# Patient Record
Sex: Male | Born: 1955 | Race: White | Hispanic: No | Marital: Single | Smoking: Current every day smoker
Health system: Southern US, Community
[De-identification: ages and names within clinical notes are randomized; demographics above are authoritative.]

## PROBLEM LIST (undated history)

## (undated) DIAGNOSIS — C801 Malignant (primary) neoplasm, unspecified: Secondary | ICD-10-CM

## (undated) HISTORY — PX: NO PAST SURGERIES: SHX2092

---

## 2018-11-29 ENCOUNTER — Other Ambulatory Visit: Payer: Self-pay

## 2018-11-29 ENCOUNTER — Emergency Department (HOSPITAL_COMMUNITY): Payer: Self-pay

## 2018-11-29 ENCOUNTER — Encounter (HOSPITAL_COMMUNITY): Payer: Self-pay

## 2018-11-29 ENCOUNTER — Inpatient Hospital Stay (HOSPITAL_COMMUNITY)
Admission: EM | Admit: 2018-11-29 | Discharge: 2018-12-02 | DRG: 436 | Disposition: A | Discharge status: Home / Self Care | Payer: Self-pay | Attending: Internal Medicine | Admitting: Internal Medicine

## 2018-11-29 DIAGNOSIS — N179 Acute kidney failure, unspecified: Secondary | ICD-10-CM | POA: Diagnosis present

## 2018-11-29 DIAGNOSIS — R918 Other nonspecific abnormal finding of lung field: Secondary | ICD-10-CM | POA: Diagnosis present

## 2018-11-29 DIAGNOSIS — C220 Liver cell carcinoma: Secondary | ICD-10-CM | POA: Diagnosis present

## 2018-11-29 DIAGNOSIS — K7030 Alcoholic cirrhosis of liver without ascites: Secondary | ICD-10-CM | POA: Diagnosis present

## 2018-11-29 DIAGNOSIS — C7951 Secondary malignant neoplasm of bone: Secondary | ICD-10-CM

## 2018-11-29 DIAGNOSIS — C782 Secondary malignant neoplasm of pleura: Secondary | ICD-10-CM | POA: Diagnosis present

## 2018-11-29 DIAGNOSIS — F1010 Alcohol abuse, uncomplicated: Secondary | ICD-10-CM | POA: Diagnosis present

## 2018-11-29 DIAGNOSIS — C799 Secondary malignant neoplasm of unspecified site: Secondary | ICD-10-CM

## 2018-11-29 DIAGNOSIS — R1013 Epigastric pain: Secondary | ICD-10-CM

## 2018-11-29 DIAGNOSIS — N183 Chronic kidney disease, stage 3 (moderate): Secondary | ICD-10-CM | POA: Diagnosis present

## 2018-11-29 DIAGNOSIS — F1011 Alcohol abuse, in remission: Secondary | ICD-10-CM | POA: Diagnosis present

## 2018-11-29 DIAGNOSIS — K769 Liver disease, unspecified: Secondary | ICD-10-CM

## 2018-11-29 DIAGNOSIS — C771 Secondary and unspecified malignant neoplasm of intrathoracic lymph nodes: Secondary | ICD-10-CM | POA: Diagnosis present

## 2018-11-29 DIAGNOSIS — C229 Malignant neoplasm of liver, not specified as primary or secondary: Principal | ICD-10-CM | POA: Diagnosis present

## 2018-11-29 DIAGNOSIS — C787 Secondary malignant neoplasm of liver and intrahepatic bile duct: Secondary | ICD-10-CM

## 2018-11-29 DIAGNOSIS — F17210 Nicotine dependence, cigarettes, uncomplicated: Secondary | ICD-10-CM | POA: Diagnosis present

## 2018-11-29 DIAGNOSIS — K5900 Constipation, unspecified: Secondary | ICD-10-CM | POA: Diagnosis present

## 2018-11-29 DIAGNOSIS — E876 Hypokalemia: Secondary | ICD-10-CM | POA: Diagnosis present

## 2018-11-29 HISTORY — DX: Malignant (primary) neoplasm, unspecified: C80.1

## 2018-11-29 LAB — CBC
HCT: 44.1 % (ref 39.0–52.0)
Hemoglobin: 14.6 g/dL (ref 13.0–17.0)
MCH: 31.3 pg (ref 26.0–34.0)
MCHC: 33.1 g/dL (ref 30.0–36.0)
MCV: 94.4 fL (ref 80.0–100.0)
Platelets: 191 10*3/uL (ref 150–400)
RBC: 4.67 MIL/uL (ref 4.22–5.81)
RDW: 12 % (ref 11.5–15.5)
WBC: 6.3 10*3/uL (ref 4.0–10.5)
nRBC: 0 % (ref 0.0–0.2)

## 2018-11-29 LAB — COMPREHENSIVE METABOLIC PANEL
ALT: 101 U/L — ABNORMAL HIGH (ref 0–44)
ANION GAP: 11 (ref 5–15)
AST: 94 U/L — ABNORMAL HIGH (ref 15–41)
Albumin: 3.7 g/dL (ref 3.5–5.0)
Alkaline Phosphatase: 286 U/L — ABNORMAL HIGH (ref 38–126)
BUN: 24 mg/dL — ABNORMAL HIGH (ref 8–23)
CO2: 23 mmol/L (ref 22–32)
Calcium: 9.4 mg/dL (ref 8.9–10.3)
Chloride: 101 mmol/L (ref 98–111)
Creatinine, Ser: 1.32 mg/dL — ABNORMAL HIGH (ref 0.61–1.24)
GFR calc Af Amer: >60 mL/min (ref >60)
GFR calc non Af Amer: 57 mL/min — ABNORMAL LOW (ref >60)
Glucose, Bld: 106 mg/dL — ABNORMAL HIGH (ref 70–99)
Potassium: 3.4 mmol/L — ABNORMAL LOW (ref 3.5–5.1)
Sodium: 135 mmol/L (ref 135–145)
Total Bilirubin: 1.3 mg/dL — ABNORMAL HIGH (ref 0.3–1.2)
Total Protein: 7.1 g/dL (ref 6.5–8.1)

## 2018-11-29 LAB — URINALYSIS, ROUTINE W REFLEX MICROSCOPIC
Bacteria, UA: NONE SEEN
Bilirubin Urine: NEGATIVE
Glucose, UA: 50 mg/dL — AB
HGB URINE DIPSTICK: NEGATIVE
Ketones, ur: NEGATIVE mg/dL
LEUKOCYTE UA: NEGATIVE
Nitrite: NEGATIVE
Protein, ur: 100 mg/dL — AB
Specific Gravity, Urine: 1.015 (ref 1.005–1.030)
pH: 5 (ref 5.0–8.0)

## 2018-11-29 LAB — CBC WITH DIFFERENTIAL/PLATELET
Abs Immature Granulocytes: 0.01 10*3/uL (ref 0.00–0.07)
Basophils Absolute: 0 10*3/uL (ref 0.0–0.1)
Basophils Relative: 0 %
EOS ABS: 0 10*3/uL (ref 0.0–0.5)
EOS PCT: 1 %
HEMATOCRIT: 45.1 % (ref 39.0–52.0)
Hemoglobin: 15 g/dL (ref 13.0–17.0)
Immature Granulocytes: 0 %
Lymphocytes Relative: 17 %
Lymphs Abs: 1.1 10*3/uL (ref 0.7–4.0)
MCH: 31.3 pg (ref 26.0–34.0)
MCHC: 33.3 g/dL (ref 30.0–36.0)
MCV: 94.2 fL (ref 80.0–100.0)
Monocytes Absolute: 0.7 10*3/uL (ref 0.1–1.0)
Monocytes Relative: 11 %
Neutro Abs: 4.6 10*3/uL (ref 1.7–7.7)
Neutrophils Relative %: 71 %
Platelets: 187 10*3/uL (ref 150–400)
RBC: 4.79 MIL/uL (ref 4.22–5.81)
RDW: 11.9 % (ref 11.5–15.5)
WBC: 6.5 10*3/uL (ref 4.0–10.5)
nRBC: 0 % (ref 0.0–0.2)

## 2018-11-29 LAB — CREATININE, SERUM
Creatinine, Ser: 1.25 mg/dL — ABNORMAL HIGH (ref 0.61–1.24)
GFR calc Af Amer: >60 mL/min (ref >60)
GFR calc non Af Amer: >60 mL/min (ref >60)

## 2018-11-29 LAB — LIPASE, BLOOD: LIPASE: 146 U/L — AB (ref 11–51)

## 2018-11-29 MED ORDER — OXYCODONE HCL 5 MG PO TABS
5.0000 mg | ORAL_TABLET | ORAL | Status: DC | PRN
  Administered 2018-11-30 (×2): 5 mg via ORAL
  Filled 2018-11-29 (×3): qty 1

## 2018-11-29 MED ORDER — POLYETHYLENE GLYCOL 3350 17 G PO PACK: 17.0000 g | PACK | Freq: Every day | ORAL | Status: DC | PRN

## 2018-11-29 MED ORDER — TRAZODONE HCL 50 MG PO TABS: 50.0000 mg | ORAL_TABLET | Freq: Every evening | ORAL | Status: DC | PRN

## 2018-11-29 MED ORDER — POTASSIUM CHLORIDE CRYS ER 20 MEQ PO TBCR
20.0000 meq | EXTENDED_RELEASE_TABLET | Freq: Once | ORAL | Status: AC
  Administered 2018-11-29: 20 meq via ORAL
  Filled 2018-11-29: qty 1

## 2018-11-29 MED ORDER — IOHEXOL 300 MG/ML  SOLN
100.0000 mL | Freq: Once | INTRAMUSCULAR | Status: AC | PRN
  Administered 2018-11-29: 100 mL via INTRAVENOUS

## 2018-11-29 MED ORDER — LACTATED RINGERS IV SOLN
INTRAVENOUS | Status: DC
  Administered 2018-11-29 – 2018-12-02 (×5): via INTRAVENOUS

## 2018-11-29 MED ORDER — SODIUM CHLORIDE 0.9 % IV BOLUS
1000.0000 mL | Freq: Once | INTRAVENOUS | Status: AC
  Administered 2018-11-29: 1000 mL via INTRAVENOUS

## 2018-11-29 MED ORDER — MORPHINE SULFATE (PF) 4 MG/ML IV SOLN
4.0000 mg | Freq: Once | INTRAVENOUS | Status: AC
  Administered 2018-11-29: 4 mg via INTRAVENOUS
  Filled 2018-11-29: qty 1

## 2018-11-29 MED ORDER — SODIUM CHLORIDE 0.9 % IV SOLN
INTRAVENOUS | Status: DC
  Administered 2018-11-29: 12:00:00 via INTRAVENOUS

## 2018-11-29 MED ORDER — MORPHINE SULFATE (PF) 2 MG/ML IV SOLN
1.0000 mg | INTRAVENOUS | Status: DC | PRN
  Administered 2018-11-29: 3 mg via INTRAVENOUS
  Administered 2018-11-30 – 2018-12-01 (×4): 2 mg via INTRAVENOUS
  Administered 2018-12-01 (×3): 3 mg via INTRAVENOUS
  Administered 2018-12-01 (×2): 2 mg via INTRAVENOUS
  Administered 2018-12-01 – 2018-12-02 (×5): 3 mg via INTRAVENOUS
  Filled 2018-11-29 (×2): qty 2
  Filled 2018-11-29: qty 1
  Filled 2018-11-29 (×2): qty 2
  Filled 2018-11-29: qty 1
  Filled 2018-11-29 (×2): qty 2
  Filled 2018-11-29: qty 1
  Filled 2018-11-29: qty 2
  Filled 2018-11-29: qty 1
  Filled 2018-11-29 (×2): qty 2
  Filled 2018-11-29 (×2): qty 1

## 2018-11-29 MED ORDER — ONDANSETRON HCL 4 MG/2ML IJ SOLN
4.0000 mg | Freq: Four times a day (QID) | INTRAMUSCULAR | Status: DC | PRN
  Administered 2018-11-30 – 2018-12-01 (×3): 4 mg via INTRAVENOUS
  Filled 2018-11-29 (×3): qty 2

## 2018-11-29 MED ORDER — ENOXAPARIN SODIUM 40 MG/0.4ML ~~LOC~~ SOLN
40.0000 mg | SUBCUTANEOUS | Status: DC
  Administered 2018-11-29 – 2018-12-01 (×3): 40 mg via SUBCUTANEOUS
  Filled 2018-11-29 (×3): qty 0.4

## 2018-11-29 MED ORDER — ONDANSETRON HCL 4 MG PO TABS: 4.0000 mg | ORAL_TABLET | Freq: Four times a day (QID) | ORAL | Status: DC | PRN

## 2018-11-29 MED ORDER — ONDANSETRON HCL 4 MG/2ML IJ SOLN
4.0000 mg | Freq: Once | INTRAMUSCULAR | Status: AC
  Administered 2018-11-29: 4 mg via INTRAVENOUS
  Filled 2018-11-29: qty 2

## 2018-11-29 NOTE — ED Notes (Signed)
Patient is resting comfortably. 

## 2018-11-29 NOTE — ED Notes (Signed)
Pt walking around unit,

## 2018-11-29 NOTE — H&P (Signed)
History and Physical    Levie Wages Schorsch NFA:[id number] DOB: 1956-03-20 DOA: 11/29/2018  PCP: Patient, No Pcp Per Patient coming from: Home  Chief Complaint: Abd pain  HPI: Cody Montoya is a 63 y.o. male with medical history significant of alcohol abuse.  He does not have a primary care provider and does not seek routine medical care.  Around 3 weeks ago, the patient started having epigastric abdominal pain.  It would wax and wane in severity but progressively worsened since onset.  He has had associated nausea and vomiting.  His appetite has decreased and he is lost around 10 pounds.  He stopped drinking when this happened.  He would drink around 7-8 shots of vodka daily in addition to the 2-3 beers.  He has done this for the past 4-5 years.  He does not have any known history of hepatitis B or C and denies taking any medication/supplements routinely.  He denies any prominent vessels, yellowing of the skin/eyes, fevers, shortness of breath, or bowel changes.  The patient has been using ibuprofen for pain with little relief.  ED Course: CT scan shows multifocal hepatocellular carcinoma that is not appear to be metastatic to the liver.  There is also thoracic adenopathy, omental/peritoneal involvement and left pleural effusion suggestive of metastatic disease.  The oncology team was called and recommended admission for work-up.  No mild hypokalemia, elevated liver enzymes and alk phos, and GFR in the stage III range.  Review of Systems: As per HPI otherwise 10 point review of systems negative.   History reviewed. No pertinent past medical history.  History reviewed. No pertinent surgical history.   reports that he has been smoking cigarettes. He has a 45.00 pack-year smoking history. He has never used smokeless tobacco. He reports previous alcohol use. He reports previous drug use.  No Known Allergies  History reviewed. No pertinent family history.   Prior to Admission  medications   Medication Sig Start Date End Date Taking? Authorizing Provider  ibuprofen (ADVIL,MOTRIN) 200 MG tablet Take 800 mg by mouth every 6 (six) hours as needed for fever or moderate pain.   Yes [provider]    Physical Exam: Vitals:   11/29/18 1245 11/29/18 1300 11/29/18 1315 11/29/18 1530  BP: 136/69 135/66 138/70 117/90  Pulse: 66 68 62 67  Resp: (!) 22 19 (!) 24 12  Temp:      TempSrc:      SpO2: 96% 97% 97% 98%  Weight:      Height:       Constitutional: NAD, calm, comfortable Eyes: PERRL, lids and conjunctivae normal ENMT: Mucous membranes are moist. Posterior pharynx clear of any exudate or lesions.Normal dentition.  Neck: normal, supple, no masses, no thyromegaly Respiratory: clear to auscultation bilaterally, no wheezing, no crackles. Normal respiratory effort. No accessory muscle use.  Cardiovascular: RRR. No extremity edema. 2+ pedal pulses. No carotid bruits.  Abdomen: + Epigastric tenderness, mild distention, no masses palpated. No hepatosplenomegaly, though he had difficulty tolerating deeper palpation.  Bowel sounds positive.  Musculoskeletal: no clubbing / cyanosis. No joint deformity upper and lower extremities. Good ROM, no contractures. Normal muscle tone.  Skin: no rashes, lesions, ulcers, icterus.  Neurologic: CN 2-12 grossly intact. Sensation intact, DTR normal. Strength 5/5 in all 4.  Psychiatric: Normal judgment and insight. Alert and oriented x 3. Normal mood.   Labs on Admission: I have personally reviewed following labs and imaging studies  CBC: Recent Labs  Lab 11/29/18 0955  WBC  6.5  NEUTROABS 4.6  HGB 15.0  HCT 45.1  MCV 94.2  PLT 784   Basic Metabolic Panel: Recent Labs  Lab 11/29/18 0955  NA 135  K 3.4*  CL 101  CO2 23  GLUCOSE 106*  BUN 24*  CREATININE 1.32*  CALCIUM 9.4   GFR: Estimated Creatinine Clearance: 67.2 mL/min (A) (by C-G formula based on SCr of 1.32 mg/dL (H)). Liver Function Tests: Recent Labs    Lab 11/29/18 0955  AST 94*  ALT 101*  ALKPHOS 286*  BILITOT 1.3*  PROT 7.1  ALBUMIN 3.7   Recent Labs  Lab 11/29/18 0955  LIPASE 146*   Urine analysis:    Component Value Date/Time   COLORURINE YELLOW 11/29/2018 0957   APPEARANCEUR CLEAR 11/29/2018 0957   LABSPEC 1.015 11/29/2018 0957   PHURINE 5.0 11/29/2018 0957   GLUCOSEU 50 (A) 11/29/2018 0957   HGBUR NEGATIVE 11/29/2018 0957   BILIRUBINUR NEGATIVE 11/29/2018 0957   KETONESUR NEGATIVE 11/29/2018 0957   PROTEINUR 100 (A) 11/29/2018 0957   NITRITE NEGATIVE 11/29/2018 0957   LEUKOCYTESUR NEGATIVE 11/29/2018 0957   Radiological Exams on Admission: Ct Abdomen Pelvis W Contrast  Result Date: 11/29/2018 CLINICAL DATA:  Upper abdominal pain for several weeks. Smoker. No history of primary malignancy per the medical record. EXAM: CT ABDOMEN AND PELVIS WITH CONTRAST TECHNIQUE: Multidetector CT imaging of the abdomen and pelvis was performed using the standard protocol following bolus administration of intravenous contrast. CONTRAST:  112m OMNIPAQUE IOHEXOL 300 MG/ML  SOLN COMPARISON:  None. FINDINGS: Lower chest: Clear lung bases. Tiny left pleural effusion with pleural irregularity, most apparent on image 3/4. Normal heart size with right coronary artery atherosclerosis. Hepatobiliary: Hepatomegaly at 21.6 cm craniocaudal. Irregular hepatic capsule with caudate and lateral segment left liver lobe enlargement. Heterogeneous density throughout the liver, with more well-defined hypoattenuating lesions including in the high left hepatic lobe at 2.1 cm on image 15/3 and in the central right hepatic lobe at 1.8 cm on the same image. The gallbladder is contracted and appears thick walled. No pericholecystic edema. No biliary duct dilatation. Pancreas: Normal, without mass or ductal dilatation. Spleen: Normal in size, without focal abnormality. Adrenals/Urinary Tract: Left greater than right adrenal thickening. Too small to characterize lesions  in both kidneys. No hydronephrosis. Normal urinary bladder. Stomach/Bowel: Normal stomach, without wall thickening. Scattered colonic diverticula. Normal terminal ileum and appendix. Normal small bowel. Vascular/Lymphatic: Aortic and branch vessel atherosclerosis. Patent portal and splenic veins. No specific evidence of portal venous hypertension. Extensive abdominal adenopathy. Index aortocaval node of 2.2 cm on image 36/3. Porta hepatis adenopathy at 2.4 cm on image 28/3. Retrocrural adenopathy at 1.1 cm on image 20/3. No pelvic sidewall adenopathy. Reproductive: Normal prostate. Other: Small volume abdominopelvic ascites. Perihepatic nodularity may represent small lymph nodes or omental metastasis including at 9 mm on image 43/3. Right inguinal hernia contains fat. Musculoskeletal: Anterior left seventh rib nonacute fracture. Lower thoracic spondylosis. IMPRESSION: 1. 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 Widespread metastatic disease from an otherwise unknown primary is possible but felt less likely. 2. Upper abdominal and lower thoracic adenopathy, most consistent with metastatic disease. 3. Small left pleural effusion with findings suspicious for pleural metastasis. 4. Probable perihepatic omental/peritoneal metastasis. 5. Coronary artery atherosclerosis. Aortic Atherosclerosis (ICD10-I70.0). 6. Small volume abdominopelvic ascites. Electronically Signed   By: KAbigail MiyamotoM.D.   On: 11/29/2018 14:34    EKG: Independently reviewed.  Assessment/Plan Principal Problem:   Hepatocellular carcinoma ([place]  Oncology appreciated  Zofran for nausea  Oxycodone and morphine for moderate and severe pain  Gentle hydration due to poor oral intake and possible increase in creatinine (no baseline)  Ck HIV, Hep C, Hep B today  Active Problems:   History of alcohol abuse  Encourage continued cessation   Hypokalemia  Replace, check BMP in the  morning   DVT prophylaxis: Lovenox Code Status: Full Family Communication: Self Disposition Plan: Home Consults called: Oncology, Dr. Irene Limbo contacted by emergency department team Admission status: Inpatient   Severity of Illness: The appropriate patient status for this patient is INPATIENT. Inpatient status is judged to be reasonable and necessary in order to provide the required intensity of service to ensure the patient's safety. The patient's presenting symptoms, physical exam findings, and initial radiographic and laboratory data in the context of their chronic comorbidities is felt to place them at high risk for further clinical deterioration. Furthermore, it is not anticipated that the patient will be medically stable for discharge from the hospital within 2 midnights of admission. The following factors support the patient status of inpatient.   " The patient's presenting symptoms include abdominal pain. " The worrisome physical exam findings include epigastric abd pain. " The initial radiographic and laboratory data are worrisome because of CT findings suggestive of [place]. " The chronic co-morbidities include history of alcohol abuse.   * I certify that at the point of admission it is my clinical judgment that the patient will require inpatient hospital care spanning beyond 2 midnights from the point of admission due to high intensity of service, high risk for further deterioration and high frequency of surveillance required.*   Shelda Pal, DO [facility] [email or web address] 11/29/2018, 5:09 PM

## 2018-11-29 NOTE — ED Notes (Signed)
Returned from CT.

## 2018-11-29 NOTE — ED Notes (Signed)
Pt back from CT

## 2018-11-29 NOTE — ED Provider Notes (Addendum)
[facility]GENCY DEPARTMENT Provider Note   CSN: [id number] Arrival date & time: 11/29/18  8250    History   Chief Complaint Chief Complaint  Patient presents with  . Abdominal Pain  . Nausea    follows pain    HPI Cody Montoya is a 63 y.o. male.  HPI Patient presents to the emergency room for evaluation of abdominal pain.  Patient states for the last 3 weeks he has had pain in his upper abdomen.  It is in the epigastric region and occasionally moves up towards his chest.  It does seem to get worse sometimes when he tries to eat.  His appetite has not been as good.  Patient's had some nausea and vomiting with it.  He has had dry heaves at least a couple of times per day.  He denies any diarrhea but has felt constipated.  Stop drinking alcohol when this first started but that did not seem to help.  He has not noticed any blood in his stool or dark black stools.  He denies any urinary symptoms.  No fevers.  Because of the persistent symptoms the patient decided to come to the ER today to be evaluated. History reviewed. No pertinent past medical history.  There are no active problems to display for this patient.   History reviewed. No pertinent surgical history.      Home Medications    Prior to Admission medications   Not on File    Family History History reviewed. No pertinent family history.  Social History Social History   Tobacco Use  . Smoking status: Current Every Day Smoker    Packs/day: 1.00    Years: 45.00    Pack years: 45.00    Types: Cigarettes  . Smokeless tobacco: Never Used  Substance Use Topics  . Alcohol use: Not Currently  . Drug use: Not Currently     Allergies   Patient has no allergy information on record.   Review of Systems Review of Systems  All other systems reviewed and are negative.    Physical Exam Updated Vital Signs BP 117/90   Pulse 67   Temp 97.8 F (36.6 C) (Oral)   Resp 12   Ht  1.778 m (5\' 10" )   Wt 95.3 kg   SpO2 98%   BMI 30.13 kg/m   Physical Exam Vitals signs and nursing note reviewed.  Constitutional:      General: He is not in acute distress.    Appearance: He is well-developed.  HENT:     Head: Normocephalic and atraumatic.     Right Ear: External ear normal.     Left Ear: External ear normal.  Eyes:     General: No scleral icterus.       Right eye: No discharge.        Left eye: No discharge.     Conjunctiva/sclera: Conjunctivae normal.  Neck:     Musculoskeletal: Neck supple.     Trachea: No tracheal deviation.  Cardiovascular:     Rate and Rhythm: Normal rate and regular rhythm.  Pulmonary:     Effort: Pulmonary effort is normal. No respiratory distress.     Breath sounds: Normal breath sounds. No stridor. No wheezing or rales.  Abdominal:     General: Bowel sounds are normal. There is no distension.     Palpations: Abdomen is soft. There is mass.     Tenderness: There is abdominal tenderness in the epigastric area. There  is guarding. There is no rebound.  Musculoskeletal:        General: No tenderness.  Skin:    General: Skin is warm and dry.     Findings: No rash.  Neurological:     Mental Status: He is alert.     Cranial Nerves: No cranial nerve deficit (no facial droop, extraocular movements intact, no slurred speech).     Sensory: No sensory deficit.     Motor: No abnormal muscle tone or seizure activity.     Coordination: Coordination normal.      ED Treatments / Results  Labs (all labs ordered are listed, but only abnormal results are displayed) Labs Reviewed  COMPREHENSIVE METABOLIC PANEL - Abnormal; Notable for the following components:      Result Value   Potassium 3.4 (*)    Glucose, Bld 106 (*)    BUN 24 (*)    Creatinine, Ser 1.32 (*)    AST 94 (*)    ALT 101 (*)    Alkaline Phosphatase 286 (*)    Total Bilirubin 1.3 (*)    GFR calc non Af Amer 57 (*)    All other components within normal limits  LIPASE,  BLOOD - Abnormal; Notable for the following components:   Lipase 146 (*)    All other components within normal limits  URINALYSIS, ROUTINE W REFLEX MICROSCOPIC - Abnormal; Notable for the following components:   Glucose, UA 50 (*)    Protein, ur 100 (*)    All other components within normal limits  CBC WITH DIFFERENTIAL/PLATELET    EKG EKG Interpretation  Date/Time:  Saturday November 29 2018 09:52:35 EST Ventricular Rate:  98 PR Interval:    QRS Duration: 92 QT Interval:  357 QTC Calculation: 456 R Axis:   87 Text Interpretation:  Sinus rhythm Borderline right axis deviation No old tracing to compare Confirmed by Dorie Rank [phone]) on 11/29/2018 9:57:01 AM   Radiology Ct Abdomen Pelvis W Contrast  Result Date: 11/29/2018 CLINICAL DATA:  Upper abdominal pain for several weeks. Smoker. No history of primary malignancy per the medical record. EXAM: CT ABDOMEN AND PELVIS WITH CONTRAST TECHNIQUE: Multidetector CT imaging of the abdomen and pelvis was performed using the standard protocol following bolus administration of intravenous contrast. CONTRAST:  131mL OMNIPAQUE IOHEXOL 300 MG/ML  SOLN COMPARISON:  None. FINDINGS: Lower chest: Clear lung bases. Tiny left pleural effusion with pleural irregularity, most apparent on image 3/4. Normal heart size with right coronary artery atherosclerosis. Hepatobiliary: Hepatomegaly at 21.6 cm craniocaudal. Irregular hepatic capsule with caudate and lateral segment left liver lobe enlargement. Heterogeneous density throughout the liver, with more well-defined hypoattenuating lesions including in the high left hepatic lobe at 2.1 cm on image 15/3 and in the central right hepatic lobe at 1.8 cm on the same image. The gallbladder is contracted and appears thick walled. No pericholecystic edema. No biliary duct dilatation. Pancreas: Normal, without mass or ductal dilatation. Spleen: Normal in size, without focal abnormality. Adrenals/Urinary Tract: Left greater than  right adrenal thickening. Too small to characterize lesions in both kidneys. No hydronephrosis. Normal urinary bladder. Stomach/Bowel: Normal stomach, without wall thickening. Scattered colonic diverticula. Normal terminal ileum and appendix. Normal small bowel. Vascular/Lymphatic: Aortic and branch vessel atherosclerosis. Patent portal and splenic veins. No specific evidence of portal venous hypertension. Extensive abdominal adenopathy. Index aortocaval node of 2.2 cm on image 36/3. Porta hepatis adenopathy at 2.4 cm on image 28/3. Retrocrural adenopathy at 1.1 cm on image 20/3. No pelvic  sidewall adenopathy. Reproductive: Normal prostate. Other: Small volume abdominopelvic ascites. Perihepatic nodularity may represent small lymph nodes or omental metastasis including at 9 mm on image 43/3. Right inguinal hernia contains fat. Musculoskeletal: Anterior left seventh rib nonacute fracture. Lower thoracic spondylosis. IMPRESSION: 1. 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 Widespread metastatic disease from an otherwise unknown primary is possible but felt less likely. 2. Upper abdominal and lower thoracic adenopathy, most consistent with metastatic disease. 3. Small left pleural effusion with findings suspicious for pleural metastasis. 4. Probable perihepatic omental/peritoneal metastasis. 5. Coronary artery atherosclerosis. Aortic Atherosclerosis (ICD10-I70.0). 6. Small volume abdominopelvic ascites. Electronically Signed   By: Abigail Miyamoto M.D.   On: 11/29/2018 14:34    Procedures Procedures (including critical care time)  Medications Ordered in ED Medications  sodium chloride 0.9 % bolus 1,000 mL (0 mLs Intravenous Stopped 11/29/18 1157)    And  0.9 %  sodium chloride infusion ( Intravenous New Bag/Given 11/29/18 1203)  ondansetron (ZOFRAN) injection 4 mg (4 mg Intravenous Given 11/29/18 1022)  morphine 4 MG/ML injection 4 mg (4 mg Intravenous Given  11/29/18 1022)  iohexol (OMNIPAQUE) 300 MG/ML solution 100 mL (100 mLs Intravenous Contrast Given 11/29/18 1340)     Initial Impression / Assessment and Plan / ED Course  I have reviewed the triage vital signs and the nursing notes.  Pertinent labs & imaging results that were available during my care of the patient were reviewed by me and considered in my medical decision making (see chart for details).  Clinical Course as of Nov 29 1543  Sat Nov 29, 2018  1213 LFTs and lipase elevated.  Will ct to evaluate further   [JK]  1456 Discussed findings with patient.  Will call heme onc   [JK]  1519 D/w Dr. Irene Limbo.  Would recommend admission to expedite workup.  Will see pt in am   [JK]    Clinical Course User Index [JK] Dorie Rank, MD     Patient presented to the emergency room for evaluation of upper abdominal pain.  Patient's laboratory tests were notable for elevated LFTs.  CT scan unfortunately demonstrates findings most consistent with hepatocellular carcinoma.  Patient does not have a primary care doctor.  Discussed case with Dr. Irene Limbo who recommends admission for further work-up.  I will consult with the medical service  Final Clinical Impressions(s) / ED Diagnoses   Final diagnoses:  Metastatic cancer ([place])  Epigastric pain      Dorie Rank, MD 11/29/18 1545

## 2018-11-29 NOTE — ED Notes (Signed)
Patient transported to CT 

## 2018-11-29 NOTE — ED Triage Notes (Signed)
Pt experiencing abdominal pain for about 2-3 weeks. Nausea and vomiting following pain. Pain in the middle and radiates bilateral.

## 2018-11-30 DIAGNOSIS — C220 Liver cell carcinoma: Secondary | ICD-10-CM

## 2018-11-30 DIAGNOSIS — N183 Chronic kidney disease, stage 3 (moderate): Secondary | ICD-10-CM

## 2018-11-30 DIAGNOSIS — E876 Hypokalemia: Secondary | ICD-10-CM

## 2018-11-30 DIAGNOSIS — F1011 Alcohol abuse, in remission: Secondary | ICD-10-CM

## 2018-11-30 LAB — HEPATITIS B SURFACE ANTIBODY, QUANTITATIVE: Hep B S AB Quant (Post): <3.1 m[IU]/mL — ABNORMAL LOW (ref >9.9)

## 2018-11-30 LAB — HEPATITIS C ANTIBODY: HCV Ab: <0.1 s/co ratio (ref 0.0–0.9)

## 2018-11-30 LAB — HIV ANTIBODY (ROUTINE TESTING W REFLEX): HIV Screen 4th Generation wRfx: NONREACTIVE

## 2018-11-30 LAB — HEPATITIS B SURFACE ANTIGEN: Hepatitis B Surface Ag: NEGATIVE

## 2018-11-30 MED ORDER — NICOTINE 21 MG/24HR TD PT24
21.0000 mg | MEDICATED_PATCH | Freq: Every day | TRANSDERMAL | Status: DC
  Administered 2018-11-30: 21 mg via TRANSDERMAL
  Filled 2018-11-30 (×3): qty 1

## 2018-11-30 NOTE — Progress Notes (Signed)
PROGRESS NOTE    Cody Montoya [facility]  IDP:[id number] DOB: 27-Sep-1955 DOA: 11/29/2018 PCP: Patient, No Pcp Per   Brief Narrative:  Is a pleasant 63 year old white male with a past no history notable for alcohol abuse.  Patient does not follow with primary care provider and does not seek routine medical care.  He did report daily alcohol abuse with cessation approximately 1 month ago secondary to profound and persistent epigastric abdominal pain.  Because of this persistent pain he presented to the ER for further eval and treatment.  In the ER: CT scan was concerning for multifocal hepatocellular carcinoma, thoracic adenopathy, omental and peritoneal involvement, left pleural effusion suggestive of metastatic disease.  Oncology team was called and consulted with recommendation for admission for further work-up.   Assessment & Plan:   Principal Problem:   Hepatocellular carcinoma ([place]) Active Problems:   History of alcohol abuse   Hypokalemia   Possible hepatocellular carcinoma. Patient will be seen by oncology in consultation, will continue with symptomatic treatment for nausea, pain management oxycodone and warfarin.  Continue gentle hydration.  Follow-up on HIV hep C and hep B.  I will add AFP.  Alcohol abuse.  Patient reports last drink 1 month ago.  No evidence of withdrawals.  We will continue to monitor, symptomatic treatment, encourage continued cessation  Hypokalemia.  Repleted, monitor daily.  CKD stage III.  Monitor BMP daily.  Unknown if this is chronic or acute as patient does not follow with primary care physicians.  Patient will remain inpatient for further evaluation, diagnosis and treatment of probable liver cancer requiring expert consultation.  DVT prophylaxis: Lovenox SQ  Code Status: Full code    Code Status Orders  (From admission, onward)         Start     Ordered   11/29/18 1701  Full code  Continuous     11/29/18 1702        Code Status History      This patient has a current code status but no historical code status.     Family Communication: None present Disposition Plan:   Patient remained inpatient for further evaluation and expert consultation for diagnosis and treatment of liver cancer as well as pain management requiring IV analgesics Consults called: None today Admission status: Inpatient   Consultants:   Oncology by admitting team  Procedures:  Ct Abdomen Pelvis W Contrast  Result Date: 11/29/2018 CLINICAL DATA:  Upper abdominal pain for several weeks. Smoker. No history of primary malignancy per the medical record. EXAM: CT ABDOMEN AND PELVIS WITH CONTRAST TECHNIQUE: Multidetector CT imaging of the abdomen and pelvis was performed using the standard protocol following bolus administration of intravenous contrast. CONTRAST:  158mL OMNIPAQUE IOHEXOL 300 MG/ML  SOLN COMPARISON:  None. FINDINGS: Lower chest: Clear lung bases. Tiny left pleural effusion with pleural irregularity, most apparent on image 3/4. Normal heart size with right coronary artery atherosclerosis. Hepatobiliary: Hepatomegaly at 21.6 cm craniocaudal. Irregular hepatic capsule with caudate and lateral segment left liver lobe enlargement. Heterogeneous density throughout the liver, with more well-defined hypoattenuating lesions including in the high left hepatic lobe at 2.1 cm on image 15/3 and in the central right hepatic lobe at 1.8 cm on the same image. The gallbladder is contracted and appears thick walled. No pericholecystic edema. No biliary duct dilatation. Pancreas: Normal, without mass or ductal dilatation. Spleen: Normal in size, without focal abnormality. Adrenals/Urinary Tract: Left greater than right adrenal thickening. Too small to characterize lesions in both kidneys.  No hydronephrosis. Normal urinary bladder. Stomach/Bowel: Normal stomach, without wall thickening. Scattered colonic diverticula. Normal terminal ileum and appendix. Normal small bowel.  Vascular/Lymphatic: Aortic and branch vessel atherosclerosis. Patent portal and splenic veins. No specific evidence of portal venous hypertension. Extensive abdominal adenopathy. Index aortocaval node of 2.2 cm on image 36/3. Porta hepatis adenopathy at 2.4 cm on image 28/3. Retrocrural adenopathy at 1.1 cm on image 20/3. No pelvic sidewall adenopathy. Reproductive: Normal prostate. Other: Small volume abdominopelvic ascites. Perihepatic nodularity may represent small lymph nodes or omental metastasis including at 9 mm on image 43/3. Right inguinal hernia contains fat. Musculoskeletal: Anterior left seventh rib nonacute fracture. Lower thoracic spondylosis. IMPRESSION: 1. 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 Widespread metastatic disease from an otherwise unknown primary is possible but felt less likely. 2. Upper abdominal and lower thoracic adenopathy, most consistent with metastatic disease. 3. Small left pleural effusion with findings suspicious for pleural metastasis. 4. Probable perihepatic omental/peritoneal metastasis. 5. Coronary artery atherosclerosis. Aortic Atherosclerosis (ICD10-I70.0). 6. Small volume abdominopelvic ascites. Electronically Signed   By: Abigail Miyamoto M.D.   On: 11/29/2018 14:34     Antimicrobials:   None   Subjective: Patient resting comfortably in bed, no evidence of withdrawals, reports persistent abdominal pain although well controlled with as needed treatment.  Objective: Vitals:   11/29/18 1530 11/29/18 1806 11/29/18 2142 11/30/18 0629  BP: 117/90 (!) 153/96 (!) 157/86 (!) 142/61  Pulse: 67 66 77 67  Resp: 12 17 18 19   Temp:  98.2 F (36.8 C) 98.4 F (36.9 C) 98 F (36.7 C)  TempSrc:  Oral Oral Oral  SpO2: 98% 98% 95% 96%  Weight:      Height:        Intake/Output Summary (Last 24 hours) at 11/30/2018 1210 Last data filed at 11/30/2018 0831 Gross per 24 hour  Intake 1545.37 ml  Output 525 ml    Net 1020.37 ml   Filed Weights   11/29/18 1003  Weight: 95.3 kg    Examination:  General exam: Appears calm and comfortable  Respiratory system: Clear to auscultation. Respiratory effort normal. Cardiovascular system: S1 & S2 heard, RRR. No JVD, murmurs, rubs, gallops or clicks. No pedal edema. Gastrointestinal system: Quiet bowel sounds, tender to palpation right upper quadrant, unable to palpate thoroughly secondary to pain. Central nervous system: Alert and oriented. No focal neurological deficits. Extremities: Symmetric 5 x 5 power.  No edema Skin: No rashes, lesions or ulcers Psychiatry: Judgement and insight appear normal. Mood & affect appropriate.     Data Reviewed: I have personally reviewed following labs and imaging studies  CBC: Recent Labs  Lab 11/29/18 0955 11/29/18 1833  WBC 6.5 6.3  NEUTROABS 4.6  --   HGB 15.0 14.6  HCT 45.1 44.1  MCV 94.2 94.4  PLT 187 545   Basic Metabolic Panel: Recent Labs  Lab 11/29/18 0955 11/29/18 1833  NA 135  --   K 3.4*  --   CL 101  --   CO2 23  --   GLUCOSE 106*  --   BUN 24*  --   CREATININE 1.32* 1.25*  CALCIUM 9.4  --    GFR: Estimated Creatinine Clearance: 71 mL/min (A) (by C-G formula based on SCr of 1.25 mg/dL (H)). Liver Function Tests: Recent Labs  Lab 11/29/18 0955  AST 94*  ALT 101*  ALKPHOS 286*  BILITOT 1.3*  PROT 7.1  ALBUMIN 3.7   Recent Labs  Lab 11/29/18  8003  LIPASE 146*   No results for input(s): AMMONIA in the last 168 hours. Coagulation Profile: No results for input(s): INR, PROTIME in the last 168 hours. Cardiac Enzymes: No results for input(s): CKTOTAL, CKMB, CKMBINDEX, TROPONINI in the last 168 hours. BNP (last 3 results) No results for input(s): PROBNP in the last 8760 hours. HbA1C: No results for input(s): HGBA1C in the last 72 hours. CBG: No results for input(s): GLUCAP in the last 168 hours. Lipid Profile: No results for input(s): CHOL, HDL, LDLCALC, TRIG, CHOLHDL,  LDLDIRECT in the last 72 hours. Thyroid Function Tests: No results for input(s): TSH, T4TOTAL, FREET4, T3FREE, THYROIDAB in the last 72 hours. Anemia Panel: No results for input(s): VITAMINB12, FOLATE, FERRITIN, TIBC, IRON, RETICCTPCT in the last 72 hours. Sepsis Labs: No results for input(s): PROCALCITON, LATICACIDVEN in the last 168 hours.  No results found for this or any previous visit (from the past 240 hour(s)).       Radiology Studies: Ct Abdomen Pelvis W Contrast  Result Date: 11/29/2018 CLINICAL DATA:  Upper abdominal pain for several weeks. Smoker. No history of primary malignancy per the medical record. EXAM: CT ABDOMEN AND PELVIS WITH CONTRAST TECHNIQUE: Multidetector CT imaging of the abdomen and pelvis was performed using the standard protocol following bolus administration of intravenous contrast. CONTRAST:  123mL OMNIPAQUE IOHEXOL 300 MG/ML  SOLN COMPARISON:  None. FINDINGS: Lower chest: Clear lung bases. Tiny left pleural effusion with pleural irregularity, most apparent on image 3/4. Normal heart size with right coronary artery atherosclerosis. Hepatobiliary: Hepatomegaly at 21.6 cm craniocaudal. Irregular hepatic capsule with caudate and lateral segment left liver lobe enlargement. Heterogeneous density throughout the liver, with more well-defined hypoattenuating lesions including in the high left hepatic lobe at 2.1 cm on image 15/3 and in the central right hepatic lobe at 1.8 cm on the same image. The gallbladder is contracted and appears thick walled. No pericholecystic edema. No biliary duct dilatation. Pancreas: Normal, without mass or ductal dilatation. Spleen: Normal in size, without focal abnormality. Adrenals/Urinary Tract: Left greater than right adrenal thickening. Too small to characterize lesions in both kidneys. No hydronephrosis. Normal urinary bladder. Stomach/Bowel: Normal stomach, without wall thickening. Scattered colonic diverticula. Normal terminal ileum and  appendix. Normal small bowel. Vascular/Lymphatic: Aortic and branch vessel atherosclerosis. Patent portal and splenic veins. No specific evidence of portal venous hypertension. Extensive abdominal adenopathy. Index aortocaval node of 2.2 cm on image 36/3. Porta hepatis adenopathy at 2.4 cm on image 28/3. Retrocrural adenopathy at 1.1 cm on image 20/3. No pelvic sidewall adenopathy. Reproductive: Normal prostate. Other: Small volume abdominopelvic ascites. Perihepatic nodularity may represent small lymph nodes or omental metastasis including at 9 mm on image 43/3. Right inguinal hernia contains fat. Musculoskeletal: Anterior left seventh rib nonacute fracture. Lower thoracic spondylosis. IMPRESSION: 1. 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 Widespread metastatic disease from an otherwise unknown primary is possible but felt less likely. 2. Upper abdominal and lower thoracic adenopathy, most consistent with metastatic disease. 3. Small left pleural effusion with findings suspicious for pleural metastasis. 4. Probable perihepatic omental/peritoneal metastasis. 5. Coronary artery atherosclerosis. Aortic Atherosclerosis (ICD10-I70.0). 6. Small volume abdominopelvic ascites. Electronically Signed   By: Abigail Miyamoto M.D.   On: 11/29/2018 14:34        Scheduled Meds: . enoxaparin (LOVENOX) injection  40 mg Subcutaneous Q24H   Continuous Infusions: . sodium chloride 125 mL/hr at 11/29/18 1203  . lactated ringers 75 mL/hr at 11/30/18 0431  LOS: 1 day    Time spent: [place], MD [facility]  If 7PM-7AM, please contact night-coverage  11/30/2018, 12:10 PM

## 2018-11-30 NOTE — Progress Notes (Signed)
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 Recs- -CT chest to complete staging workup -AFP tumor marker -IR consultation for biopsy for tissue diagnosis - ? CT or US guided biopsy of liver lesion. -SW consultation for insurance issues, address barriers to care and ability to setup PCP and outpatient medical oncology f/u. -palliative care consultation to define goals of care. -medical oncology will f/u tomorrow.  -plz call if any other urgent questions.  Sullivan Lone MD MS

## 2018-12-01 ENCOUNTER — Inpatient Hospital Stay (HOSPITAL_COMMUNITY): Payer: Self-pay

## 2018-12-01 ENCOUNTER — Other Ambulatory Visit: Payer: Self-pay

## 2018-12-01 ENCOUNTER — Encounter (HOSPITAL_COMMUNITY): Payer: Self-pay | Admitting: Radiology

## 2018-12-01 LAB — COMPREHENSIVE METABOLIC PANEL
ALT: 96 U/L — ABNORMAL HIGH (ref 0–44)
AST: 91 U/L — ABNORMAL HIGH (ref 15–41)
Albumin: 3.1 g/dL — ABNORMAL LOW (ref 3.5–5.0)
Alkaline Phosphatase: 260 U/L — ABNORMAL HIGH (ref 38–126)
Anion gap: 10 (ref 5–15)
BILIRUBIN TOTAL: 1.5 mg/dL — AB (ref 0.3–1.2)
BUN: 13 mg/dL (ref 8–23)
CO2: 26 mmol/L (ref 22–32)
Calcium: 8.5 mg/dL — ABNORMAL LOW (ref 8.9–10.3)
Chloride: 101 mmol/L (ref 98–111)
Creatinine, Ser: 0.81 mg/dL (ref 0.61–1.24)
GFR calc Af Amer: >60 mL/min (ref >60)
GFR calc non Af Amer: >60 mL/min (ref >60)
Glucose, Bld: 79 mg/dL (ref 70–99)
POTASSIUM: 4 mmol/L (ref 3.5–5.1)
Sodium: 137 mmol/L (ref 135–145)
Total Protein: 6.4 g/dL — ABNORMAL LOW (ref 6.5–8.1)

## 2018-12-01 LAB — PROTIME-INR
INR: 1 (ref 0.8–1.2)
Prothrombin Time: 13.1 seconds (ref 11.4–15.2)

## 2018-12-01 LAB — AFP TUMOR MARKER: AFP, Serum, Tumor Marker: 3.3 ng/mL (ref 0.0–8.3)

## 2018-12-01 MED ORDER — LIDOCAINE HCL (PF) 1 % IJ SOLN
INTRAMUSCULAR | Status: AC
  Filled 2018-12-01: qty 30

## 2018-12-01 MED ORDER — ENSURE ENLIVE PO LIQD: 237.0000 mL | Freq: Two times a day (BID) | ORAL | Status: DC

## 2018-12-01 MED ORDER — GELATIN ABSORBABLE 12-7 MM EX MISC
CUTANEOUS | Status: AC
  Filled 2018-12-01: qty 1

## 2018-12-01 MED ORDER — FENTANYL CITRATE (PF) 100 MCG/2ML IJ SOLN
INTRAMUSCULAR | Status: AC | PRN
  Administered 2018-12-01: 25 ug via INTRAVENOUS
  Administered 2018-12-01: 50 ug via INTRAVENOUS

## 2018-12-01 MED ORDER — MIDAZOLAM HCL 2 MG/2ML IJ SOLN
INTRAMUSCULAR | Status: AC
  Filled 2018-12-01: qty 2

## 2018-12-01 MED ORDER — MIDAZOLAM HCL 2 MG/2ML IJ SOLN
INTRAMUSCULAR | Status: AC | PRN
  Administered 2018-12-01: 0.5 mg via INTRAVENOUS
  Administered 2018-12-01: 1 mg via INTRAVENOUS

## 2018-12-01 MED ORDER — FENTANYL CITRATE (PF) 100 MCG/2ML IJ SOLN
INTRAMUSCULAR | Status: AC
  Filled 2018-12-01: qty 2

## 2018-12-01 MED ORDER — IOHEXOL 300 MG/ML  SOLN
75.0000 mL | Freq: Once | INTRAMUSCULAR | Status: AC | PRN
  Administered 2018-12-01: 75 mL via INTRAVENOUS

## 2018-12-01 MED ORDER — ENOXAPARIN SODIUM 40 MG/0.4ML ~~LOC~~ SOLN: 40.0000 mg | SUBCUTANEOUS | Status: DC

## 2018-12-01 NOTE — Progress Notes (Signed)
Initial Nutrition Assessment  DOCUMENTATION CODES:   Obesity unspecified  INTERVENTION:   -RD will follow for diet advancement and supplement as appropriate  NUTRITION DIAGNOSIS:   Increased nutrient needs related to cancer and cancer related treatments as evidenced by estimated needs.  GOAL:   Patient will meet greater than or equal to 90% of their needs  MONITOR:   PO intake, Supplement acceptance, Diet advancement, Labs, Weight trends, Skin, I & O's  REASON FOR ASSESSMENT:   Malnutrition Screening Tool    ASSESSMENT:   Cody Montoya is a 63 y.o. male with medical history significant of alcohol abuse.  He does not have a primary care provider and does not seek routine medical care. Around 3 weeks ago, the patient started having epigastric abdominal pain.  It would wax and wane in severity but progressively worsened since onset.  He has had associated nausea and vomiting.  His appetite has decreased and he is lost around 10 pounds.  He stopped drinking when this happened.  He would drink around 7-8 shots of vodka daily in addition to the 2-3 beers.  He has done this for the past 4-5 years.  He does not have any known history of hepatitis B or C and denies taking any medication/supplements routinely.  He denies any prominent vessels, yellowing of the skin/eyes, fevers, shortness of breath, or bowel changes.  The patient has been using ibuprofen for pain with little relief.  Pt admitted with hepatocellular carcinoma.   Reviewed I/O's: +1.5 L x 24 hours and +3.3 since admission  Spoke with pt at bedside, who was in good spirits today. Pt endorses a general decline in health over the past 2-3 weeks secondary to progressive inability to keep down foods and liquids. Pt reports that he was only able to consume cereal, juice, soda, and water over this time period, but only small amounts secondary to abdominal pain when eating or "it would come right back up". Prior to these  symptoms, pt has a great appetite, consuming 3 meals per day (Breakfast: egg biscuit; Lunch: double cheeseburger or chicken nuggets; Dinner: meat, starch, and vegetable). Pt also reported ceasing alcohol cessation arouse 2-3 weeks ago in attempt to alleviate symptoms.   Pt reports increased weakness and fatigue. 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 Completing a full work day would often "burn me out" the the point that he would leave early of miss over over the past 3 days.   Pt reports UBW is around 220-230#, which he has maintained for most of his adult. He endorses wt loss secondary to decreased intake, however, unsure how much he may have lost. He has noticed "my stomach getting bigger and my arms getting smaller". Pt has lost about 5% of his UBW.   Discussed with pt rationale for NPO; pt shares he currently does not have much desire to eat. Discussed importance of good meal and supplement intake to promote healing once diet advanced. He is amenable to supplements once diet is advanced.  Labs reviewed: K: 3.4,  NUTRITION - FOCUSED PHYSICAL EXAM:    Most Recent Value  Orbital Region  No depletion  Upper Arm Region  No depletion  Thoracic and Lumbar Region  No depletion  Buccal Region  No depletion  Temple Region  No depletion  Clavicle Bone Region  No depletion  Clavicle and Acromion Bone Region  No depletion  Scapular Bone Region  No depletion  Dorsal Hand  No depletion  Patellar Region  No depletion  Anterior Thigh Region  No depletion  Posterior Calf Region  No depletion  Edema (RD Assessment)  None  Hair  Reviewed  Eyes  Reviewed  Mouth  Reviewed  Skin  Reviewed  Nails  Reviewed       Diet Order:   Diet Order            Diet NPO time specified  Diet effective now              EDUCATION NEEDS:   Education needs have been addressed  Skin:  Skin Assessment: Reviewed RN Assessment  Last BM:  11/30/18  Height:   Ht Readings from Last 1  Encounters:  11/29/18 5\' 10"  (1.778 m)    Weight:   Wt Readings from Last 1 Encounters:  11/29/18 95.3 kg    Ideal Body Weight:  75.5 kg  BMI:  Body mass index is 30.13 kg/m.  Estimated Nutritional Needs:   Kcal:  2050-2250  Protein:  105-120 grams  Fluid:  2.0-2.2 L    Cody Montoya A. Jimmye Norman, RD, LDN, [place] Registered Dietitian II Certified Diabetes Care and Education Specialist Pager: [phone] After hours Pager: [phone]

## 2018-12-01 NOTE — Progress Notes (Addendum)
Pt back from MRI, per MRI staff only took a couple of pictures was not able to finish it d/t pt in pain.

## 2018-12-01 NOTE — Progress Notes (Signed)
PROGRESS NOTE    Cody Montoya [facility]  PYK:[id number] DOB: 07-Feb-1956 DOA: 11/29/2018 PCP: Patient, No Pcp Per   Brief Narrative:  Is a pleasant 63 year old white male with a past no history notable for alcohol abuse.  Patient does not follow with primary care provider and does not seek routine medical care.  He did report daily alcohol abuse with cessation approximately 1 month ago secondary to profound and persistent epigastric abdominal pain.  Because of this persistent pain he presented to the ER for further eval and treatment.  In the ER: CT scan was concerning for multifocal hepatocellular carcinoma, thoracic adenopathy, omental and peritoneal involvement, left pleural effusion suggestive of metastatic disease.  Oncology team was called and consulted with recommendation for admission for further work-up.    Assessment & Plan:   Principal Problem:   Hepatocellular carcinoma ([place]) Active Problems:   History of alcohol abuse   Hypokalemia   Possible hepatocellular carcinoma. CT of chest pending for further staging, AFP pending,  negative HIV hep C and hep B.    Oncology following, IR consulted for tissue biopsy  Alcohol abuse.  Patient reported last drink 1 month ago.    Still with no evidence of withdrawals.  We will continue to monitor, symptomatic treatment, encourage continued cessation  Hypokalemia.  Repleted, monitor daily.  Acute kidney injury.    Improved with hydration, BUN of 13 and 0.8.  This was consistent with a prerenal azotemia.  Patient will remain inpatient for further evaluation, diagnosis and treatment of probable liver cancer requiring expert consultation with anticipated IR biopsy.  DVT prophylaxis: Lovenox SQ  Code Status: Full    Code Status Orders  (From admission, onward)         Start     Ordered   11/29/18 1701  Full code  Continuous     11/29/18 1702        Code Status History    This patient has a current code status but no  historical code status.     Family Communication: None present, expecting wife later this afternoon Disposition Plan:   Patient remained inpatient for further subspecialty expert care and evaluation to include interventional radiology for probable biopsy of liver lesion concerning for hepatocellular carcinoma.  IV analgesics and IV antiemetics for symptom control Consults called: IR Admission status: Inpatient   Consultants:   Oncology, interventional radiology.  Procedures:  Ct Abdomen Pelvis W Contrast  Result Date: 11/29/2018 CLINICAL DATA:  Upper abdominal pain for several weeks. Smoker. No history of primary malignancy per the medical record. EXAM: CT ABDOMEN AND PELVIS WITH CONTRAST TECHNIQUE: Multidetector CT imaging of the abdomen and pelvis was performed using the standard protocol following bolus administration of intravenous contrast. CONTRAST:  198mL OMNIPAQUE IOHEXOL 300 MG/ML  SOLN COMPARISON:  None. FINDINGS: Lower chest: Clear lung bases. Tiny left pleural effusion with pleural irregularity, most apparent on image 3/4. Normal heart size with right coronary artery atherosclerosis. Hepatobiliary: Hepatomegaly at 21.6 cm craniocaudal. Irregular hepatic capsule with caudate and lateral segment left liver lobe enlargement. Heterogeneous density throughout the liver, with more well-defined hypoattenuating lesions including in the high left hepatic lobe at 2.1 cm on image 15/3 and in the central right hepatic lobe at 1.8 cm on the same image. The gallbladder is contracted and appears thick walled. No pericholecystic edema. No biliary duct dilatation. Pancreas: Normal, without mass or ductal dilatation. Spleen: Normal in size, without focal abnormality. Adrenals/Urinary Tract: Left greater than right adrenal thickening. Too small  to characterize lesions in both kidneys. No hydronephrosis. Normal urinary bladder. Stomach/Bowel: Normal stomach, without wall thickening. Scattered colonic  diverticula. Normal terminal ileum and appendix. Normal small bowel. Vascular/Lymphatic: Aortic and branch vessel atherosclerosis. Patent portal and splenic veins. No specific evidence of portal venous hypertension. Extensive abdominal adenopathy. Index aortocaval node of 2.2 cm on image 36/3. Porta hepatis adenopathy at 2.4 cm on image 28/3. Retrocrural adenopathy at 1.1 cm on image 20/3. No pelvic sidewall adenopathy. Reproductive: Normal prostate. Other: Small volume abdominopelvic ascites. Perihepatic nodularity may represent small lymph nodes or omental metastasis including at 9 mm on image 43/3. Right inguinal hernia contains fat. Musculoskeletal: Anterior left seventh rib nonacute fracture. Lower thoracic spondylosis. IMPRESSION: 1. 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 Widespread metastatic disease from an otherwise unknown primary is possible but felt less likely. 2. Upper abdominal and lower thoracic adenopathy, most consistent with metastatic disease. 3. Small left pleural effusion with findings suspicious for pleural metastasis. 4. Probable perihepatic omental/peritoneal metastasis. 5. Coronary artery atherosclerosis. Aortic Atherosclerosis (ICD10-I70.0). 6. Small volume abdominopelvic ascites. Electronically Signed   By: Abigail Miyamoto M.D.   On: 11/29/2018 14:34     Antimicrobials:   None   Subjective: Patient reports still with some moderate abdominal pain responsive to IV opioids.  Reported some nausea with no vomiting  Objective: Vitals:   11/30/18 0629 11/30/18 1255 11/30/18 2212 12/01/18 0436  BP: (!) 142/61 (!) 160/90 (!) 156/88 (!) 158/87  Pulse: 67 65 63 64  Resp: 19 17 20 19   Temp: 98 F (36.7 C) 97.6 F (36.4 C) 98.2 F (36.8 C) 98.2 F (36.8 C)  TempSrc: Oral Oral Oral Oral  SpO2: 96% 98% 96% 98%  Weight:      Height:        Intake/Output Summary (Last 24 hours) at 12/01/2018 1205 Last data filed at  12/01/2018 0858 Gross per 24 hour  Intake 2273.05 ml  Output 725 ml  Net 1548.05 ml   Filed Weights   11/29/18 1003  Weight: 95.3 kg    Examination:  General exam: Appears calm and comfortable  Respiratory system: Clear to auscultation. Respiratory effort normal. Cardiovascular system: S1 & S2 heard, RRR. No JVD, murmurs, rubs, gallops or clicks. No pedal edema. Gastrointestinal system: Quiet bowel sounds, tender palpation right upper quadrant, not rigid no guarding. Central nervous system: Alert and oriented. No focal neurological deficits. Extremities: Symmetric 5 x 5 power. Skin: No rashes, lesions or ulcers Psychiatry: Judgement and insight appear normal. Mood & affect appropriate.     Data Reviewed: I have personally reviewed following labs and imaging studies  CBC: Recent Labs  Lab 11/29/18 0955 11/29/18 1833  WBC 6.5 6.3  NEUTROABS 4.6  --   HGB 15.0 14.6  HCT 45.1 44.1  MCV 94.2 94.4  PLT 187 756   Basic Metabolic Panel: Recent Labs  Lab 11/29/18 0955 11/29/18 1833 12/01/18 0118  NA 135  --  137  K 3.4*  --  4.0  CL 101  --  101  CO2 23  --  26  GLUCOSE 106*  --  79  BUN 24*  --  13  CREATININE 1.32* 1.25* 0.81  CALCIUM 9.4  --  8.5*   GFR: Estimated Creatinine Clearance: 109.5 mL/min (by C-G formula based on SCr of 0.81 mg/dL). Liver Function Tests: Recent Labs  Lab 11/29/18 0955 12/01/18 0118  AST 94* 91*  ALT 101* 96*  ALKPHOS 286* 260*  BILITOT  1.3* 1.5*  PROT 7.1 6.4*  ALBUMIN 3.7 3.1*   Recent Labs  Lab 11/29/18 0955  LIPASE 146*   No results for input(s): AMMONIA in the last 168 hours. Coagulation Profile: No results for input(s): INR, PROTIME in the last 168 hours. Cardiac Enzymes: No results for input(s): CKTOTAL, CKMB, CKMBINDEX, TROPONINI in the last 168 hours. BNP (last 3 results) No results for input(s): PROBNP in the last 8760 hours. HbA1C: No results for input(s): HGBA1C in the last 72 hours. CBG: No results for  input(s): GLUCAP in the last 168 hours. Lipid Profile: No results for input(s): CHOL, HDL, LDLCALC, TRIG, CHOLHDL, LDLDIRECT in the last 72 hours. Thyroid Function Tests: No results for input(s): TSH, T4TOTAL, FREET4, T3FREE, THYROIDAB in the last 72 hours. Anemia Panel: No results for input(s): VITAMINB12, FOLATE, FERRITIN, TIBC, IRON, RETICCTPCT in the last 72 hours. Sepsis Labs: No results for input(s): PROCALCITON, LATICACIDVEN in the last 168 hours.  No results found for this or any previous visit (from the past 240 hour(s)).       Radiology Studies: Ct Abdomen Pelvis W Contrast  Result Date: 11/29/2018 CLINICAL DATA:  Upper abdominal pain for several weeks. Smoker. No history of primary malignancy per the medical record. EXAM: CT ABDOMEN AND PELVIS WITH CONTRAST TECHNIQUE: Multidetector CT imaging of the abdomen and pelvis was performed using the standard protocol following bolus administration of intravenous contrast. CONTRAST:  168mL OMNIPAQUE IOHEXOL 300 MG/ML  SOLN COMPARISON:  None. FINDINGS: Lower chest: Clear lung bases. Tiny left pleural effusion with pleural irregularity, most apparent on image 3/4. Normal heart size with right coronary artery atherosclerosis. Hepatobiliary: Hepatomegaly at 21.6 cm craniocaudal. Irregular hepatic capsule with caudate and lateral segment left liver lobe enlargement. Heterogeneous density throughout the liver, with more well-defined hypoattenuating lesions including in the high left hepatic lobe at 2.1 cm on image 15/3 and in the central right hepatic lobe at 1.8 cm on the same image. The gallbladder is contracted and appears thick walled. No pericholecystic edema. No biliary duct dilatation. Pancreas: Normal, without mass or ductal dilatation. Spleen: Normal in size, without focal abnormality. Adrenals/Urinary Tract: Left greater than right adrenal thickening. Too small to characterize lesions in both kidneys. No hydronephrosis. Normal urinary  bladder. Stomach/Bowel: Normal stomach, without wall thickening. Scattered colonic diverticula. Normal terminal ileum and appendix. Normal small bowel. Vascular/Lymphatic: Aortic and branch vessel atherosclerosis. Patent portal and splenic veins. No specific evidence of portal venous hypertension. Extensive abdominal adenopathy. Index aortocaval node of 2.2 cm on image 36/3. Porta hepatis adenopathy at 2.4 cm on image 28/3. Retrocrural adenopathy at 1.1 cm on image 20/3. No pelvic sidewall adenopathy. Reproductive: Normal prostate. Other: Small volume abdominopelvic ascites. Perihepatic nodularity may represent small lymph nodes or omental metastasis including at 9 mm on image 43/3. Right inguinal hernia contains fat. Musculoskeletal: Anterior left seventh rib nonacute fracture. Lower thoracic spondylosis. IMPRESSION: 1. 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 Widespread metastatic disease from an otherwise unknown primary is possible but felt less likely. 2. Upper abdominal and lower thoracic adenopathy, most consistent with metastatic disease. 3. Small left pleural effusion with findings suspicious for pleural metastasis. 4. Probable perihepatic omental/peritoneal metastasis. 5. Coronary artery atherosclerosis. Aortic Atherosclerosis (ICD10-I70.0). 6. Small volume abdominopelvic ascites. Electronically Signed   By: Abigail Miyamoto M.D.   On: 11/29/2018 14:34        Scheduled Meds: . enoxaparin (LOVENOX) injection  40 mg Subcutaneous Q24H  . feeding supplement (ENSURE ENLIVE)  237 mL Oral BID BM  . nicotine  21 mg Transdermal Daily   Continuous Infusions: . sodium chloride 125 mL/hr at 11/29/18 1203  . lactated ringers 75 mL/hr at 12/01/18 0441     LOS: 2 days    Time spent: 35 min    Nicolette Bang, MD [facility]  If 7PM-7AM, please contact night-coverage  12/01/2018, 12:05 PM

## 2018-12-01 NOTE — Plan of Care (Signed)
  Problem: Elimination: Goal: Will not experience complications related to bowel motility Outcome: Progressing Goal: Will not experience complications related to urinary retention Outcome: Progressing   Problem: Safety: Goal: Ability to remain free from injury will improve Outcome: Progressing   

## 2018-12-01 NOTE — Consult Note (Addendum)
[place]  [phone] [phone]   MEDICAL ONCOLOGY - INITIAL CONSULTATION  Referral MD: Dr. 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  HPI: Cody Montoya is a 63 y.o. male with medical history significant of alcohol abuse.  He does not have a primary care provider and does not seek routine medical care. Around 3 weeks ago, the patient started having epigastric abdominal pain.  It would wax and wane in severity but progressively worsened since onset.  He has had associated nausea and vomiting.  His appetite has decreased and he is lost around 10 pounds.  He stopped drinking when this happened.  He would drink around 7-8 shots of vodka daily in addition to the 2-3 beers.  He has done this for the past 4-5 years.  He does not have any known history of hepatitis B or C and denies taking any medication/supplements routinely.  He denies any prominent vessels, yellowing of the skin/eyes, fevers, shortness of breath, or bowel changes.  The patient has been using ibuprofen for pain with little relief.  In the emergency room, a CT scan shows multifocal hepatocellular carcinoma that is not appear to be metastatic to the liver.  There is also thoracic adenopathy, omental/peritoneal involvement and left pleural effusion suggestive of metastatic disease.  The oncology team was called and recommended admission for work-up.    When seen today, the patient reports that he continues to have abdominal discomfort in his upper abdomen.  Along with the discomfort in his abdomen, he also reports nausea and vomiting and increased constipation.  This is been present for about 2 to 3 weeks.  He states his appetite has been decreased and he has lost weight.  He estimates that he has lost  about 10 pounds over the past few months.  Oncology was asked to see the patient to make recommendations regarding his liver masses.    Past Medical History:  Diagnosis Date  . Cancer [facility])   :  Past Surgical History:  Procedure Laterality Date  . NO PAST SURGERIES    :  Current Facility-Administered Medications  Medication Dose Route Frequency Provider Last Rate Last Dose  . 0.9 %  sodium chloride infusion   Intravenous Continuous Dorie Rank, MD 125 mL/hr at 11/29/18 1203    . enoxaparin (LOVENOX) injection 40 mg  40 mg Subcutaneous Q24H Shelda Pal, DO   40 mg at 11/30/18 1659  . feeding supplement (ENSURE ENLIVE) (ENSURE ENLIVE) liquid 237 mL  237 mL Oral BID BM Spongberg, Audie Pinto, MD      . lactated ringers infusion   Intravenous Continuous Shelda Pal, DO 75 mL/hr at 12/01/18 0441    . morphine 2 MG/ML injection 1-3 mg  1-3 mg Intravenous Q2H PRN Shelda Pal, DO   3 mg at 12/01/18 1039  . nicotine (NICODERM CQ - dosed in mg/24 hours) patch 21 mg  21 mg Transdermal Daily Marcell Anger, MD   21 mg at 11/30/18 2129  . ondansetron (ZOFRAN) tablet 4 mg  4 mg Oral Q6H PRN Shelda Pal, DO       Or  . ondansetron [facility]) injection 4 mg  4 mg Intravenous Q6H PRN Shelda Pal, DO   4 mg at 12/01/18 1047  . oxyCODONE (Oxy IR/ROXICODONE) immediate release tablet  5 mg  5 mg Oral Q4H PRN Shelda Pal, DO   5 mg at 11/30/18 2128  . polyethylene glycol (MIRALAX / GLYCOLAX) packet 17 g  17 g Oral Daily PRN Shelda Pal, DO      . traZODone (DESYREL) tablet 50 mg  50 mg Oral QHS PRN Shelda Pal, DO         No Known Allergies:  History reviewed. No pertinent family history.:  Social History   Socioeconomic History  . Marital status: Divorced    Spouse name: Not on file  . Number of children: Not on file  . Years of education: Not on file  . Highest education level: Not on file   Occupational History  . Occupation: Surveyor, quantity  Social Needs  . Financial resource strain: Not very hard  . Food insecurity:    Worry: Never true    Inability: Never true  . Transportation needs:    Medical: No    Non-medical: No  Tobacco Use  . Smoking status: Current Every Day Smoker    Packs/day: 1.00    Years: 45.00    Pack years: 45.00    Types: Cigarettes  Substance and Sexual Activity  . Alcohol use: Yes    Alcohol/week: 8.0 standard drinks    Types: 3 Cans of beer, 5 Shots of liquor per week    Comment: no alcohol in past month due to symptoms  . Drug use: Not Currently    Frequency: 7.0 times per week    Types: Marijuana  . Sexual activity: Not Currently  Lifestyle  . Physical activity:    Days per week: 5 days    Minutes per session: 150+ min  . Stress: To some extent  Relationships  . Social connections:    Talks on phone: More than three times a week    Gets together: More than three times a week    Attends religious service: Never    Active member of club or organization: No    Attends meetings of clubs or organizations: Never    Relationship status: Living with partner  . Intimate partner violence:    Fear of current or ex partner: No    Emotionally abused: No    Physically abused: No    Forced sexual activity: No  Other Topics Concern  . Not on file  Social History Narrative  . Not on file   Review of Systems: Constitutional: Reports decreased appetite and weight loss of about 10 pounds over the past few months.  Denies fevers and chills. HENT:   Negative for mouth sores, nosebleeds, sore throat and trouble swallowing.   Eyes: Negative for eye problems and icterus.  Respiratory: Negative for cough, hemoptysis, shortness of breath and wheezing.   Cardiovascular: Negative for chest pain and leg swelling.  Gastrointestinal: Reports abdominal discomfort, nausea, vomiting, constipation.  Denies diarrhea. Genitourinary: Negative for bladder  incontinence, difficulty urinating, dysuria, frequency and hematuria.   Musculoskeletal: Negative for back pain, gait problem, neck pain and neck stiffness.  Skin: Negative for itching and rash.  Neurological: Negative for dizziness, extremity weakness, gait problem, headaches, light-headedness and seizures.  Hematological: Negative for adenopathy. Does not bruise/bleed easily.  Psychiatric/Behavioral: Negative for confusion, depression and sleep disturbance. The patient is not nervous/anxious.    Exam: Patient Vitals for the past 24 hrs:  BP Temp Temp src Pulse Resp SpO2  12/01/18 0436 (!) 158/87 98.2 F (36.8 C) Oral 64 19 98 %  11/30/18  2212 (!) 156/88 98.2 F (36.8 C) Oral 63 20 96 %  11/30/18 1255 (!) 160/90 97.6 F (36.4 C) Oral 65 17 98 %    General:  well-nourished in no acute distress.  Eyes:  no scleral icterus.  ENT:  There were no oropharyngeal lesions.  Neck was without thyromegaly.  Lymphatics:  Negative cervical, supraclavicular or axillary adenopathy.  Respiratory: lungs were clear bilaterally without wheezing or crackles.  Cardiovascular:  Regular rate and rhythm, S1/S2, without murmur, rub or gallop.  There was no pedal edema.  GI: Positive bowel sounds, mild distention.  Liver edge palpable approximately 2 cm below the right costal margin.  Muscoloskeletal:  no spinal tenderness of palpation of vertebral spine.  Skin exam was without echymosis, petichae.  Patient was alert and oriented.  Attention was good.   Language was appropriate.  Mood was normal without depression.  Speech was not pressured.  Thought content was not tangential.     Lab Results  Component Value Date   WBC 6.3 11/29/2018   HGB 14.6 11/29/2018   HCT 44.1 11/29/2018   PLT 191 11/29/2018   GLUCOSE 79 12/01/2018   ALT 96 (H) 12/01/2018   AST 91 (H) 12/01/2018   NA 137 12/01/2018   K 4.0 12/01/2018   CL 101 12/01/2018   CREATININE 0.81 12/01/2018   BUN 13 12/01/2018   CO2 26 12/01/2018    Ct  Abdomen Pelvis W Contrast  Result Date: 11/29/2018 CLINICAL DATA:  Upper abdominal pain for several weeks. Smoker. No history of primary malignancy per the medical record. EXAM: CT ABDOMEN AND PELVIS WITH CONTRAST TECHNIQUE: Multidetector CT imaging of the abdomen and pelvis was performed using the standard protocol following bolus administration of intravenous contrast. CONTRAST:  143mL OMNIPAQUE IOHEXOL 300 MG/ML  SOLN COMPARISON:  None. FINDINGS: Lower chest: Clear lung bases. Tiny left pleural effusion with pleural irregularity, most apparent on image 3/4. Normal heart size with right coronary artery atherosclerosis. Hepatobiliary: Hepatomegaly at 21.6 cm craniocaudal. Irregular hepatic capsule with caudate and lateral segment left liver lobe enlargement. Heterogeneous density throughout the liver, with more well-defined hypoattenuating lesions including in the high left hepatic lobe at 2.1 cm on image 15/3 and in the central right hepatic lobe at 1.8 cm on the same image. The gallbladder is contracted and appears thick walled. No pericholecystic edema. No biliary duct dilatation. Pancreas: Normal, without mass or ductal dilatation. Spleen: Normal in size, without focal abnormality. Adrenals/Urinary Tract: Left greater than right adrenal thickening. Too small to characterize lesions in both kidneys. No hydronephrosis. Normal urinary bladder. Stomach/Bowel: Normal stomach, without wall thickening. Scattered colonic diverticula. Normal terminal ileum and appendix. Normal small bowel. Vascular/Lymphatic: Aortic and branch vessel atherosclerosis. Patent portal and splenic veins. No specific evidence of portal venous hypertension. Extensive abdominal adenopathy. Index aortocaval node of 2.2 cm on image 36/3. Porta hepatis adenopathy at 2.4 cm on image 28/3. Retrocrural adenopathy at 1.1 cm on image 20/3. No pelvic sidewall adenopathy. Reproductive: Normal prostate. Other: Small volume abdominopelvic ascites.  Perihepatic nodularity may represent small lymph nodes or omental metastasis including at 9 mm on image 43/3. Right inguinal hernia contains fat. Musculoskeletal: Anterior left seventh rib nonacute fracture. Lower thoracic spondylosis. IMPRESSION: 1. 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 Widespread metastatic disease from an otherwise unknown primary is possible but felt less likely. 2. Upper abdominal and lower thoracic adenopathy, most consistent with metastatic disease. 3. Small left pleural effusion with findings suspicious for  pleural metastasis. 4. Probable perihepatic omental/peritoneal metastasis. 5. Coronary artery atherosclerosis. Aortic Atherosclerosis (ICD10-I70.0). 6. Small volume abdominopelvic ascites. Electronically Signed   By: Abigail Miyamoto M.D.   On: 11/29/2018 14:34    Ct Abdomen Pelvis W Contrast  Result Date: 11/29/2018 CLINICAL DATA:  Upper abdominal pain for several weeks. Smoker. No history of primary malignancy per the medical record. EXAM: CT ABDOMEN AND PELVIS WITH CONTRAST TECHNIQUE: Multidetector CT imaging of the abdomen and pelvis was performed using the standard protocol following bolus administration of intravenous contrast. CONTRAST:  145mL OMNIPAQUE IOHEXOL 300 MG/ML  SOLN COMPARISON:  None. FINDINGS: Lower chest: Clear lung bases. Tiny left pleural effusion with pleural irregularity, most apparent on image 3/4. Normal heart size with right coronary artery atherosclerosis. Hepatobiliary: Hepatomegaly at 21.6 cm craniocaudal. Irregular hepatic capsule with caudate and lateral segment left liver lobe enlargement. Heterogeneous density throughout the liver, with more well-defined hypoattenuating lesions including in the high left hepatic lobe at 2.1 cm on image 15/3 and in the central right hepatic lobe at 1.8 cm on the same image. The gallbladder is contracted and appears thick walled. No pericholecystic edema. No  biliary duct dilatation. Pancreas: Normal, without mass or ductal dilatation. Spleen: Normal in size, without focal abnormality. Adrenals/Urinary Tract: Left greater than right adrenal thickening. Too small to characterize lesions in both kidneys. No hydronephrosis. Normal urinary bladder. Stomach/Bowel: Normal stomach, without wall thickening. Scattered colonic diverticula. Normal terminal ileum and appendix. Normal small bowel. Vascular/Lymphatic: Aortic and branch vessel atherosclerosis. Patent portal and splenic veins. No specific evidence of portal venous hypertension. Extensive abdominal adenopathy. Index aortocaval node of 2.2 cm on image 36/3. Porta hepatis adenopathy at 2.4 cm on image 28/3. Retrocrural adenopathy at 1.1 cm on image 20/3. No pelvic sidewall adenopathy. Reproductive: Normal prostate. Other: Small volume abdominopelvic ascites. Perihepatic nodularity may represent small lymph nodes or omental metastasis including at 9 mm on image 43/3. Right inguinal hernia contains fat. Musculoskeletal: Anterior left seventh rib nonacute fracture. Lower thoracic spondylosis. IMPRESSION: 1. 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 Widespread metastatic disease from an otherwise unknown primary is possible but felt less likely. 2. Upper abdominal and lower thoracic adenopathy, most consistent with metastatic disease. 3. Small left pleural effusion with findings suspicious for pleural metastasis. 4. Probable perihepatic omental/peritoneal metastasis. 5. Coronary artery atherosclerosis. Aortic Atherosclerosis (ICD10-I70.0). 6. Small volume abdominopelvic ascites. Electronically Signed   By: Abigail Miyamoto M.D.   On: 11/29/2018 14:34    Assessment and Plan:   This is a 63 year old male with:  1) liver masses which could represent multifocal hepatocellular carcinoma versus widespread metastatic disease from another primary site.  AFP is normal.   Recommend CT of the chest to complete staging work-up.  This is being done today.  An ultrasound-guided liver biopsy will be performed by interventional radiology and may be done later today or sometime tomorrow.  Await pathology results before making further recommendations.  2) psychosocial issues: Consult social work to [employer] issues and barriers to care.  He may also benefit from palliative care consult to define goals of care.  ADDENDUM: CT of the chest results returned after note was dictated.  The patient was found to have a large left upper lobe lung mass with invasion of the mediastinum and left hilum.  There is also associated left-sided pleural metastasis with pleural nodularity and effusion noted.  Ipsilateral hilar and bilateral mediastinal nodal metastasis was noted.  There is also left supraclavicular  metastasis noted.  There is a lytic lesion involving the posterior aspect of the approximate T11 vertebra with suspected epidural tumor involvement noted.  An MRI of the thoracic spine was recommended.  This has been ordered by the hospitalist and is pending.  Pathology is still pending.  The patient may need a radiation oncology referral pending the results of the MRI.  Thank you for this referral.   Mikey Bussing, DNP, AGPCNP-BC, AOCNP   ADDENDUM   .Patient was Personally and independently interviewed, examined and relevant elements of the history of present illness were reviewed in details and an assessment and plan was created. All elements of the patient's history of present illness , assessment and plan were discussed in details with Mikey Bussing. The above documentation reflects our combined findings assessment and plan.   Imaging suggestive of metastatic lung cancer with bone metastases, liver mets and LN mets. Plan -biopsy of liver lesion done -- results pending -outpatient f/u with oncology clinic in 1 week -will need outpatient MRI Brain for completion of  staging workup. -further treatment recommendation based on pathology results.  Sullivan Lone MD MS

## 2018-12-01 NOTE — Procedures (Addendum)
Interventional Radiology Procedure Note  Procedure: US guided liver mass biopsy Complications: None Recommendations:  - routine wound care - Do not submerge for 7 days - advance diet per primary team   Signed,  Dulcy Fanny. Earleen Newport, DO

## 2018-12-01 NOTE — Progress Notes (Signed)
Chief Complaint: Patient was seen in consultation today for liver lesion biopsy at the request of Dr. Wyonia Hough  Referring Physician(s): Dr. Wyonia Hough  Supervising Physician: Corrie Mckusick  Patient Status: [facility] - In-pt  History of Present Illness: Cody Montoya is a 63 y.o. male admitted with abdominal pain. His workup has unfortunately found liver lesions concerning for malignant process. IR is asked to perform biopsy. PMHx, meds, labs, imaging, allergies reviewed. Feels well, no recent fevers, chills, illness. Has been NPO today since about 0730.    Past Medical History:  Diagnosis Date  . Cancer [facility])     Past Surgical History:  Procedure Laterality Date  . NO PAST SURGERIES      Allergies: Patient has no known allergies.  Medications:  Current Facility-Administered Medications:  .  [COMPLETED] sodium chloride 0.9 % bolus 1,000 mL, 1,000 mL, Intravenous, Once, Stopped at 11/29/18 1157 **AND** 0.9 %  sodium chloride infusion, , Intravenous, Continuous, Dorie Rank, MD, Last Rate: 125 mL/hr at 11/29/18 1203 .  enoxaparin (LOVENOX) injection 40 mg, 40 mg, Subcutaneous, Q24H, Shelda Pal, DO, 40 mg at 11/30/18 1659 .  feeding supplement (ENSURE ENLIVE) (ENSURE ENLIVE) liquid 237 mL, 237 mL, Oral, BID BM, Spongberg, Audie Pinto, MD .  lactated ringers infusion, , Intravenous, Continuous, Shelda Pal, DO, Last Rate: 75 mL/hr at 12/01/18 0441 .  morphine 2 MG/ML injection 1-3 mg, 1-3 mg, Intravenous, Q2H PRN, Shelda Pal, DO, 3 mg at 12/01/18 1039 .  nicotine (NICODERM CQ - dosed in mg/24 hours) patch 21 mg, 21 mg, Transdermal, Daily, Spongberg, Audie Pinto, MD, 21 mg at 11/30/18 2129 .  ondansetron (ZOFRAN) tablet 4 mg, 4 mg, Oral, Q6H PRN **OR** ondansetron (ZOFRAN) injection 4 mg, 4 mg, Intravenous, Q6H PRN, Shelda Pal, DO, 4 mg at 12/01/18 1047 .  oxyCODONE (Oxy IR/ROXICODONE) immediate release tablet 5 mg, 5  mg, Oral, Q4H PRN, Shelda Pal, DO, 5 mg at 11/30/18 2128 .  polyethylene glycol (MIRALAX / GLYCOLAX) packet 17 g, 17 g, Oral, Daily PRN, Shelda Pal, DO .  traZODone (DESYREL) tablet 50 mg, 50 mg, Oral, QHS PRN, Shelda Pal, DO    History reviewed. No pertinent family history.  Social History   Socioeconomic History  . Marital status: Divorced    Spouse name: Not on file  . Number of children: Not on file  . Years of education: Not on file  . Highest education level: Not on file  Occupational History  . Occupation: Surveyor, quantity  Social Needs  . Financial resource strain: Not very hard  . Food insecurity:    Worry: Never true    Inability: Never true  . Transportation needs:    Medical: No    Non-medical: No  Tobacco Use  . Smoking status: Current Every Day Smoker    Packs/day: 1.00    Years: 45.00    Pack years: 45.00    Types: Cigarettes  Substance and Sexual Activity  . Alcohol use: Yes    Alcohol/week: 8.0 standard drinks    Types: 3 Cans of beer, 5 Shots of liquor per week    Comment: no alcohol in past month due to symptoms  . Drug use: Not Currently    Frequency: 7.0 times per week    Types: Marijuana  . Sexual activity: Not Currently  Lifestyle  . Physical activity:    Days per week: 5 days    Minutes per session: 150+ min  . Stress:  To some extent  Relationships  . Social connections:    Talks on phone: More than three times a week    Gets together: More than three times a week    Attends religious service: Never    Active member of club or organization: No    Attends meetings of clubs or organizations: Never    Relationship status: Living with partner  Other Topics Concern  . Not on file  Social History Narrative  . Not on file     Review of Systems: A 12 point ROS discussed and pertinent positives are indicated in the HPI above.  All other systems are negative.  Review of Systems  Vital Signs: BP  (!) 158/87 (BP Location: Left Arm)   Pulse 64   Temp 98.2 F (36.8 C) (Oral)   Resp 19   Ht 5\' 10"  (1.778 m)   Wt 95.3 kg   SpO2 98%   BMI 30.13 kg/m   Physical Exam Constitutional:      Appearance: He is well-developed.  HENT:     Head: Normocephalic and atraumatic.     Mouth/Throat:     Mouth: Mucous membranes are moist.     Pharynx: Oropharynx is clear.  Cardiovascular:     Rate and Rhythm: Normal rate and regular rhythm.     Heart sounds: Normal heart sounds.  Pulmonary:     Effort: Pulmonary effort is normal. No respiratory distress.     Breath sounds: Normal breath sounds.  Abdominal:     General: Abdomen is flat. There is no distension.     Palpations: Abdomen is soft.  Skin:    General: Skin is warm and dry.  Neurological:     General: No focal deficit present.     Mental Status: He is alert and oriented to person, place, and time.     Imaging: Ct Abdomen Pelvis W Contrast  Result Date: 11/29/2018 CLINICAL DATA:  Upper abdominal pain for several weeks. Smoker. No history of primary malignancy per the medical record. EXAM: CT ABDOMEN AND PELVIS WITH CONTRAST TECHNIQUE: Multidetector CT imaging of the abdomen and pelvis was performed using the standard protocol following bolus administration of intravenous contrast. CONTRAST:  168mL OMNIPAQUE IOHEXOL 300 MG/ML  SOLN COMPARISON:  None. FINDINGS: Lower chest: Clear lung bases. Tiny left pleural effusion with pleural irregularity, most apparent on image 3/4. Normal heart size with right coronary artery atherosclerosis. Hepatobiliary: Hepatomegaly at 21.6 cm craniocaudal. Irregular hepatic capsule with caudate and lateral segment left liver lobe enlargement. Heterogeneous density throughout the liver, with more well-defined hypoattenuating lesions including in the high left hepatic lobe at 2.1 cm on image 15/3 and in the central right hepatic lobe at 1.8 cm on the same image. The gallbladder is contracted and appears thick  walled. No pericholecystic edema. No biliary duct dilatation. Pancreas: Normal, without mass or ductal dilatation. Spleen: Normal in size, without focal abnormality. Adrenals/Urinary Tract: Left greater than right adrenal thickening. Too small to characterize lesions in both kidneys. No hydronephrosis. Normal urinary bladder. Stomach/Bowel: Normal stomach, without wall thickening. Scattered colonic diverticula. Normal terminal ileum and appendix. Normal small bowel. Vascular/Lymphatic: Aortic and branch vessel atherosclerosis. Patent portal and splenic veins. No specific evidence of portal venous hypertension. Extensive abdominal adenopathy. Index aortocaval node of 2.2 cm on image 36/3. Porta hepatis adenopathy at 2.4 cm on image 28/3. Retrocrural adenopathy at 1.1 cm on image 20/3. No pelvic sidewall adenopathy. Reproductive: Normal prostate. Other: Small volume abdominopelvic ascites. Perihepatic nodularity may  represent small lymph nodes or omental metastasis including at 9 mm on image 43/3. Right inguinal hernia contains fat. Musculoskeletal: Anterior left seventh rib nonacute fracture. Lower thoracic spondylosis. IMPRESSION: 1. 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 Widespread metastatic disease from an otherwise unknown primary is possible but felt less likely. 2. Upper abdominal and lower thoracic adenopathy, most consistent with metastatic disease. 3. Small left pleural effusion with findings suspicious for pleural metastasis. 4. Probable perihepatic omental/peritoneal metastasis. 5. Coronary artery atherosclerosis. Aortic Atherosclerosis (ICD10-I70.0). 6. Small volume abdominopelvic ascites. Electronically Signed   By: Abigail Miyamoto M.D.   On: 11/29/2018 14:34    Labs:  CBC: Recent Labs    11/29/18 0955 11/29/18 1833  WBC 6.5 6.3  HGB 15.0 14.6  HCT 45.1 44.1  PLT 187 191    COAGS: No results for input(s): INR, APTT in the last  8760 hours.  BMP: Recent Labs    11/29/18 0955 11/29/18 1833 12/01/18 0118  NA 135  --  137  K 3.4*  --  4.0  CL 101  --  101  CO2 23  --  26  GLUCOSE 106*  --  79  BUN 24*  --  13  CALCIUM 9.4  --  8.5*  CREATININE 1.32* 1.25* 0.81  GFRNONAA 57* >60 >60  GFRAA >60 >60 >60    LIVER FUNCTION TESTS: Recent Labs    11/29/18 0955 12/01/18 0118  BILITOT 1.3* 1.5*  AST 94* 91*  ALT 101* 96*  ALKPHOS 286* 260*  PROT 7.1 6.4*  ALBUMIN 3.7 3.1*    TUMOR MARKERS: No results for input(s): AFPTM, CEA, CA199, CHROMGRNA in the last 8760 hours.  Assessment and Plan: Liver lesions Imaging reviewed with Dr. Earleen Newport, amenable to US guided biopsy Labs reviewed. Risks and benefits of liver lesion biopsy was discussed with the patient and/or patient's family including, but not limited to bleeding, infection, damage to adjacent structures or low yield requiring additional tests.  All of the questions were answered and there is agreement to proceed.  Consent signed and in chart.  **Heavy case schedule today in IR, advised pt possibility that procedure may not be done today, may have to be delayed until tomorrow**    Thank you for this interesting consult.  I greatly enjoyed meeting Aly Hauser Holsworth and look forward to participating in their care.  A copy of this report was sent to the requesting provider on this date.  Electronically Signed: Ascencion Dike, PA-C 12/01/2018, 11:52 AM   I spent a total of 20 minutes in face to face in clinical consultation, greater than 50% of which was counseling/coordinating care for liver biopsy

## 2018-12-01 NOTE — Progress Notes (Signed)
Received call regarding chest CT results.cDr. Wyonia Hough notified results are completed.

## 2018-12-02 DIAGNOSIS — C3490 Malignant neoplasm of unspecified part of unspecified bronchus or lung: Secondary | ICD-10-CM

## 2018-12-02 DIAGNOSIS — C7951 Secondary malignant neoplasm of bone: Secondary | ICD-10-CM

## 2018-12-02 DIAGNOSIS — C799 Secondary malignant neoplasm of unspecified site: Secondary | ICD-10-CM

## 2018-12-02 DIAGNOSIS — C787 Secondary malignant neoplasm of liver and intrahepatic bile duct: Secondary | ICD-10-CM

## 2018-12-02 DIAGNOSIS — F17200 Nicotine dependence, unspecified, uncomplicated: Secondary | ICD-10-CM

## 2018-12-02 LAB — COMPREHENSIVE METABOLIC PANEL
ALT: 103 U/L — ABNORMAL HIGH (ref 0–44)
AST: 101 U/L — ABNORMAL HIGH (ref 15–41)
Albumin: 2.8 g/dL — ABNORMAL LOW (ref 3.5–5.0)
Alkaline Phosphatase: 272 U/L — ABNORMAL HIGH (ref 38–126)
Anion gap: 9 (ref 5–15)
BUN: 9 mg/dL (ref 8–23)
CO2: 25 mmol/L (ref 22–32)
Calcium: 8.6 mg/dL — ABNORMAL LOW (ref 8.9–10.3)
Chloride: 102 mmol/L (ref 98–111)
Creatinine, Ser: 0.89 mg/dL (ref 0.61–1.24)
GFR calc Af Amer: >60 mL/min (ref >60)
Glucose, Bld: 78 mg/dL (ref 70–99)
Potassium: 3.9 mmol/L (ref 3.5–5.1)
Sodium: 136 mmol/L (ref 135–145)
Total Bilirubin: 2.3 mg/dL — ABNORMAL HIGH (ref 0.3–1.2)
Total Protein: 6 g/dL — ABNORMAL LOW (ref 6.5–8.1)

## 2018-12-02 MED ORDER — DOCUSATE SODIUM 100 MG PO CAPS
100.0000 mg | ORAL_CAPSULE | Freq: Two times a day (BID) | ORAL | Status: DC
  Filled 2018-12-02: qty 1

## 2018-12-02 MED ORDER — OXYCODONE HCL 5 MG PO TABS: 5.0000 mg | ORAL_TABLET | ORAL | 0 refills | Status: DC | PRN

## 2018-12-02 MED ORDER — POLYETHYLENE GLYCOL 3350 17 G PO PACK: 17.0000 g | PACK | Freq: Every day | ORAL | 0 refills | Status: AC | PRN

## 2018-12-02 MED ORDER — ENSURE ENLIVE PO LIQD
237.0000 mL | Freq: Three times a day (TID) | ORAL | Status: DC
  Administered 2018-12-02: 237 mL via ORAL

## 2018-12-02 MED ORDER — ADULT MULTIVITAMIN W/MINERALS CH
1.0000 | ORAL_TABLET | Freq: Every day | ORAL | Status: DC
  Administered 2018-12-02: 1 via ORAL
  Filled 2018-12-02: qty 1

## 2018-12-02 MED ORDER — OXYCODONE HCL ER 10 MG PO T12A
10.0000 mg | EXTENDED_RELEASE_TABLET | Freq: Two times a day (BID) | ORAL | Status: DC
  Administered 2018-12-02: 10 mg via ORAL
  Filled 2018-12-02: qty 1

## 2018-12-02 MED ORDER — OXYCODONE HCL ER 10 MG PO T12A: 10.0000 mg | EXTENDED_RELEASE_TABLET | Freq: Two times a day (BID) | ORAL | 0 refills | Status: DC

## 2018-12-02 MED ORDER — DOCUSATE SODIUM 100 MG PO CAPS: 100.0000 mg | ORAL_CAPSULE | Freq: Two times a day (BID) | ORAL | 0 refills | Status: AC

## 2018-12-02 NOTE — Progress Notes (Signed)
Nutrition Follow-up  DOCUMENTATION CODES:   Obesity unspecified  INTERVENTION:   -Ensure Enlive po TID, each supplement provides 350 kcal and 20 grams of protein -MVI with minerals daily  NUTRITION DIAGNOSIS:   Increased nutrient needs related to cancer and cancer related treatments as evidenced by estimated needs.  Ongoing  GOAL:   Patient will meet greater than or equal to 90% of their needs  Progressing  MONITOR:   PO intake, Supplement acceptance, Diet advancement, Labs, Weight trends, Skin, I & O's  REASON FOR ASSESSMENT:   Malnutrition Screening Tool    ASSESSMENT:   Cody Montoya is a 63 y.o. male with medical history significant of alcohol abuse.  He does not have a primary care provider and does not seek routine medical care. Around 3 weeks ago, the patient started having epigastric abdominal pain.  It would wax and wane in severity but progressively worsened since onset.  He has had associated nausea and vomiting.  His appetite has decreased and he is lost around 10 pounds.  He stopped drinking when this happened.  He would drink around 7-8 shots of vodka daily in addition to the 2-3 beers.  He has done this for the past 4-5 years.  He does not have any known history of hepatitis B or C and denies taking any medication/supplements routinely.  He denies any prominent vessels, yellowing of the skin/eyes, fevers, shortness of breath, or bowel changes.  The patient has been using ibuprofen for pain with little relief.  3/9- s/p liver lesion biopsy  Reviewed I/O's: +2.1 L x 24 hours and +5.5 L since admission  Pt diet advanced to regular from yesterday. No documented meal intake to assess at this time. Pt amenable to supplements (Ensure Enlive) to optimize nutritional status.   Labs reviewed.   Diet Order:   Diet Order            Diet regular Room service appropriate? Yes; Fluid consistency: Thin  Diet effective now              EDUCATION NEEDS:    Education needs have been addressed  Skin:  Skin Assessment: Skin Integrity Issues: Skin Integrity Issues:: Incisions Incisions: closed abdomen incision  Last BM:  11/30/18  Height:   Ht Readings from Last 1 Encounters:  11/29/18 5\' 10"  (1.778 m)    Weight:   Wt Readings from Last 1 Encounters:  11/29/18 95.3 kg    Ideal Body Weight:  75.5 kg  BMI:  Body mass index is 30.13 kg/m.  Estimated Nutritional Needs:   Kcal:  2050-2250  Protein:  105-120 grams  Fluid:  2.0-2.2 L    Prudie Guthridge A. Jimmye Norman, RD, LDN, [place] Registered Dietitian II Certified Diabetes Care and Education Specialist Pager: [phone] After hours Pager: [phone]

## 2018-12-02 NOTE — Discharge Summary (Signed)
Physician Discharge Summary  Marcquis Ridlon Arseneault LPF:[id number] DOB: 1956-07-23 DOA: 11/29/2018  PCP: Patient, No Pcp Per  Admit date: 11/29/2018 Discharge date: 12/02/2018  Admitted From: Inpatient Disposition: home  Recommendations for Outpatient Follow-up:  1. Follow up with Dr Irene Limbo Oncology in one week 2. Please follow up on the following pending results:none  Home Health:No Equipment/Devices:none  Discharge Condition:Serious CODE STATUS:Full code Diet recommendation: Regular healthy diet  Brief/Interim Summary: This is an unfortunate 63 year old white male with no past medical history other than alcohol abuse and tobacco dependence who has not seen a primary care provider and has not followed up with routine medical care.  He presented to the ER with abdominal pain lasting approximately a month with a 10 pound weight loss.  He reported abdominal pain was profound and persistent the epigastric region.  He reported nausea, severe pain, decreased appetite, weight loss.  In the ER patient CT scan was concerning for multifocal hepatocellular carcinoma with thoracic adenopathy omental and peritoneal involvement as well as left pleural effusion suggestive of metastatic disease the case was consulted both the hospital service and the oncology team and patient was admitted for further eval and treatment.  In the hospital patient had additional CT of chest which was concerning for lung involvement as well as widespread lymphadenopathy.  An MRI of back which was limited secondary to patient's inability to finish the exam.  This was notable for C6-C7 disease as well as T2 and T5-T12 disease with epidural involvement.  IR was consulted for liver biopsy which was performed without complication.  Case was discussed with oncology who reviewed radiographic eval and met with the patient.  Further plans per outpatient hematology oncology consultation pending pathology results.  Patient was discharged with  pain medications as well as a bowel regimen.  Patient did not want to wait to have a brain MRI nor did he want to wait to speak to palliative care will follow-up with oncology as an outpatient.  Discharge Diagnoses:  Principal Problem:   Hepatocellular carcinoma ([place]) Active Problems:   History of alcohol abuse   Hypokalemia    Discharge Instructions  Discharge Instructions    Call MD for:   Complete by:  As directed    Any acute change in medical condition   Diet - low sodium heart healthy   Complete by:  As directed    Increase activity slowly   Complete by:  As directed      Allergies as of 12/02/2018   No Known Allergies     Medication List    TAKE these medications   docusate sodium 100 MG capsule Commonly known as:  COLACE Take 1 capsule (100 mg total) by mouth 2 (two) times daily.   ibuprofen 200 MG tablet Commonly known as:  ADVIL,MOTRIN Take 800 mg by mouth every 6 (six) hours as needed for fever or moderate pain.   oxyCODONE 5 MG immediate release tablet Commonly known as:  Oxy IR/ROXICODONE Take 1 tablet (5 mg total) by mouth every 4 (four) hours as needed for up to 7 days for moderate pain.   oxyCODONE 10 mg 12 hr tablet Commonly known as:  OXYCONTIN Take 1 tablet (10 mg total) by mouth every 12 (twelve) hours for 30 days. For cancer pain   polyethylene glycol packet Commonly known as:  MIRALAX / GLYCOLAX Take 17 g by mouth daily as needed for mild constipation.       No Known Allergies  Consultations:  Hematology oncology, interventional  radiology.   Procedures/Studies: Ct Chest W Contrast  Result Date: 12/01/2018 CLINICAL DATA:  Staging for new liver neoplasm found on CT EXAM: CT CHEST WITH CONTRAST TECHNIQUE: Multidetector CT imaging of the chest was performed during intravenous contrast administration. CONTRAST:  67m OMNIPAQUE IOHEXOL 300 MG/ML  SOLN COMPARISON:  None. FINDINGS: Cardiovascular: The heart size appears within normal limits.  No pericardial effusion identified. Mild aortic atherosclerosis. Three vessel coronary artery atherosclerotic calcifications identified. Mediastinum/Nodes: Left supraclavicular lymph node is prominent measuring 1 cm, image 5/3. Left sided pre-vascular lymph node within the superior mediastinum measures 1.6 cm, image 33/3. Right paratracheal lymph node measures 1.8 cm, image 57/3. No subcarinal or right hilar adenopathy. Low left paratracheal lymph node measures 2.2 cm, image 61/3. Lungs/Pleura: Small left pleural effusion. Within the medial left upper lobe there is a paramediastinal mass measuring 6.6 x 6.2 by 10.0 cm. Tumor invades the mediastinum and left hilum. Encasement and complete occlusion of the left upper lobe bronchus identified. There is also encasement of the left pulmonary artery. Complete occlusion of left upper pulmonary vein identified. Subpleural nodule within the lateral left upper lobe measures 5 mm, multiple small pleural nodules are identified overlying the left lung compatible with pleural metastasis, for example image 101/7. 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 See report from 11/29/2018 for details. Again seen is ascites, bilateral adrenal gland enlargement and abdominal adenopathy. Musculoskeletal: Chronic bilateral posttraumatic rib deformities are noted. There is a suspicious lytic lesion involving the approximate T11 vertebra with extension into the left posterior aspect of the canal, image 90/6. Here there is a epidural soft tissue component measuring 1.1 cm, image 90/6., image 118/3. IMPRESSION: 1. Large left upper lobe lung mass is identified with invasion of the mediastinum and left hilum. Associated left-sided pleural metastasis with pleural nodularity and effusion noted. Ipsilateral hilar and bilateral mediastinal nodal metastasis. Left supraclavicular  metastasis also noted. 2. Lytic lesion involving the posterior aspect of the approximate T11 vertebra with suspected epidural tumor involvement noted. Advise further evaluation with contrast enhanced MRI of the thoracic spine. 3. Upper abdominal metastasis as described on 11/29/2018. 4. These results will be called to the ordering clinician or representative by the Radiologist Assistant, and communication documented in the PACS or zVision Dashboard. Electronically Signed   By: TKerby MoorsM.D.   On: 12/01/2018 14:01   Mr Thoracic Spine Wo Contrast  Result Date: 12/01/2018 CLINICAL DATA:  Metastatic disease of thoracic spine. EXAM: MRI THORACIC SPINE WITHOUT CONTRAST TECHNIQUE: Multiplanar, multisequence MR imaging of the thoracic spine was performed. No intravenous contrast was administered. COMPARISON:  Chest CT 12/01/2018 FINDINGS: The examination had to be discontinued prior to completion due to patient pain following liver biopsy. Only sagittal sequences were obtained. Alignment:  Normal Vertebrae: There are numerous marrow lesions of the visualized cervical spine and thoracic spine. In the incompletely visualized cervical spine, there are lesions at C6 and C7. The C7 lesion extends into the left pedicle and posterior elements. In the thoracic spine there are lesions at T2 and at all levels from T5-T12. There is also a lesion at L1. Most of the lesions are confined to the vertebral body. The lesion at T10 extends into the left pedicle and lamina. Smaller lesions at T8 and T9 also extend into the left pedicles. There is a ventral epidural component of the T10 tumor. Cord:  Normal signal and caliber. Paraspinal and  other soft tissues: Thoracic soft tissues are better assessed on the concomitant chest CT. Disc levels: Limited assessment due to absence of axial imaging. No spinal canal stenosis. IMPRESSION: 1. 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 2. The  largest lesion is located at T10, where there is also a component of ventral epidural tumor. 3. No spinal canal stenosis. Electronically Signed   By: Ulyses Jarred M.D.   On: 12/01/2018 19:58   Ct Abdomen Pelvis W Contrast  Result Date: 11/29/2018 CLINICAL DATA:  Upper abdominal pain for several weeks. Smoker. No history of primary malignancy per the medical record. EXAM: CT ABDOMEN AND PELVIS WITH CONTRAST TECHNIQUE: Multidetector CT imaging of the abdomen and pelvis was performed using the standard protocol following bolus administration of intravenous contrast. CONTRAST:  186m OMNIPAQUE IOHEXOL 300 MG/ML  SOLN COMPARISON:  None. FINDINGS: Lower chest: Clear lung bases. Tiny left pleural effusion with pleural irregularity, most apparent on image 3/4. Normal heart size with right coronary artery atherosclerosis. Hepatobiliary: Hepatomegaly at 21.6 cm craniocaudal. Irregular hepatic capsule with caudate and lateral segment left liver lobe enlargement. Heterogeneous density throughout the liver, with more well-defined hypoattenuating lesions including in the high left hepatic lobe at 2.1 cm on image 15/3 and in the central right hepatic lobe at 1.8 cm on the same image. The gallbladder is contracted and appears thick walled. No pericholecystic edema. No biliary duct dilatation. Pancreas: Normal, without mass or ductal dilatation. Spleen: Normal in size, without focal abnormality. Adrenals/Urinary Tract: Left greater than right adrenal thickening. Too small to characterize lesions in both kidneys. No hydronephrosis. Normal urinary bladder. Stomach/Bowel: Normal stomach, without wall thickening. Scattered colonic diverticula. Normal terminal ileum and appendix. Normal small bowel. Vascular/Lymphatic: Aortic and branch vessel atherosclerosis. Patent portal and splenic veins. No specific evidence of portal venous hypertension. Extensive abdominal adenopathy. Index aortocaval node of 2.2 cm on image 36/3. Porta  hepatis adenopathy at 2.4 cm on image 28/3. Retrocrural adenopathy at 1.1 cm on image 20/3. No pelvic sidewall adenopathy. Reproductive: Normal prostate. Other: Small volume abdominopelvic ascites. Perihepatic nodularity may represent small lymph nodes or omental metastasis including at 9 mm on image 43/3. Right inguinal hernia contains fat. Musculoskeletal: Anterior left seventh rib nonacute fracture. Lower thoracic spondylosis. IMPRESSION: 1. 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 Widespread metastatic disease from an otherwise unknown primary is possible but felt less likely. 2. Upper abdominal and lower thoracic adenopathy, most consistent with metastatic disease. 3. Small left pleural effusion with findings suspicious for pleural metastasis. 4. Probable perihepatic omental/peritoneal metastasis. 5. Coronary artery atherosclerosis. Aortic Atherosclerosis (ICD10-I70.0). 6. Small volume abdominopelvic ascites. Electronically Signed   By: KAbigail MiyamotoM.D.   On: 11/29/2018 14:34   [place] (liver)  Result Date: 12/01/2018 INDICATION: [age]year old male with a history of multiple liver lesions unknown primary cancer EXAM: ULTRASOUND-GUIDED BIOPSY LIVER MASS MEDICATIONS: None. ANESTHESIA/SEDATION: Moderate (conscious) sedation was employed during this procedure. A total of Versed 1.5 mg and Fentanyl 75 mcg was administered intravenously. Moderate Sedation Time: 10 minutes. The patient's level of consciousness and vital signs were monitored continuously by radiology nursing throughout the procedure under my direct supervision. FLUOROSCOPY TIME:  None COMPLICATIONS: None PROCEDURE: Informed written consent was obtained from the patient after a thorough discussion of the procedural risks, benefits and alternatives. All questions were addressed. Maximal Sterile Barrier Technique was utilized including caps, mask, sterile gowns, sterile gloves, sterile drape,  hand hygiene and skin antiseptic. A  timeout was performed prior to the initiation of the procedure. Patient positioned supine position on the ultrasound table. Ultrasound survey of the liver performed with images stored sent to PACs. Patient is prepped and draped in the usual sterile fashion. 1% lidocaine was used for local anesthesia. Using ultrasound guidance, 17 gauge guide needle was advanced into heterogeneously hypoechoic mass of the right liver. Once we confirmed position of the needle tip, multiple 18 gauge core biopsy were achieved. Two Gel-Foam pledgets were infused with a small amount of saline. Needle was removed and a final image was stored. Patient tolerated the procedure well and remained hemodynamically stable throughout. No complications were encountered and no significant blood loss. IMPRESSION: Status post ultrasound-guided biopsy of right liver lesion. Tissue specimen sent to pathology for complete histopathologic analysis. Signed, Dulcy Fanny. Dellia Nims, RPVI [facility] Electronically Signed   By: Corrie Mckusick D.O.   On: 12/01/2018 17:24       Subjective: Patient reports pain is reasonably well controlled.  Patient will follow-up with oncology as an outpatient.  He reported he prefer to be discharged today.  Discharge Exam: Vitals:   12/01/18 2139 12/02/18 0507  BP: (!) 173/75 (!) 150/78  Pulse: 65 78  Resp: 19 18  Temp: 97.8 F (36.6 C) 97.8 F (36.6 C)  SpO2: 94% 93%   Vitals:   12/01/18 1640 12/01/18 1645 12/01/18 2139 12/02/18 0507  BP: (!) 160/81 (!) 152/78 (!) 173/75 (!) 150/78  Pulse: 67 68 65 78  Resp: 19 16 19 18   Temp:   97.8 F (36.6 C) 97.8 F (36.6 C)  TempSrc:   Oral Oral  SpO2: 97% 93% 94% 93%  Weight:      Height:        General: Pt is alert, awake, not in acute distress Cardiovascular: RRR, S1/S2 +, no rubs, no gallops Respiratory: CTA bilaterally, no wheezing, no rhonchi Abdominal:  Soft, mildly tender to palpation., ND, bowel sounds + Extremities: no edema, no cyanosis    The results of significant diagnostics from this hospitalization (including imaging, microbiology, ancillary and laboratory) are listed below for reference.     Microbiology: No results found for this or any previous visit (from the past 240 hour(s)).   Labs: BNP (last 3 results) No results for input(s): BNP in the last 8760 hours. Basic Metabolic Panel: Recent Labs  Lab 11/29/18 0955 11/29/18 1833 12/01/18 0118 12/02/18 0410  NA 135  --  137 136  K 3.4*  --  4.0 3.9  CL 101  --  101 102  CO2 23  --  26 25  GLUCOSE 106*  --  79 78  BUN 24*  --  13 9  CREATININE 1.32* 1.25* 0.81 0.89  CALCIUM 9.4  --  8.5* 8.6*   Liver Function Tests: Recent Labs  Lab 11/29/18 0955 12/01/18 0118 12/02/18 0410  AST 94* 91* 101*  ALT 101* 96* 103*  ALKPHOS 286* 260* 272*  BILITOT 1.3* 1.5* 2.3*  PROT 7.1 6.4* 6.0*  ALBUMIN 3.7 3.1* 2.8*   Recent Labs  Lab 11/29/18 0955  LIPASE 146*   No results for input(s): AMMONIA in the last 168 hours. CBC: Recent Labs  Lab 11/29/18 0955 11/29/18 1833  WBC 6.5 6.3  NEUTROABS 4.6  --   HGB 15.0 14.6  HCT 45.1 44.1  MCV 94.2 94.4  PLT 187 191   Cardiac Enzymes: No results for input(s): CKTOTAL, CKMB, CKMBINDEX, TROPONINI in the last  168 hours. BNP: Invalid input(s): POCBNP CBG: No results for input(s): GLUCAP in the last 168 hours. D-Dimer No results for input(s): DDIMER in the last 72 hours. Hgb A1c No results for input(s): HGBA1C in the last 72 hours. Lipid Profile No results for input(s): CHOL, HDL, LDLCALC, TRIG, CHOLHDL, LDLDIRECT in the last 72 hours. Thyroid function studies No results for input(s): TSH, T4TOTAL, T3FREE, THYROIDAB in the last 72 hours.  Invalid input(s): FREET3 Anemia work up No results for input(s): VITAMINB12, FOLATE, FERRITIN, TIBC, IRON, RETICCTPCT in the last 72 hours. Urinalysis    Component Value  Date/Time   COLORURINE YELLOW 11/29/2018 0957   APPEARANCEUR CLEAR 11/29/2018 0957   LABSPEC 1.015 11/29/2018 0957   PHURINE 5.0 11/29/2018 0957   GLUCOSEU 50 (A) 11/29/2018 0957   HGBUR NEGATIVE 11/29/2018 0957   BILIRUBINUR NEGATIVE 11/29/2018 0957   KETONESUR NEGATIVE 11/29/2018 0957   PROTEINUR 100 (A) 11/29/2018 0957   NITRITE NEGATIVE 11/29/2018 0957   LEUKOCYTESUR NEGATIVE 11/29/2018 0957   Sepsis Labs Invalid input(s): PROCALCITONIN,  WBC,  LACTICIDVEN Microbiology No results found for this or any previous visit (from the past 240 hour(s)).   Time coordinating discharge: Over 30 minutes  SIGNED:   Nicolette Bang, MD  [facility] 12/02/2018, 1:42 PM Pager   If 7PM-7AM, please contact night-coverage [email or web address] Password [email or web address]

## 2018-12-03 ENCOUNTER — Telehealth: Payer: Self-pay | Admitting: Hematology

## 2018-12-03 NOTE — Telephone Encounter (Signed)
A hospital follow up appt has been scheduled for the pt to see Dr. Irene Limbo on 12/19 at 12pm. Per msg from Dr. Irene Limbo pt needed to be scheduled in 5-7 days. Pt stated that his sister would be in town all next week and wanted to be at the appt with him. Mr. Bellanca has been made aware to arrive 15 minutes early to be checked in.

## 2018-12-08 ENCOUNTER — Other Ambulatory Visit: Payer: Self-pay | Admitting: Hematology

## 2018-12-08 MED ORDER — OXYCODONE HCL 5 MG PO TABS: 5.0000 mg | ORAL_TABLET | ORAL | 0 refills | Status: DC | PRN

## 2018-12-09 ENCOUNTER — Telehealth: Payer: Self-pay | Admitting: *Deleted

## 2018-12-09 NOTE — Progress Notes (Signed)
Wasted 1 mg morphine in stericycle bin.

## 2018-12-09 NOTE — Telephone Encounter (Signed)
Patient left voice mail: requested refill of IR oxycodone. Contacted patient. Patient ran out of med given by hospitalist. Information given to Dr. Irene Limbo. Refill sent to pharmacy. Patient notified, [facility] states [facility], [facility].

## 2018-12-10 NOTE — Progress Notes (Signed)
HEMATOLOGY/ONCOLOGY CONSULTATION NOTE  Date of Service: 12/11/2018  Patient Care Team: Patient, No Pcp Per as PCP - General (General Practice)  CHIEF COMPLAINTS/PURPOSE OF CONSULTATION:  Newly Diagnosed Small Cell Lung Cancer  HISTORY OF PRESENTING ILLNESS:  Cody Montoya a 63 y.o.malewith medical history significant ofalcohol abuse.He does not have a primary care provider and does not seek routine medical care. Around 3 weeks ago, the patient started having epigastric abdominal pain. It would wax and wane in severity but progressively worsenedsince onset. He has had associated nausea and vomiting. His appetite has decreased and he is lost around 10 pounds. He stopped drinking when this happened. He would drink around 7-8 shots of vodka daily in addition to the 2-3 beers. He has done this for the past 4-5 years. He does not have any known history of hepatitis B or C and denies taking any medication/supplements routinely. He denies any prominent vessels, yellowing of the skin/eyes, fevers, shortness of breath, or bowel changes. The patient has been using ibuprofen for pain with little relief.  In the emergency room, a CT scan showsmultifocal hepatocellular carcinoma that is not appear to be metastatic to the liver. There is also thoracic adenopathy,omental/peritoneal involvement and left pleural effusion suggestive of metastatic disease. The oncology team was called and recommended admission for work-up.   When seen today, the patient reports that he continues to have abdominal discomfort in his upper abdomen.  Along with the discomfort in his abdomen, he also reports nausea and vomiting and increased constipation.  This is been present for about 2 to 3 weeks.  He states his appetite has been decreased and he has lost weight.  He estimates that he has lost about 10 pounds over the past few months.  Oncology was asked to see the patient to make recommendations  regarding his liver masses.  Interval History:   Cody Montoya returns today for management and evaluation of his newly diagnosed Small Cell Lung Cancer. I last saw the pt on 12/02/18 as an inpatient. He is accompanied today by his sister, Vickie. The pt reports that he is doing well overall.   The pt reports that he has continued to have back pain, which presents more severely intermittently. The pt notes that his 12 hour pain medication wears off a couple hours early. He notes that he takes his short acting pain medication every 4-6 hours, and his pain mostly well controlled. He denies headaches, changes in vision or changes in speech.   The pt has continued with complete alcohol cessation, and is now at about one month of this.  He notes that he has had a lack of appetite and senses that his food "hangs," in his food pipe within his chest, but denies food getting stuck.  Of note prior to the patient's visit today, pt has had CT Chest completed on 12/01/18 with results revealing Large left upper lobe lung mass is identified with invasion of the mediastinum and left hilum. Associated left-sided pleural metastasis with pleural nodularity and effusion noted. Ipsilateral hilar and bilateral mediastinal nodal metastasis. Left supraclavicular metastasis also noted. 2. Lytic lesion involving the posterior aspect of the approximate T11 vertebra with suspected epidural tumor involvement noted. Advise further evaluation with contrast enhanced MRI of the thoracic spine. 3. Upper abdominal metastasis as described on 11/29/2018.  Most recent lab results (11/29/18) of CBC is as follows: all values are WNL. 12/02/18 CMP revealed all values WNL except for Calcium at 8.6, Total Protein  at 6.0, Albumin at 2.8, AST at 101, ALT at 103, Alk Phos at 272, Total Bilirubin at 2.3  On review of systems, pt reports back pain, weak appetite, stable energy levels, constipation, and denies headaches, changes in vision,  changes in speech, and any other symptoms.   MEDICAL HISTORY:  Past Medical History:  Diagnosis Date   Cancer [facility])     SURGICAL HISTORY: Past Surgical History:  Procedure Laterality Date   NO PAST SURGERIES      SOCIAL HISTORY: Social History   Socioeconomic History   Marital status: Single    Spouse name: Not on file   Number of children: Not on file   Years of education: Not on file   Highest education level: Not on file  Occupational History   Occupation: Surveyor, quantity  Social Needs   Financial resource strain: Not very hard   Food insecurity:    Worry: Never true    Inability: Never true   Transportation needs:    Medical: No    Non-medical: No  Tobacco Use   Smoking status: Current Every Day Smoker    Packs/day: 1.00    Years: 45.00    Pack years: 45.00    Types: Cigarettes  Substance and Sexual Activity   Alcohol use: Yes    Alcohol/week: 8.0 standard drinks    Types: 3 Cans of beer, 5 Shots of liquor per week    Comment: no alcohol in past month due to symptoms   Drug use: Not Currently    Frequency: 7.0 times per week    Types: Marijuana   Sexual activity: Not Currently  Lifestyle   Physical activity:    Days per week: 5 days    Minutes per session: 150+ min   Stress: To some extent  Relationships   Social connections:    Talks on phone: More than three times a week    Gets together: More than three times a week    Attends religious service: Never    Active member of club or organization: No    Attends meetings of clubs or organizations: Never    Relationship status: Living with partner   Intimate partner violence:    Fear of current or ex partner: No    Emotionally abused: No    Physically abused: No    Forced sexual activity: No  Other Topics Concern   Not on file  Social History Narrative   Not on file    FAMILY HISTORY: No family history on file.  ALLERGIES:  has No Known Allergies.  MEDICATIONS:    Current Outpatient Medications  Medication Sig Dispense Refill   docusate sodium (COLACE) 100 MG capsule Take 1 capsule (100 mg total) by mouth 2 (two) times daily. 10 capsule 0   ibuprofen (ADVIL,MOTRIN) 200 MG tablet Take 800 mg by mouth every 6 (six) hours as needed for fever or moderate pain.     oxyCODONE (OXY IR/ROXICODONE) 5 MG immediate release tablet Take 1 tablet (5 mg total) by mouth every 4 (four) hours as needed for up to 7 days for moderate pain. 30 tablet 0   oxyCODONE (OXYCONTIN) 10 mg 12 hr tablet Take 1 tablet (10 mg total) by mouth every 12 (twelve) hours for 30 days. For cancer pain 60 tablet 0   polyethylene glycol (MIRALAX / GLYCOLAX) packet Take 17 g by mouth daily as needed for mild constipation. 14 each 0   No current facility-administered medications for this visit.  REVIEW OF SYSTEMS:    10 Point review of Systems was done is negative except as noted above.  PHYSICAL EXAMINATION: ECOG PERFORMANCE STATUS: 1-2  . Vitals:   12/11/18 1150  BP: 139/78  Pulse: 96  Resp: 18  Temp: 97.8 F (36.6 C)  SpO2: 97%   Filed Weights   12/11/18 1150  Weight: 199 lb 9.6 oz (90.5 kg)   .Body mass index is 28.64 kg/m.  GENERAL:alert, in no acute distress and comfortable SKIN: no acute rashes, no significant lesions EYES: conjunctiva are pink and non-injected, sclera anicteric OROPHARYNX: MMM, no exudates, no oropharyngeal erythema or ulceration NECK: supple, no JVD LYMPH:  no palpable lymphadenopathy in the cervical, axillary or inguinal regions LUNGS: clear to auscultation b/l with normal respiratory effort HEART: regular rate & rhythm ABDOMEN:  normoactive bowel sounds , non tender, not distended. Extremity: no pedal edema PSYCH: alert & oriented x 3 with fluent speech NEURO: no focal motor/sensory deficits  LABORATORY DATA:  I have reviewed the data as listed  . CBC Latest Ref Rng & Units 11/29/2018 11/29/2018  WBC 4.0 - 10.5 K/uL 6.3 6.5   Hemoglobin 13.0 - 17.0 g/dL 14.6 15.0  Hematocrit 39.0 - 52.0 % 44.1 45.1  Platelets 150 - 400 K/uL 191 187    . CMP Latest Ref Rng & Units 12/02/2018 12/01/2018 11/29/2018  Glucose 70 - 99 mg/dL 78 79 -  BUN 8 - 23 mg/dL 9 13 -  Creatinine 0.61 - 1.24 mg/dL 0.89 0.81 1.25(H)  Sodium 135 - 145 mmol/L 136 137 -  Potassium 3.5 - 5.1 mmol/L 3.9 4.0 -  Chloride 98 - 111 mmol/L 102 101 -  CO2 22 - 32 mmol/L 25 26 -  Calcium 8.9 - 10.3 mg/dL 8.6(L) 8.5(L) -  Total Protein 6.5 - 8.1 g/dL 6.0(L) 6.4(L) -  Total Bilirubin 0.3 - 1.2 mg/dL 2.3(H) 1.5(H) -  Alkaline Phos 38 - 126 U/L 272(H) 260(H) -  AST 15 - 41 U/L 101(H) 91(H) -  ALT 0 - 44 U/L 103(H) 96(H) -       RADIOGRAPHIC STUDIES: I have personally reviewed the radiological images as listed and agreed with the findings in the report. Ct Chest W Contrast  Result Date: 12/01/2018 CLINICAL DATA:  Staging for new liver neoplasm found on CT EXAM: CT CHEST WITH CONTRAST TECHNIQUE: Multidetector CT imaging of the chest was performed during intravenous contrast administration. CONTRAST:  41m OMNIPAQUE IOHEXOL 300 MG/ML  SOLN COMPARISON:  None. FINDINGS: Cardiovascular: The heart size appears within normal limits. No pericardial effusion identified. Mild aortic atherosclerosis. Three vessel coronary artery atherosclerotic calcifications identified. Mediastinum/Nodes: Left supraclavicular lymph node is prominent measuring 1 cm, image 5/3. Left sided pre-vascular lymph node within the superior mediastinum measures 1.6 cm, image 33/3. Right paratracheal lymph node measures 1.8 cm, image 57/3. No subcarinal or right hilar adenopathy. Low left paratracheal lymph node measures 2.2 cm, image 61/3. Lungs/Pleura: Small left pleural effusion. Within the medial left upper lobe there is a paramediastinal mass measuring 6.6 x 6.2 by 10.0 cm. Tumor invades the mediastinum and left hilum. Encasement and complete occlusion of the left upper lobe bronchus identified.  There is also encasement of the left pulmonary artery. Complete occlusion of left upper pulmonary vein identified. Subpleural nodule within the lateral left upper lobe measures 5 mm, multiple small pleural nodules are identified overlying the left lung compatible with pleural metastasis, for example image 101/7. 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 See report from 11/29/2018 for details. Again seen is ascites, bilateral adrenal gland enlargement and abdominal adenopathy. Musculoskeletal: Chronic bilateral posttraumatic rib deformities are noted. There is a suspicious lytic lesion involving the approximate T11 vertebra with extension into the left posterior aspect of the canal, image 90/6. Here there is a epidural soft tissue component measuring 1.1 cm, image 90/6., image 118/3. IMPRESSION: 1. Large left upper lobe lung mass is identified with invasion of the mediastinum and left hilum. Associated left-sided pleural metastasis with pleural nodularity and effusion noted. Ipsilateral hilar and bilateral mediastinal nodal metastasis. Left supraclavicular metastasis also noted. 2. Lytic lesion involving the posterior aspect of the approximate T11 vertebra with suspected epidural tumor involvement noted. Advise further evaluation with contrast enhanced MRI of the thoracic spine. 3. Upper abdominal metastasis as described on 11/29/2018. 4. These results will be called to the ordering clinician or representative by the Radiologist Assistant, and communication documented in the PACS or zVision Dashboard. Electronically Signed   By: Kerby Moors M.D.   On: 12/01/2018 14:01   Mr Thoracic Spine Wo Contrast  Result Date: 12/01/2018 CLINICAL DATA:  Metastatic disease of thoracic spine. EXAM: MRI THORACIC SPINE WITHOUT CONTRAST TECHNIQUE: Multiplanar, multisequence MR imaging of the thoracic  spine was performed. No intravenous contrast was administered. COMPARISON:  Chest CT 12/01/2018 FINDINGS: The examination had to be discontinued prior to completion due to patient pain following liver biopsy. Only sagittal sequences were obtained. Alignment:  Normal Vertebrae: There are numerous marrow lesions of the visualized cervical spine and thoracic spine. In the incompletely visualized cervical spine, there are lesions at C6 and C7. The C7 lesion extends into the left pedicle and posterior elements. In the thoracic spine there are lesions at T2 and at all levels from T5-T12. There is also a lesion at L1. Most of the lesions are confined to the vertebral body. The lesion at T10 extends into the left pedicle and lamina. Smaller lesions at T8 and T9 also extend into the left pedicles. There is a ventral epidural component of the T10 tumor. Cord:  Normal signal and caliber. Paraspinal and other soft tissues: Thoracic soft tissues are better assessed on the concomitant chest CT. Disc levels: Limited assessment due to absence of axial imaging. No spinal canal stenosis. IMPRESSION: 1. 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 2. The largest lesion is located at T10, where there is also a component of ventral epidural tumor. 3. No spinal canal stenosis. Electronically Signed   By: Ulyses Jarred M.D.   On: 12/01/2018 19:58   Ct Abdomen Pelvis W Contrast  Result Date: 11/29/2018 CLINICAL DATA:  Upper abdominal pain for several weeks. Smoker. No history of primary malignancy per the medical record. EXAM: CT ABDOMEN AND PELVIS WITH CONTRAST TECHNIQUE: Multidetector CT imaging of the abdomen and pelvis was performed using the standard protocol following bolus administration of intravenous contrast. CONTRAST:  143m OMNIPAQUE IOHEXOL 300 MG/ML  SOLN COMPARISON:  None. FINDINGS: Lower chest: Clear lung bases. Tiny left pleural effusion with pleural irregularity, most apparent  on image 3/4. Normal heart size with right coronary artery atherosclerosis. Hepatobiliary: Hepatomegaly at 21.6 cm craniocaudal. Irregular hepatic capsule with caudate and lateral segment left liver lobe enlargement. Heterogeneous density throughout the liver, with more well-defined hypoattenuating lesions including in the high left hepatic lobe at 2.1 cm on image 15/3 and in the central right hepatic lobe at 1.8 cm on  the same image. The gallbladder is contracted and appears thick walled. No pericholecystic edema. No biliary duct dilatation. Pancreas: Normal, without mass or ductal dilatation. Spleen: Normal in size, without focal abnormality. Adrenals/Urinary Tract: Left greater than right adrenal thickening. Too small to characterize lesions in both kidneys. No hydronephrosis. Normal urinary bladder. Stomach/Bowel: Normal stomach, without wall thickening. Scattered colonic diverticula. Normal terminal ileum and appendix. Normal small bowel. Vascular/Lymphatic: Aortic and branch vessel atherosclerosis. Patent portal and splenic veins. No specific evidence of portal venous hypertension. Extensive abdominal adenopathy. Index aortocaval node of 2.2 cm on image 36/3. Porta hepatis adenopathy at 2.4 cm on image 28/3. Retrocrural adenopathy at 1.1 cm on image 20/3. No pelvic sidewall adenopathy. Reproductive: Normal prostate. Other: Small volume abdominopelvic ascites. Perihepatic nodularity may represent small lymph nodes or omental metastasis including at 9 mm on image 43/3. Right inguinal hernia contains fat. Musculoskeletal: Anterior left seventh rib nonacute fracture. Lower thoracic spondylosis. IMPRESSION: 1. 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 Widespread metastatic disease from an otherwise unknown primary is possible but felt less likely. 2. Upper abdominal and lower thoracic adenopathy, most consistent with metastatic disease. 3. Small left pleural  effusion with findings suspicious for pleural metastasis. 4. Probable perihepatic omental/peritoneal metastasis. 5. Coronary artery atherosclerosis. Aortic Atherosclerosis (ICD10-I70.0). 6. Small volume abdominopelvic ascites. Electronically Signed   By: Abigail Miyamoto M.D.   On: 11/29/2018 14:34   US Biopsy (liver)  Result Date: 12/01/2018 INDICATION: 63 year old male with a history of multiple liver lesions unknown primary cancer EXAM: ULTRASOUND-GUIDED BIOPSY LIVER MASS MEDICATIONS: None. ANESTHESIA/SEDATION: Moderate (conscious) sedation was employed during this procedure. A total of Versed 1.5 mg and Fentanyl 75 mcg was administered intravenously. Moderate Sedation Time: 10 minutes. The patient's level of consciousness and vital signs were monitored continuously by radiology nursing throughout the procedure under my direct supervision. FLUOROSCOPY TIME:  None COMPLICATIONS: None PROCEDURE: Informed written consent was obtained from the patient after a thorough discussion of the procedural risks, benefits and alternatives. All questions were addressed. Maximal Sterile Barrier Technique was utilized including caps, mask, sterile gowns, sterile gloves, sterile drape, hand hygiene and skin antiseptic. A timeout was performed prior to the initiation of the procedure. Patient positioned supine position on the ultrasound table. Ultrasound survey of the liver performed with images stored sent to PACs. Patient is prepped and draped in the usual sterile fashion. 1% lidocaine was used for local anesthesia. Using ultrasound guidance, 17 gauge guide needle was advanced into heterogeneously hypoechoic mass of the right liver. Once we confirmed position of the needle tip, multiple 18 gauge core biopsy were achieved. Two Gel-Foam pledgets were infused with a small amount of saline. Needle was removed and a final image was stored. Patient tolerated the procedure well and remained hemodynamically stable throughout. No  complications were encountered and no significant blood loss. IMPRESSION: Status post ultrasound-guided biopsy of right liver lesion. Tissue specimen sent to pathology for complete histopathologic analysis. Signed, Dulcy Fanny. Dellia Nims, RPVI [facility] Radiology Electronically Signed   By: Corrie Mckusick D.O.   On: 12/01/2018 17:24    ASSESSMENT & PLAN:  63 y.o. male with  1. Newly diagnosed Extensive Stage Small Cell Carcinoma   PLAN: -Discussed patient's most recent labs, 11/29/18 CBC revealed all values WNL. 12/02/18 CMP revealed AST at 101, ALT at 103, Alk Phos at 272. -Discussed the 12/01/18 Liver core biopsy revealed Small cell carcinoma with a Ki67 of 50% -Discussed the  12/01/18 CT Chest which revealed Large left upper lobe lung mass is identified with invasion of the mediastinum and left hilum. Associated left-sided pleural metastasis with pleural nodularity and effusion noted. Ipsilateral hilar and bilateral mediastinal nodal metastasis. Left supraclavicular metastasis also noted. 2. Lytic lesion involving the posterior aspect of the approximate T11 vertebra with suspected epidural tumor involvement noted. Advise further evaluation with contrast enhanced MRI of the thoracic spine. 3. Upper abdominal metastasis as described on 11/29/2018. -Discussed the 11/29/18 CT A/P which revealed Hepatic findings which are favored to represent cirrhosis and multifocal hepatocellular carcinoma. Widespread metastatic disease from an otherwise unknown primary is possible but felt less likely. 2. Upper abdominal and lower thoracic adenopathy, most consistent with metastatic disease. 3. Small left pleural effusion with findings suspicious for pleural metastasis. 4. Probable perihepatic omental/peritoneal metastasis. 5. Coronary artery atherosclerosis. Aortic Atherosclerosis. 6. Small volume abdominopelvic ascites. -Discussed that the patient has Extensive Stage small cell  lung cancer -Discussed the intent of treatment as palliative to control the symptoms and curb progression, and discussed that this condition is not curable. Also discussed that the pt has the option to focus purely on comfort measures through hospice, at any point. -Discussed that the patient may have a baseline alcohol related fatty liver, which could limit treatment dosing, but will be mindful of this with each cycle -Discussed treatment option of up to 6 cycles of Carboplatin, and Etoposide every 3 weeks. Would consider adding on Atezolizumab with C2 to ensure C1 tolerance. Will need to add ctx dosing per liver function. -Recommend eating soft foods -Recommend pt arrange his affairs, and arrange a living will and legal proxy to ensure his wishes are met -The pt would not want chest compressions nor to be placed on a breathing machine, if necessary. DNAR order placed per patients request, -Provided contact information to our social worker -Will order PET/CT and MRI Brain to complete staging and complete characterization -Will refer the pt to IR for port placement -Will refer the pt to chemotherapy counseling -Will tentatively start C1 Carboplatin and Etoposide within 3-7 days -Will see the pt back on C1D1   Social worker consultation - no insurance Chemo-counseling for carboplatin/Etoposide/Atezolizumab Needs to start Carboplatin/Etoposide/Atezolizumab chemotherapy ASAP in 3-5 days for extensive stage lung cancer IR for port a cath placement RTC wth dr Irene Limbo with labs on C1D1 of treatment MRI brain stat to evaluate for brain mets   All of the patients questions were answered with apparent satisfaction. The patient knows to call the clinic with any problems, questions or concerns.  The total time spent in the appt was 60 minutes and more than 50% was on counseling and direct patient cares.    Sullivan Lone MD MS AAHIVMS [facility] Hematology/Oncology Physician [facility]  (Office):       [phone] (Work cell):  [phone] (Fax):           [phone]  12/11/2018 1:05 PM  I, Baldwin [place], am acting as a scribe for Dr. Sullivan Lone.   .I have reviewed the above documentation for accuracy and completeness, and I agree with the above. Brunetta Genera MD

## 2018-12-11 ENCOUNTER — Other Ambulatory Visit: Payer: Self-pay

## 2018-12-11 ENCOUNTER — Telehealth: Payer: Self-pay | Admitting: Hematology

## 2018-12-11 ENCOUNTER — Inpatient Hospital Stay: Payer: Self-pay | Attending: Hematology | Admitting: Hematology

## 2018-12-11 VITALS — BP 139/78 | HR 96 | Temp 97.8°F | Resp 18 | Ht 70.0 in | Wt 199.6 lb

## 2018-12-11 DIAGNOSIS — R112 Nausea with vomiting, unspecified: Secondary | ICD-10-CM | POA: Insufficient documentation

## 2018-12-11 DIAGNOSIS — C787 Secondary malignant neoplasm of liver and intrahepatic bile duct: Secondary | ICD-10-CM | POA: Insufficient documentation

## 2018-12-11 DIAGNOSIS — K5900 Constipation, unspecified: Secondary | ICD-10-CM | POA: Insufficient documentation

## 2018-12-11 DIAGNOSIS — R748 Abnormal levels of other serum enzymes: Secondary | ICD-10-CM | POA: Insufficient documentation

## 2018-12-11 DIAGNOSIS — C782 Secondary malignant neoplasm of pleura: Secondary | ICD-10-CM

## 2018-12-11 DIAGNOSIS — Z5189 Encounter for other specified aftercare: Secondary | ICD-10-CM | POA: Insufficient documentation

## 2018-12-11 DIAGNOSIS — C3411 Malignant neoplasm of upper lobe, right bronchus or lung: Secondary | ICD-10-CM | POA: Insufficient documentation

## 2018-12-11 DIAGNOSIS — C220 Liver cell carcinoma: Secondary | ICD-10-CM

## 2018-12-11 DIAGNOSIS — C778 Secondary and unspecified malignant neoplasm of lymph nodes of multiple regions: Secondary | ICD-10-CM

## 2018-12-11 DIAGNOSIS — Z720 Tobacco use: Secondary | ICD-10-CM | POA: Insufficient documentation

## 2018-12-11 DIAGNOSIS — R634 Abnormal weight loss: Secondary | ICD-10-CM | POA: Insufficient documentation

## 2018-12-11 DIAGNOSIS — Z5111 Encounter for antineoplastic chemotherapy: Secondary | ICD-10-CM | POA: Insufficient documentation

## 2018-12-11 DIAGNOSIS — C3490 Malignant neoplasm of unspecified part of unspecified bronchus or lung: Secondary | ICD-10-CM

## 2018-12-11 DIAGNOSIS — M545 Low back pain: Secondary | ICD-10-CM | POA: Insufficient documentation

## 2018-12-11 NOTE — Telephone Encounter (Signed)
Gave patient avs report and appointment for CHED class tomorrow. Spoke with desk re time frame given for appointments requests per 3/19 los. Desk nurse will confer with provider re if scan/port needs to be completed prior to starting tx. If not treatment may be able to start next week, if so treatment may not be able to start until week of 3/3 due to this will be a 3 day treatment according to los - no care plan at this time.

## 2018-12-11 NOTE — Telephone Encounter (Signed)
Per desk nurse/GK patient needs to start treatment sooner rather than later and it is ok if mri/port placement comes after 1st round of treatment  Per infusion AD ok to start treatment Monday 3/23 if lab can be done tomorrow. Per [place] ok for lab to be done tomorrow and start treatment Monday followed by lab/fu one week after 1st treatment.   Added treatment accordingly per above and treatment listed in 3/19 los - no care plan at this time.  Patient will be contacted re mri/port once orders entered.   Left message for patient. Patient will get updated schedule tomorrow.

## 2018-12-12 ENCOUNTER — Inpatient Hospital Stay: Payer: Self-pay

## 2018-12-12 ENCOUNTER — Telehealth: Payer: Self-pay | Admitting: *Deleted

## 2018-12-12 ENCOUNTER — Other Ambulatory Visit: Payer: Self-pay

## 2018-12-12 DIAGNOSIS — C3490 Malignant neoplasm of unspecified part of unspecified bronchus or lung: Secondary | ICD-10-CM

## 2018-12-12 LAB — CBC WITH DIFFERENTIAL/PLATELET
Abs Immature Granulocytes: 0.03 10*3/uL (ref 0.00–0.07)
Basophils Absolute: 0 10*3/uL (ref 0.0–0.1)
Basophils Relative: 0 %
Eosinophils Absolute: 0 10*3/uL (ref 0.0–0.5)
Eosinophils Relative: 0 %
HCT: 41.6 % (ref 39.0–52.0)
Hemoglobin: 14.6 g/dL (ref 13.0–17.0)
Immature Granulocytes: 0 %
Lymphocytes Relative: 11 %
Lymphs Abs: 1 10*3/uL (ref 0.7–4.0)
MCH: 32.4 pg (ref 26.0–34.0)
MCHC: 35.1 g/dL (ref 30.0–36.0)
MCV: 92.4 fL (ref 80.0–100.0)
Monocytes Absolute: 0.9 10*3/uL (ref 0.1–1.0)
Monocytes Relative: 9 %
Neutro Abs: 7.6 10*3/uL (ref 1.7–7.7)
Neutrophils Relative %: 80 %
Platelets: 264 10*3/uL (ref 150–400)
RBC: 4.5 MIL/uL (ref 4.22–5.81)
RDW: 14.7 % (ref 11.5–15.5)
WBC: 9.6 10*3/uL (ref 4.0–10.5)
nRBC: 0 % (ref 0.0–0.2)

## 2018-12-12 LAB — CMP (CANCER CENTER ONLY)
ALT: 193 U/L — AB (ref 0–44)
AST: 216 U/L (ref 15–41)
Albumin: 2.9 g/dL — ABNORMAL LOW (ref 3.5–5.0)
Alkaline Phosphatase: 815 U/L — ABNORMAL HIGH (ref 38–126)
Anion gap: 14 (ref 5–15)
BUN: 30 mg/dL — ABNORMAL HIGH (ref 8–23)
CO2: 23 mmol/L (ref 22–32)
Calcium: 9.4 mg/dL (ref 8.9–10.3)
Chloride: 96 mmol/L — ABNORMAL LOW (ref 98–111)
Creatinine: 1.13 mg/dL (ref 0.61–1.24)
GFR, Estimated: >60 mL/min (ref >60)
Glucose, Bld: 87 mg/dL (ref 70–99)
Potassium: 5.6 mmol/L — ABNORMAL HIGH (ref 3.5–5.1)
Sodium: 133 mmol/L — ABNORMAL LOW (ref 135–145)
Total Bilirubin: 18.6 mg/dL (ref 0.3–1.2)
Total Protein: 7.3 g/dL (ref 6.5–8.1)

## 2018-12-12 LAB — LACTATE DEHYDROGENASE: LDH: 352 U/L — ABNORMAL HIGH (ref 98–192)

## 2018-12-12 NOTE — Telephone Encounter (Signed)
Panic lab results called into the infusion room. Dr. Irene Limbo notified on cell phone. No new orders given.

## 2018-12-15 ENCOUNTER — Other Ambulatory Visit: Payer: Self-pay | Admitting: Oncology

## 2018-12-15 ENCOUNTER — Telehealth: Payer: Self-pay | Admitting: Hematology

## 2018-12-15 ENCOUNTER — Other Ambulatory Visit: Payer: Self-pay | Admitting: Hematology

## 2018-12-15 ENCOUNTER — Other Ambulatory Visit: Payer: Self-pay

## 2018-12-15 ENCOUNTER — Inpatient Hospital Stay: Payer: Self-pay

## 2018-12-15 ENCOUNTER — Inpatient Hospital Stay (HOSPITAL_BASED_OUTPATIENT_CLINIC_OR_DEPARTMENT_OTHER): Payer: Self-pay | Admitting: Hematology

## 2018-12-15 VITALS — BP 139/80 | HR 87 | Temp 98.3°F | Resp 18

## 2018-12-15 DIAGNOSIS — C3490 Malignant neoplasm of unspecified part of unspecified bronchus or lung: Secondary | ICD-10-CM

## 2018-12-15 DIAGNOSIS — R748 Abnormal levels of other serum enzymes: Secondary | ICD-10-CM

## 2018-12-15 DIAGNOSIS — Z7189 Other specified counseling: Secondary | ICD-10-CM

## 2018-12-15 DIAGNOSIS — C787 Secondary malignant neoplasm of liver and intrahepatic bile duct: Secondary | ICD-10-CM

## 2018-12-15 DIAGNOSIS — Z720 Tobacco use: Secondary | ICD-10-CM

## 2018-12-15 DIAGNOSIS — C3411 Malignant neoplasm of upper lobe, right bronchus or lung: Secondary | ICD-10-CM

## 2018-12-15 LAB — CMP (CANCER CENTER ONLY)
ALK PHOS: 881 U/L — AB (ref 38–126)
ALT: 194 U/L — ABNORMAL HIGH (ref 0–44)
ANION GAP: 15 (ref 5–15)
AST: 206 U/L (ref 15–41)
Albumin: 2.7 g/dL — ABNORMAL LOW (ref 3.5–5.0)
BUN: 26 mg/dL — ABNORMAL HIGH (ref 8–23)
CALCIUM: 9.3 mg/dL (ref 8.9–10.3)
CO2: 22 mmol/L (ref 22–32)
Chloride: 95 mmol/L — ABNORMAL LOW (ref 98–111)
Creatinine: 0.98 mg/dL (ref 0.61–1.24)
GFR, Est AFR Am: >60 mL/min (ref >60)
GFR, Estimated: >60 mL/min (ref >60)
Glucose, Bld: 104 mg/dL — ABNORMAL HIGH (ref 70–99)
Potassium: 4.4 mmol/L (ref 3.5–5.1)
Sodium: 132 mmol/L — ABNORMAL LOW (ref 135–145)
Total Bilirubin: 23.8 mg/dL (ref 0.3–1.2)
Total Protein: 7 g/dL (ref 6.5–8.1)

## 2018-12-15 LAB — CBC WITH DIFFERENTIAL (CANCER CENTER ONLY)
Abs Immature Granulocytes: 0.04 10*3/uL (ref 0.00–0.07)
Basophils Absolute: 0 10*3/uL (ref 0.0–0.1)
Basophils Relative: 0 %
Eosinophils Absolute: 0 10*3/uL (ref 0.0–0.5)
Eosinophils Relative: 0 %
HEMATOCRIT: 41.3 % (ref 39.0–52.0)
HEMOGLOBIN: 14.4 g/dL (ref 13.0–17.0)
Immature Granulocytes: 0 %
LYMPHS PCT: 9 %
Lymphs Abs: 0.9 10*3/uL (ref 0.7–4.0)
MCH: 32.1 pg (ref 26.0–34.0)
MCHC: 34.9 g/dL (ref 30.0–36.0)
MCV: 92.2 fL (ref 80.0–100.0)
Monocytes Absolute: 0.7 10*3/uL (ref 0.1–1.0)
Monocytes Relative: 7 %
NEUTROS ABS: 8.2 10*3/uL — AB (ref 1.7–7.7)
Neutrophils Relative %: 84 %
Platelet Count: 245 10*3/uL (ref 150–400)
RBC: 4.48 MIL/uL (ref 4.22–5.81)
RDW: 15.4 % (ref 11.5–15.5)
WBC Count: 9.8 10*3/uL (ref 4.0–10.5)
nRBC: 0 % (ref 0.0–0.2)

## 2018-12-15 LAB — URIC ACID: Uric Acid, Serum: 9 mg/dL — ABNORMAL HIGH (ref 3.7–8.6)

## 2018-12-15 LAB — TSH: TSH: 2.995 u[IU]/mL (ref 0.320–4.118)

## 2018-12-15 MED ORDER — OXYCODONE HCL 5 MG PO TABS
5.0000 mg | ORAL_TABLET | Freq: Once | ORAL | Status: DC
  Filled 2018-12-15: qty 1

## 2018-12-15 MED ORDER — SODIUM CHLORIDE 0.9 % IV SOLN
335.4000 mg | Freq: Once | INTRAVENOUS | Status: AC
  Administered 2018-12-15: 340 mg via INTRAVENOUS
  Filled 2018-12-15: qty 34

## 2018-12-15 MED ORDER — SODIUM CHLORIDE 0.9 % IV SOLN
75.0000 mg/m2 | Freq: Once | INTRAVENOUS | Status: AC
  Administered 2018-12-15: 160 mg via INTRAVENOUS
  Filled 2018-12-15: qty 8

## 2018-12-15 MED ORDER — SODIUM CHLORIDE 0.9 % IV SOLN
Freq: Once | INTRAVENOUS | Status: AC
  Administered 2018-12-15: 11:00:00 via INTRAVENOUS
  Filled 2018-12-15: qty 250

## 2018-12-15 MED ORDER — SODIUM CHLORIDE 0.9 % IV SOLN
Freq: Once | INTRAVENOUS | Status: AC
  Administered 2018-12-15: 11:00:00 via INTRAVENOUS
  Filled 2018-12-15: qty 5

## 2018-12-15 MED ORDER — PALONOSETRON HCL INJECTION 0.25 MG/5ML
INTRAVENOUS | Status: AC
  Filled 2018-12-15: qty 5

## 2018-12-15 MED ORDER — OXYCODONE-ACETAMINOPHEN 5-325 MG PO TABS
1.0000 | ORAL_TABLET | Freq: Once | ORAL | Status: AC
  Administered 2018-12-15: 1 via ORAL

## 2018-12-15 MED ORDER — OXYCODONE HCL 5 MG PO TABS: 5.0000 mg | ORAL_TABLET | ORAL | 0 refills | Status: DC | PRN

## 2018-12-15 MED ORDER — OXYCODONE-ACETAMINOPHEN 5-325 MG PO TABS
ORAL_TABLET | ORAL | Status: AC
  Filled 2018-12-15: qty 1

## 2018-12-15 MED ORDER — ONDANSETRON HCL 8 MG PO TABS: 8.0000 mg | ORAL_TABLET | Freq: Two times a day (BID) | ORAL | 1 refills | Status: DC | PRN

## 2018-12-15 MED ORDER — PALONOSETRON HCL INJECTION 0.25 MG/5ML
0.2500 mg | Freq: Once | INTRAVENOUS | Status: AC
  Administered 2018-12-15: 0.25 mg via INTRAVENOUS

## 2018-12-15 NOTE — Progress Notes (Unsigned)
I was called by the answering service with the patient's wife stating the patient had run out of his breakthrough medication and was very short on the OxyContin.  I reviewed PMP aware and the patient did receive 30 tablets of oxycodone on 12/08/2018.  I went ahead and refilled this but explained that they need to know early in the day if they are going to run out of pain medicine and they need to check on Friday's before the workday and if they are going to run out of pain medicine in the weekend because our policy is not to refill pain medicines after hours or on weekends.

## 2018-12-15 NOTE — Progress Notes (Signed)
MD aware of elevated LFTs.  Ok to give 1 dose of percocet

## 2018-12-15 NOTE — Progress Notes (Signed)
OK to tx with elevated Bili and AST/ ALT per Dr. Irene Limbo

## 2018-12-15 NOTE — Patient Instructions (Signed)
[facility] Discharge Instructions for Patients Receiving Chemotherapy  Today you received the following chemotherapy agents Etoposide, Carboplatin   To help prevent nausea and vomiting after your treatment, we encourage you to take your nausea medication as prescribed   If you develop nausea and vomiting that is not controlled by your nausea medication, call the clinic.   BELOW ARE SYMPTOMS THAT SHOULD BE REPORTED IMMEDIATELY:  *FEVER GREATER THAN 100.5 F  *CHILLS WITH OR WITHOUT FEVER  NAUSEA AND VOMITING THAT IS NOT CONTROLLED WITH YOUR NAUSEA MEDICATION  *UNUSUAL SHORTNESS OF BREATH  *UNUSUAL BRUISING OR BLEEDING  TENDERNESS IN MOUTH AND THROAT WITH OR WITHOUT PRESENCE OF ULCERS  *URINARY PROBLEMS  *BOWEL PROBLEMS  UNUSUAL RASH Items with * indicate a potential emergency and should be followed up as soon as possible.  Feel free to call the clinic should you have any questions or concerns. The clinic phone number is [phone].  Please show the [facility] at check-in to the Emergency Department and triage nurse.  Etoposide, VP-16 injection What is this medicine? ETOPOSIDE, VP-16 (e toe POE side) is a chemotherapy drug. It is used to treat testicular cancer, lung cancer, and other cancers. This medicine may be used for other purposes; ask your health care provider or pharmacist if you have questions. COMMON BRAND NAME(S): Etopophos, Toposar, VePesid What should I tell my health care provider before I take this medicine? They need to know if you have any of these conditions: -infection -kidney disease -liver disease -low blood counts, like low white cell, platelet, or red cell counts -an unusual or allergic reaction to etoposide, other medicines, foods, dyes, or preservatives -pregnant or trying to get pregnant -breast-feeding How should I use this medicine? This medicine is for infusion into a vein. It is administered in a hospital or clinic  by a specially trained health care professional. Talk to your pediatrician regarding the use of this medicine in children. Special care may be needed. Overdosage: If you think you have taken too much of this medicine contact a poison control center or emergency room at once. NOTE: This medicine is only for you. Do not share this medicine with others. What if I miss a dose? It is important not to miss your dose. Call your doctor or health care professional if you are unable to keep an appointment. What may interact with this medicine? -aspirin -certain medications for seizures like carbamazepine, phenobarbital, phenytoin, valproic acid -cyclosporine -levamisole -warfarin This list may not describe all possible interactions. Give your health care provider a list of all the medicines, herbs, non-prescription drugs, or dietary supplements you use. Also tell them if you smoke, drink alcohol, or use illegal drugs. Some items may interact with your medicine. What should I watch for while using this medicine? Visit your doctor for checks on your progress. This drug may make you feel generally unwell. This is not uncommon, as chemotherapy can affect healthy cells as well as cancer cells. Report any side effects. Continue your course of treatment even though you feel ill unless your doctor tells you to stop. In some cases, you may be given additional medicines to help with side effects. Follow all directions for their use. Call your doctor or health care professional for advice if you get a fever, chills or sore throat, or other symptoms of a cold or flu. Do not treat yourself. This drug decreases your body's ability to fight infections. Try to avoid being around people who are  sick. This medicine may increase your risk to bruise or bleed. Call your doctor or health care professional if you notice any unusual bleeding. Talk to your doctor about your risk of cancer. You may be more at risk for certain types  of cancers if you take this medicine. Do not become pregnant while taking this medicine or for at least 6 months after stopping it. Women should inform their doctor if they wish to become pregnant or think they might be pregnant. Women of child-bearing potential will need to have a negative pregnancy test before starting this medicine. There is a potential for serious side effects to an unborn child. Talk to your health care professional or pharmacist for more information. Do not breast-feed an infant while taking this medicine. Men must use a latex condom during sexual contact with a woman while taking this medicine and for at least 4 months after stopping it. A latex condom is needed even if you have had a vasectomy. Contact your doctor right away if your partner becomes pregnant. Do not donate sperm while taking this medicine and for at least 4 months after you stop taking this medicine. Men should inform their doctors if they wish to father a child. This medicine may lower sperm counts. What side effects may I notice from receiving this medicine? Side effects that you should report to your doctor or health care professional as soon as possible: -allergic reactions like skin rash, itching or hives, swelling of the face, lips, or tongue -low blood counts - this medicine may decrease the number of white blood cells, red blood cells and platelets. You may be at increased risk for infections and bleeding. -signs of infection - fever or chills, cough, sore throat, pain or difficulty passing urine -signs of decreased platelets or bleeding - bruising, pinpoint red spots on the skin, black, tarry stools, blood in the urine -signs of decreased red blood cells - unusually weak or tired, fainting spells, lightheadedness -breathing problems -changes in vision -mouth or throat sores or ulcers -pain, redness, swelling or irritation at the injection site -pain, tingling, numbness in the hands or feet -redness,  blistering, peeling or loosening of the skin, including inside the mouth -seizures -vomiting Side effects that usually do not require medical attention (report to your doctor or health care professional if they continue or are bothersome): -diarrhea -hair loss -loss of appetite -nausea -stomach pain This list may not describe all possible side effects. Call your doctor for medical advice about side effects. You may report side effects to FDA at 1-800-FDA-1088. Where should I keep my medicine? This drug is given in a hospital or clinic and will not be stored at home. NOTE: This sheet is a summary. It may not cover all possible information. If you have questions about this medicine, talk to your doctor, pharmacist, or health care provider.  2019 Elsevier/Gold Standard (2015-09-02 11:53:23)  Carboplatin injection What is this medicine? CARBOPLATIN (KAR boe pla tin) is a chemotherapy drug. It targets fast dividing cells, like cancer cells, and causes these cells to die. This medicine is used to treat ovarian cancer and many other cancers. This medicine may be used for other purposes; ask your health care provider or pharmacist if you have questions. COMMON BRAND NAME(S): Paraplatin What should I tell my health care provider before I take this medicine? They need to know if you have any of these conditions: -blood disorders -hearing problems -kidney disease -recent or ongoing radiation therapy -an unusual or  allergic reaction to carboplatin, cisplatin, other chemotherapy, other medicines, foods, dyes, or preservatives -pregnant or trying to get pregnant -breast-feeding How should I use this medicine? This drug is usually given as an infusion into a vein. It is administered in a hospital or clinic by a specially trained health care professional. Talk to your pediatrician regarding the use of this medicine in children. Special care may be needed. Overdosage: If you think you have taken too  much of this medicine contact a poison control center or emergency room at once. NOTE: This medicine is only for you. Do not share this medicine with others. What if I miss a dose? It is important not to miss a dose. Call your doctor or health care professional if you are unable to keep an appointment. What may interact with this medicine? -medicines for seizures -medicines to increase blood counts like filgrastim, pegfilgrastim, sargramostim -some antibiotics like amikacin, gentamicin, neomycin, streptomycin, tobramycin -vaccines Talk to your doctor or health care professional before taking any of these medicines: -acetaminophen -aspirin -ibuprofen -ketoprofen -naproxen This list may not describe all possible interactions. Give your health care provider a list of all the medicines, herbs, non-prescription drugs, or dietary supplements you use. Also tell them if you smoke, drink alcohol, or use illegal drugs. Some items may interact with your medicine. What should I watch for while using this medicine? Your condition will be monitored carefully while you are receiving this medicine. You will need important blood work done while you are taking this medicine. This drug may make you feel generally unwell. This is not uncommon, as chemotherapy can affect healthy cells as well as cancer cells. Report any side effects. Continue your course of treatment even though you feel ill unless your doctor tells you to stop. In some cases, you may be given additional medicines to help with side effects. Follow all directions for their use. Call your doctor or health care professional for advice if you get a fever, chills or sore throat, or other symptoms of a cold or flu. Do not treat yourself. This drug decreases your body's ability to fight infections. Try to avoid being around people who are sick. This medicine may increase your risk to bruise or bleed. Call your doctor or health care professional if you  notice any unusual bleeding. Be careful brushing and flossing your teeth or using a toothpick because you may get an infection or bleed more easily. If you have any dental work done, tell your dentist you are receiving this medicine. Avoid taking products that contain aspirin, acetaminophen, ibuprofen, naproxen, or ketoprofen unless instructed by your doctor. These medicines may hide a fever. Do not become pregnant while taking this medicine. Women should inform their doctor if they wish to become pregnant or think they might be pregnant. There is a potential for serious side effects to an unborn child. Talk to your health care professional or pharmacist for more information. Do not breast-feed an infant while taking this medicine. What side effects may I notice from receiving this medicine? Side effects that you should report to your doctor or health care professional as soon as possible: -allergic reactions like skin rash, itching or hives, swelling of the face, lips, or tongue -signs of infection - fever or chills, cough, sore throat, pain or difficulty passing urine -signs of decreased platelets or bleeding - bruising, pinpoint red spots on the skin, black, tarry stools, nosebleeds -signs of decreased red blood cells - unusually weak or tired, fainting spells,  lightheadedness -breathing problems -changes in hearing -changes in vision -chest pain -high blood pressure -low blood counts - This drug may decrease the number of white blood cells, red blood cells and platelets. You may be at increased risk for infections and bleeding. -nausea and vomiting -pain, swelling, redness or irritation at the injection site -pain, tingling, numbness in the hands or feet -problems with balance, talking, walking -trouble passing urine or change in the amount of urine Side effects that usually do not require medical attention (report to your doctor or health care professional if they continue or are  bothersome): -hair loss -loss of appetite -metallic taste in the mouth or changes in taste This list may not describe all possible side effects. Call your doctor for medical advice about side effects. You may report side effects to FDA at 1-800-FDA-1088. Where should I keep my medicine? This drug is given in a hospital or clinic and will not be stored at home. NOTE: This sheet is a summary. It may not cover all possible information. If you have questions about this medicine, talk to your doctor, pharmacist, or health care provider.  2019 Elsevier/Gold Standard (2007-12-16 14:38:05)

## 2018-12-15 NOTE — Progress Notes (Signed)
START ON PATHWAY REGIMEN - Small Cell Lung     Cycles 1 through 4, every 21 days:     Atezolizumab      Carboplatin      Etoposide    Cycles 5 and beyond, every 21 days:     Atezolizumab   **Always confirm dose/schedule in your pharmacy ordering system**  Patient Characteristics: Newly Diagnosed, Preoperative or Nonsurgical Candidate (Clinical Staging), First Line, Extensive Stage Therapeutic Status: Newly Diagnosed, Preoperative or Nonsurgical Candidate (Clinical Staging) AJCC T Category: cTX AJCC N Category: cNX AJCC M Category: pM1c AJCC 8 Stage Grouping: IVB Stage Classification: Extensive Intent of Therapy: Non-Curative / Palliative Intent, Discussed with Patient

## 2018-12-15 NOTE — Telephone Encounter (Signed)
Per 3/23 schedule message add udenyca injection 3/24. Spoke with charge nurse and per charge nurse cancelled 3/24 and 3/25 infusion and added injection for 3/24. Patient informed in infusion.

## 2018-12-16 ENCOUNTER — Telehealth: Payer: Self-pay | Admitting: *Deleted

## 2018-12-16 ENCOUNTER — Telehealth: Payer: Self-pay | Admitting: Hematology

## 2018-12-16 ENCOUNTER — Inpatient Hospital Stay: Payer: Self-pay

## 2018-12-16 ENCOUNTER — Other Ambulatory Visit: Payer: Self-pay

## 2018-12-16 VITALS — BP 140/72

## 2018-12-16 DIAGNOSIS — C3490 Malignant neoplasm of unspecified part of unspecified bronchus or lung: Secondary | ICD-10-CM

## 2018-12-16 DIAGNOSIS — Z7189 Other specified counseling: Secondary | ICD-10-CM

## 2018-12-16 LAB — T4: T4, Total: 7.4 ug/dL (ref 4.5–12.0)

## 2018-12-16 MED ORDER — PEGFILGRASTIM-CBQV 6 MG/0.6ML ~~LOC~~ SOSY
6.0000 mg | PREFILLED_SYRINGE | Freq: Once | SUBCUTANEOUS | Status: AC
  Administered 2018-12-16: 6 mg via SUBCUTANEOUS

## 2018-12-16 MED ORDER — PEGFILGRASTIM-CBQV 6 MG/0.6ML ~~LOC~~ SOSY
PREFILLED_SYRINGE | SUBCUTANEOUS | Status: AC
  Filled 2018-12-16: qty 0.6

## 2018-12-16 NOTE — Telephone Encounter (Signed)
-----   Message from Ishmael Holter, RN sent at 12/15/2018  2:16 PM EDT ----- Regarding: Dr. Irene Limbo 1st tx f/u call 1st tx f/u call - Dr. Irene Limbo

## 2018-12-16 NOTE — Telephone Encounter (Signed)
Called [facility] for chemotherapy F/U.  Patient is doing well.  Denies n/v.  Denies any new side effects or symptoms.  Bowel and bladder functioning well.  "Still not eating much but drinking well.  Pain is why was not eating.  Pain is controlled now so we'll see about eating.  Instructed to drink 64 oz minimum daily or at least the day before, of and after treatment.   Asked to schedule MRI.  Provided [facility] number to call.  Denies further questions or needs at this time.  Encouraged to call [phone] Mon -Fri 8:00 am - 4:30 pm or anytime as needed for symptoms, changes or event outside office hours.

## 2018-12-16 NOTE — Patient Instructions (Signed)
Pegfilgrastim injection  What is this medicine?  PEGFILGRASTIM (PEG fil gra stim) is a long-acting granulocyte colony-stimulating factor that stimulates the growth of neutrophils, a type of white blood cell important in the body's fight against infection. It is used to reduce the incidence of fever and infection in patients with certain types of cancer who are receiving chemotherapy that affects the bone marrow, and to increase survival after being exposed to high doses of radiation.  This medicine may be used for other purposes; ask your health care provider or pharmacist if you have questions.  COMMON BRAND NAME(S): Fulphila, Neulasta, UDENYCA  What should I tell my health care provider before I take this medicine?  They need to know if you have any of these conditions:  -kidney disease  -latex allergy  -ongoing radiation therapy  -sickle cell disease  -skin reactions to acrylic adhesives (On-Body Injector only)  -an unusual or allergic reaction to pegfilgrastim, filgrastim, other medicines, foods, dyes, or preservatives  -pregnant or trying to get pregnant  -breast-feeding  How should I use this medicine?  This medicine is for injection under the skin. If you get this medicine at home, you will be taught how to prepare and give the pre-filled syringe or how to use the On-body Injector. Refer to the patient Instructions for Use for detailed instructions. Use exactly as directed. Tell your healthcare provider immediately if you suspect that the On-body Injector may not have performed as intended or if you suspect the use of the On-body Injector resulted in a missed or partial dose.  It is important that you put your used needles and syringes in a special sharps container. Do not put them in a trash can. If you do not have a sharps container, call your pharmacist or healthcare provider to get one.  Talk to your pediatrician regarding the use of this medicine in children. While this drug may be prescribed for  selected conditions, precautions do apply.  Overdosage: If you think you have taken too much of this medicine contact a poison control center or emergency room at once.  NOTE: This medicine is only for you. Do not share this medicine with others.  What if I miss a dose?  It is important not to miss your dose. Call your doctor or health care professional if you miss your dose. If you miss a dose due to an On-body Injector failure or leakage, a new dose should be administered as soon as possible using a single prefilled syringe for manual use.  What may interact with this medicine?  Interactions have not been studied.  Give your health care provider a list of all the medicines, herbs, non-prescription drugs, or dietary supplements you use. Also tell them if you smoke, drink alcohol, or use illegal drugs. Some items may interact with your medicine.  This list may not describe all possible interactions. Give your health care provider a list of all the medicines, herbs, non-prescription drugs, or dietary supplements you use. Also tell them if you smoke, drink alcohol, or use illegal drugs. Some items may interact with your medicine.  What should I watch for while using this medicine?  You may need blood work done while you are taking this medicine.  If you are going to need a MRI, CT scan, or other procedure, tell your doctor that you are using this medicine (On-Body Injector only).  What side effects may I notice from receiving this medicine?  Side effects that you should report to   your doctor or health care professional as soon as possible:  -allergic reactions like skin rash, itching or hives, swelling of the face, lips, or tongue  -back pain  -dizziness  -fever  -pain, redness, or irritation at site where injected  -pinpoint red spots on the skin  -red or dark-brown urine  -shortness of breath or breathing problems  -stomach or side pain, or pain at the shoulder  -swelling  -tiredness  -trouble passing urine or  change in the amount of urine  Side effects that usually do not require medical attention (report to your doctor or health care professional if they continue or are bothersome):  -bone pain  -muscle pain  This list may not describe all possible side effects. Call your doctor for medical advice about side effects. You may report side effects to FDA at 1-800-FDA-1088.  Where should I keep my medicine?  Keep out of the reach of children.  If you are using this medicine at home, you will be instructed on how to store it. Throw away any unused medicine after the expiration date on the label.  NOTE: This sheet is a summary. It may not cover all possible information. If you have questions about this medicine, talk to your doctor, pharmacist, or health care provider.   2019 Elsevier/Gold Standard (2017-12-16 16:57:08)

## 2018-12-16 NOTE — Telephone Encounter (Signed)
No los per 3/23.

## 2018-12-17 ENCOUNTER — Telehealth: Payer: Self-pay | Admitting: *Deleted

## 2018-12-17 ENCOUNTER — Ambulatory Visit: Payer: Self-pay

## 2018-12-17 NOTE — Telephone Encounter (Signed)
Spoke with patient following chemo at [facility] 3/24. Patient states pain better since on call MD changed pain med evening 12/15/2018. Informed patient that Dr. Irene Limbo was going to refer him to Palliative care as requested for symptom/pain management. Patient verbalized understanding. Informed patient that Dr. Irene Limbo was cancelling port placement scheduled for this Friday 3/27 - wants to discuss options with patient at appt on Monday 3/30. Patient verbalized understanding.

## 2018-12-17 NOTE — Progress Notes (Signed)
HEMATOLOGY/ONCOLOGY CLINIC NOTE  Date of Service: 12/17/2018  Patient Care Team: Patient, No Pcp Per as PCP - General (General Practice)  CHIEF COMPLAINTS/PURPOSE OF CONSULTATION:  Recently Diagnosed Small Cell Lung Cancer  HISTORY OF PRESENTING ILLNESS:  Cody Montoya a 63 y.o.malewith medical history significant ofalcohol abuse.He does not have a primary care provider and does not seek routine medical care. Around 3 weeks ago, the patient started having epigastric abdominal pain. It would wax and wane in severity but progressively worsenedsince onset. He has had associated nausea and vomiting. His appetite has decreased and he is lost around 10 pounds. He stopped drinking when this happened. He would drink around 7-8 shots of vodka daily in addition to the 2-3 beers. He has done this for the past 4-5 years. He does not have any known history of hepatitis B or C and denies taking any medication/supplements routinely. He denies any prominent vessels, yellowing of the skin/eyes, fevers, shortness of breath, or bowel changes. The patient has been using ibuprofen for pain with little relief.  In the emergency room, a CT scan showsmultifocal hepatocellular carcinoma that is not appear to be metastatic to the liver. There is also thoracic adenopathy,omental/peritoneal involvement and left pleural effusion suggestive of metastatic disease. The oncology team was called and recommended admission for work-up.   When seen today, the patient reports that he continues to have abdominal discomfort in his upper abdomen.  Along with the discomfort in his abdomen, he also reports nausea and vomiting and increased constipation.  This is been present for about 2 to 3 weeks.  He states his appetite has been decreased and he has lost weight.  He estimates that he has lost about 10 pounds over the past few months.  Oncology was asked to see the patient to make recommendations  regarding his liver masses.  Interval History:   Patient is here for follow-up of his extensive stage small cell lung cancer.  His repeat labs recently showed significant increase in his liver function tests especially his total bilirubin which is up to 23.8 and alkaline phosphatase which is elevated now to 881 concerning for some obstructive findings likely from his malignancy.  Patient denies alcohol use recently. In the setting of worsening liver function we discussed his goals of care and the increased risk of being able to tolerate chemotherapy and the need for significant dose reductions in his chemotherapy.  Patient after careful discussion would like to proceed with treatment as opposed to moving to hospice at this time.  He is agreeable to getting a repeat ultrasound of the liver to look for extrahepatic obstruction that could potentially be stentable. His potassium was 5.6 on Friday and has now resolved to 4.4. No overt confusion. Notes pain is reasonably controlled with his current pain medications.  On review of systems, pt reports back pain, weak appetite, stable energy levels, constipation, and denies headaches, changes in vision, changes in speech, and any other symptoms.   MEDICAL HISTORY:  Past Medical History:  Diagnosis Date   Cancer [facility])     SURGICAL HISTORY: Past Surgical History:  Procedure Laterality Date   NO PAST SURGERIES      SOCIAL HISTORY: Social History   Socioeconomic History   Marital status: Single    Spouse name: Not on file   Number of children: Not on file   Years of education: Not on file   Highest education level: Not on file  Occupational History   Occupation: Academic librarian  production  Social Designer, fashion/clothing strain: Not very hard   Food insecurity:    Worry: Never true    Inability: Never true   Transportation needs:    Medical: No    Non-medical: No  Tobacco Use   Smoking status: Current Every Day Smoker     Packs/day: 1.00    Years: 45.00    Pack years: 45.00    Types: Cigarettes  Substance and Sexual Activity   Alcohol use: Yes    Alcohol/week: 8.0 standard drinks    Types: 3 Cans of beer, 5 Shots of liquor per week    Comment: no alcohol in past month due to symptoms   Drug use: Not Currently    Frequency: 7.0 times per week    Types: Marijuana   Sexual activity: Not Currently  Lifestyle   Physical activity:    Days per week: 5 days    Minutes per session: 150+ min   Stress: To some extent  Relationships   Social connections:    Talks on phone: More than three times a week    Gets together: More than three times a week    Attends religious service: Never    Active member of club or organization: No    Attends meetings of clubs or organizations: Never    Relationship status: Living with partner   Intimate partner violence:    Fear of current or ex partner: No    Emotionally abused: No    Physically abused: No    Forced sexual activity: No  Other Topics Concern   Not on file  Social History Narrative   Not on file    FAMILY HISTORY: No family history on file.  ALLERGIES:  has No Known Allergies.  MEDICATIONS:  Current Outpatient Medications  Medication Sig Dispense Refill   docusate sodium (COLACE) 100 MG capsule Take 1 capsule (100 mg total) by mouth 2 (two) times daily. 10 capsule 0   ibuprofen (ADVIL,MOTRIN) 200 MG tablet Take 800 mg by mouth every 6 (six) hours as needed for fever or moderate pain.     ondansetron (ZOFRAN) 8 MG tablet Take 1 tablet (8 mg total) by mouth 2 (two) times daily as needed for refractory nausea / vomiting. Start on day 3 after carboplatin chemo. 30 tablet 1   oxyCODONE (OXY IR/ROXICODONE) 5 MG immediate release tablet Take 1 tablet (5 mg total) by mouth every 4 (four) hours as needed for up to 7 days for moderate pain. 30 tablet 0   oxyCODONE (OXYCONTIN) 10 mg 12 hr tablet Take 1 tablet (10 mg total) by mouth every 12 (twelve)  hours for 30 days. For cancer pain 60 tablet 0   polyethylene glycol (MIRALAX / GLYCOLAX) packet Take 17 g by mouth daily as needed for mild constipation. 14 each 0   No current facility-administered medications for this visit.    Facility-Administered Medications Ordered in Other Visits  Medication Dose Route Frequency Provider Last Rate Last Dose   oxyCODONE (Oxy IR/ROXICODONE) immediate release tablet 5 mg  5 mg Oral Once Brunetta Genera, MD        REVIEW OF SYSTEMS:    10 Point review of Systems was done is negative except as noted above.  PHYSICAL EXAMINATION: ECOG PERFORMANCE STATUS: 1-2 Vital signs reviewed in epic  GENERAL:alert, in no acute distress and comfortable SKIN: no acute rashes, jaundiced appearing skin. EYES: conjunctiva are pink and non-injected, sclera icteric++ OROPHARYNX: MMM, no exudates, no  oropharyngeal erythema or ulceration NECK: supple, no JVD LYMPH:  no palpable lymphadenopathy in the cervical, axillary or inguinal regions LUNGS: clear to auscultation b/l with normal respiratory effort HEART: regular rate & rhythm ABDOMEN:  normoactive bowel sounds , non tender, not distended. Extremity: Trace pedal edema PSYCH: alert & oriented x 3 with fluent speech NEURO: no focal motor/sensory deficits  LABORATORY DATA:  I have reviewed the data as listed  . CBC Latest Ref Rng & Units 12/15/2018 12/12/2018 11/29/2018  WBC 4.0 - 10.5 K/uL 9.8 9.6 6.3  Hemoglobin 13.0 - 17.0 g/dL 14.4 14.6 14.6  Hematocrit 39.0 - 52.0 % 41.3 41.6 44.1  Platelets 150 - 400 K/uL 245 264 191    . CMP Latest Ref Rng & Units 12/15/2018 12/12/2018 12/02/2018  Glucose 70 - 99 mg/dL 104(H) 87 78  BUN 8 - 23 mg/dL 26(H) 30(H) 9  Creatinine 0.61 - 1.24 mg/dL 0.98 1.13 0.89  Sodium 135 - 145 mmol/L 132(L) 133(L) 136  Potassium 3.5 - 5.1 mmol/L 4.4 5.6(H) 3.9  Chloride 98 - 111 mmol/L 95(L) 96(L) 102  CO2 22 - 32 mmol/L 22 23 25   Calcium 8.9 - 10.3 mg/dL 9.3 9.4 8.6(L)  Total  Protein 6.5 - 8.1 g/dL 7.0 7.3 6.0(L)  Total Bilirubin 0.3 - 1.2 mg/dL 23.8(HH) 18.6(HH) 2.3(H)  Alkaline Phos 38 - 126 U/L 881(H) 815(H) 272(H)  AST 15 - 41 U/L 206(HH) 216(HH) 101(H)  ALT 0 - 44 U/L 194(H) 193(H) 103(H)       RADIOGRAPHIC STUDIES: I have personally reviewed the radiological images as listed and agreed with the findings in the report. Ct Chest W Contrast  Result Date: 12/01/2018 CLINICAL DATA:  Staging for new liver neoplasm found on CT EXAM: CT CHEST WITH CONTRAST TECHNIQUE: Multidetector CT imaging of the chest was performed during intravenous contrast administration. CONTRAST:  57mL OMNIPAQUE IOHEXOL 300 MG/ML  SOLN COMPARISON:  None. FINDINGS: Cardiovascular: The heart size appears within normal limits. No pericardial effusion identified. Mild aortic atherosclerosis. Three vessel coronary artery atherosclerotic calcifications identified. Mediastinum/Nodes: Left supraclavicular lymph node is prominent measuring 1 cm, image 5/3. Left sided pre-vascular lymph node within the superior mediastinum measures 1.6 cm, image 33/3. Right paratracheal lymph node measures 1.8 cm, image 57/3. No subcarinal or right hilar adenopathy. Low left paratracheal lymph node measures 2.2 cm, image 61/3. Lungs/Pleura: Small left pleural effusion. Within the medial left upper lobe there is a paramediastinal mass measuring 6.6 x 6.2 by 10.0 cm. Tumor invades the mediastinum and left hilum. Encasement and complete occlusion of the left upper lobe bronchus identified. There is also encasement of the left pulmonary artery. Complete occlusion of left upper pulmonary vein identified. Subpleural nodule within the lateral left upper lobe measures 5 mm, multiple small pleural nodules are identified overlying the left lung compatible with pleural metastasis, for example image 101/7. Upper Abdomen: Multiple ill-defined low-density lesions involving the liver are identified within a background of cirrhosis or pseudo  cirrhosis due to liver metastasis. See report from 11/29/2018 for details. Again seen is ascites, bilateral adrenal gland enlargement and abdominal adenopathy. Musculoskeletal: Chronic bilateral posttraumatic rib deformities are noted. There is a suspicious lytic lesion involving the approximate T11 vertebra with extension into the left posterior aspect of the canal, image 90/6. Here there is a epidural soft tissue component measuring 1.1 cm, image 90/6., image 118/3. IMPRESSION: 1. Large left upper lobe lung mass is identified with invasion of the mediastinum and left hilum. Associated left-sided pleural metastasis with pleural  nodularity and effusion noted. Ipsilateral hilar and bilateral mediastinal nodal metastasis. Left supraclavicular metastasis also noted. 2. Lytic lesion involving the posterior aspect of the approximate T11 vertebra with suspected epidural tumor involvement noted. Advise further evaluation with contrast enhanced MRI of the thoracic spine. 3. Upper abdominal metastasis as described on 11/29/2018. 4. These results will be called to the ordering clinician or representative by the Radiologist Assistant, and communication documented in the PACS or zVision Dashboard. Electronically Signed   By: Kerby Moors M.D.   On: 12/01/2018 14:01   Mr Thoracic Spine Wo Contrast  Result Date: 12/01/2018 CLINICAL DATA:  Metastatic disease of thoracic spine. EXAM: MRI THORACIC SPINE WITHOUT CONTRAST TECHNIQUE: Multiplanar, multisequence MR imaging of the thoracic spine was performed. No intravenous contrast was administered. COMPARISON:  Chest CT 12/01/2018 FINDINGS: The examination had to be discontinued prior to completion due to patient pain following liver biopsy. Only sagittal sequences were obtained. Alignment:  Normal Vertebrae: There are numerous marrow lesions of the visualized cervical spine and thoracic spine. In the incompletely visualized cervical spine, there are lesions at C6 and C7. The C7  lesion extends into the left pedicle and posterior elements. In the thoracic spine there are lesions at T2 and at all levels from T5-T12. There is also a lesion at L1. Most of the lesions are confined to the vertebral body. The lesion at T10 extends into the left pedicle and lamina. Smaller lesions at T8 and T9 also extend into the left pedicles. There is a ventral epidural component of the T10 tumor. Cord:  Normal signal and caliber. Paraspinal and other soft tissues: Thoracic soft tissues are better assessed on the concomitant chest CT. Disc levels: Limited assessment due to absence of axial imaging. No spinal canal stenosis. IMPRESSION: 1. 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 2. The largest lesion is located at T10, where there is also a component of ventral epidural tumor. 3. No spinal canal stenosis. Electronically Signed   By: Ulyses Jarred M.D.   On: 12/01/2018 19:58   Ct Abdomen Pelvis W Contrast  Result Date: 11/29/2018 CLINICAL DATA:  Upper abdominal pain for several weeks. Smoker. No history of primary malignancy per the medical record. EXAM: CT ABDOMEN AND PELVIS WITH CONTRAST TECHNIQUE: Multidetector CT imaging of the abdomen and pelvis was performed using the standard protocol following bolus administration of intravenous contrast. CONTRAST:  144mL OMNIPAQUE IOHEXOL 300 MG/ML  SOLN COMPARISON:  None. FINDINGS: Lower chest: Clear lung bases. Tiny left pleural effusion with pleural irregularity, most apparent on image 3/4. Normal heart size with right coronary artery atherosclerosis. Hepatobiliary: Hepatomegaly at 21.6 cm craniocaudal. Irregular hepatic capsule with caudate and lateral segment left liver lobe enlargement. Heterogeneous density throughout the liver, with more well-defined hypoattenuating lesions including in the high left hepatic lobe at 2.1 cm on image 15/3 and in the central right hepatic lobe at 1.8 cm on the same image. The  gallbladder is contracted and appears thick walled. No pericholecystic edema. No biliary duct dilatation. Pancreas: Normal, without mass or ductal dilatation. Spleen: Normal in size, without focal abnormality. Adrenals/Urinary Tract: Left greater than right adrenal thickening. Too small to characterize lesions in both kidneys. No hydronephrosis. Normal urinary bladder. Stomach/Bowel: Normal stomach, without wall thickening. Scattered colonic diverticula. Normal terminal ileum and appendix. Normal small bowel. Vascular/Lymphatic: Aortic and branch vessel atherosclerosis. Patent portal and splenic veins. No specific evidence of portal venous hypertension. Extensive abdominal adenopathy. Index aortocaval node of 2.2 cm on  image 36/3. Porta hepatis adenopathy at 2.4 cm on image 28/3. Retrocrural adenopathy at 1.1 cm on image 20/3. No pelvic sidewall adenopathy. Reproductive: Normal prostate. Other: Small volume abdominopelvic ascites. Perihepatic nodularity may represent small lymph nodes or omental metastasis including at 9 mm on image 43/3. Right inguinal hernia contains fat. Musculoskeletal: Anterior left seventh rib nonacute fracture. Lower thoracic spondylosis. IMPRESSION: 1. 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 Widespread metastatic disease from an otherwise unknown primary is possible but felt less likely. 2. Upper abdominal and lower thoracic adenopathy, most consistent with metastatic disease. 3. Small left pleural effusion with findings suspicious for pleural metastasis. 4. Probable perihepatic omental/peritoneal metastasis. 5. Coronary artery atherosclerosis. Aortic Atherosclerosis (ICD10-I70.0). 6. Small volume abdominopelvic ascites. Electronically Signed   By: Abigail Miyamoto M.D.   On: 11/29/2018 14:34   US Biopsy (liver)  Result Date: 12/01/2018 INDICATION: 63 year old male with a history of multiple liver lesions unknown primary cancer EXAM:  ULTRASOUND-GUIDED BIOPSY LIVER MASS MEDICATIONS: None. ANESTHESIA/SEDATION: Moderate (conscious) sedation was employed during this procedure. A total of Versed 1.5 mg and Fentanyl 75 mcg was administered intravenously. Moderate Sedation Time: 10 minutes. The patient's level of consciousness and vital signs were monitored continuously by radiology nursing throughout the procedure under my direct supervision. FLUOROSCOPY TIME:  None COMPLICATIONS: None PROCEDURE: Informed written consent was obtained from the patient after a thorough discussion of the procedural risks, benefits and alternatives. All questions were addressed. Maximal Sterile Barrier Technique was utilized including caps, mask, sterile gowns, sterile gloves, sterile drape, hand hygiene and skin antiseptic. A timeout was performed prior to the initiation of the procedure. Patient positioned supine position on the ultrasound table. Ultrasound survey of the liver performed with images stored sent to PACs. Patient is prepped and draped in the usual sterile fashion. 1% lidocaine was used for local anesthesia. Using ultrasound guidance, 17 gauge guide needle was advanced into heterogeneously hypoechoic mass of the right liver. Once we confirmed position of the needle tip, multiple 18 gauge core biopsy were achieved. Two Gel-Foam pledgets were infused with a small amount of saline. Needle was removed and a final image was stored. Patient tolerated the procedure well and remained hemodynamically stable throughout. No complications were encountered and no significant blood loss. IMPRESSION: Status post ultrasound-guided biopsy of right liver lesion. Tissue specimen sent to pathology for complete histopathologic analysis. Signed, Dulcy Fanny. Dellia Nims, RPVI [facility] Radiology Electronically Signed   By: Corrie Mckusick D.O.   On: 12/01/2018 17:24    ASSESSMENT & PLAN:  63 y.o. male with  1. Newly diagnosed  Extensive Stage Small Cell Carcinoma  2.  Worsening liver function tests.  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  Bump in bilirubin could be from progression of his liver mets.  Cannot rule out an obstructive component that has arisen in the interim that is worsening his bilirubin and alkaline phosphatase levels.  PLAN: -Discussed patient's most recent labs from 3/20 and 3/23 and discussed significant worsening of liver functions. -Rediscussed his goals of care in the setting of worsening liver functions and to determine if he would like to proceed with high risk palliative chemotherapy with significant dose reductions versus transitioning to hospice cares. -Patient chooses to proceed with dose reduced carboplatin and etoposide chemotherapy.  To receive carboplatin AUC 3 and etoposide 80 mg/m only day 1 to determine tolerability of treatment initially. -We will hold off on  day 2 and day 3 of etoposide treatment this cycle. -We will hold off on atezolizumab with cycle 1. -After confirming with patient a palliative care referral has been requested given the patient might have worsening symptom burden if he does not tolerate chemotherapy. -Ultrasound of the abdomen to reevaluate his liver in the setting of worsening liver functions to determine if there is any evidence of extrahepatic obstruction that could be addressed by palliative stenting of his CBD. -Recommend eating soft foods -Awaiting MRI Brain to complete staging and complete characterization  [place] abd ASAP MRI brain that pending RTC with Dr Irene Limbo with labs in 7-10 days for toxicity check   All of the patients questions were answered with apparent satisfaction. The patient knows to call the clinic with any problems, questions or concerns.  The total time spent in the appt was 25 minutes and more than 50% was on counseling and direct patient cares.    Cody Lone MD [place] AAHIVMS [facility]  [facility] Hematology/Oncology Physician [facility]  (Office):       [phone] (Work cell):  [phone] (Fax):           [phone]

## 2018-12-18 ENCOUNTER — Ambulatory Visit
Admission: RE | Admit: 2018-12-18 | Discharge: 2018-12-18 | Disposition: A | Discharge status: Home / Self Care | Payer: Self-pay | Source: Ambulatory Visit | Attending: Hematology | Admitting: Hematology

## 2018-12-18 ENCOUNTER — Other Ambulatory Visit: Payer: Self-pay | Admitting: Hematology

## 2018-12-18 ENCOUNTER — Other Ambulatory Visit: Payer: Self-pay

## 2018-12-18 DIAGNOSIS — C3490 Malignant neoplasm of unspecified part of unspecified bronchus or lung: Secondary | ICD-10-CM

## 2018-12-18 DIAGNOSIS — C787 Secondary malignant neoplasm of liver and intrahepatic bile duct: Secondary | ICD-10-CM

## 2018-12-18 MED ORDER — GADOBENATE DIMEGLUMINE 529 MG/ML IV SOLN: 18.0000 mL | Freq: Once | INTRAVENOUS | Status: DC | PRN

## 2018-12-18 MED ORDER — OXYCODONE HCL ER 10 MG PO T12A: 10.0000 mg | EXTENDED_RELEASE_TABLET | Freq: Two times a day (BID) | ORAL | 0 refills | Status: AC

## 2018-12-18 MED ORDER — OXYCODONE HCL 5 MG PO TABS: 5.0000 mg | ORAL_TABLET | ORAL | 0 refills | Status: DC | PRN

## 2018-12-18 MED ORDER — OXYCODONE HCL ER 10 MG PO T12A: 10.0000 mg | EXTENDED_RELEASE_TABLET | Freq: Two times a day (BID) | ORAL | 0 refills | Status: DC

## 2018-12-19 ENCOUNTER — Ambulatory Visit (HOSPITAL_COMMUNITY): Payer: Self-pay

## 2018-12-19 ENCOUNTER — Telehealth: Payer: Self-pay | Admitting: *Deleted

## 2018-12-19 ENCOUNTER — Other Ambulatory Visit (HOSPITAL_COMMUNITY): Payer: Self-pay

## 2018-12-19 ENCOUNTER — Telehealth: Payer: Self-pay

## 2018-12-19 NOTE — Telephone Encounter (Signed)
Patient called, states he needs both pain medications filled as he is almost out. Pain meds refilled by Dr. Irene Limbo. Pharmacy informed patient not able to release early. Per Dr. Irene Limbo, ok for pharmacy to release both pain meds early. Contacted [facility] on [address] [phone]. Spoke with Bryson Ha and gave Dr. Grier Mitts directions. Per Bryson Ha, oxycodone 5 mg IR can be released early on MD directions. OxyContin cannot be released at this time, earliest release for it is 4/7.  Contacted patient - patient has been taking OxyContin 10 mg 2 tablets every 12 hours based on directions received from after hours on call MD on 3/23. Patient states he has about 20 of the OxyContin 10 mg pills left, he thinks. Advised patient that oxycodone IR 5 mg can be filled by pharmacy at this time. OxyContin 10 mg cannot be filled early. Informed patient he may want to resume taking only one 10 mg tablet every 12 hours and use IR 5mg  for breakthrough pain in between as directed. Patient states he thinks that will work ok for now and will take medicine this way and discuss pain management with Dr. Irene Limbo at appt on Monday 3/30.

## 2018-12-19 NOTE — Progress Notes (Signed)
HEMATOLOGY/ONCOLOGY CLINIC NOTE  Date of Service: 12/22/2018  Patient Care Team: Patient, No Pcp Per as PCP - General (General Practice)  CHIEF COMPLAINTS/PURPOSE OF CONSULTATION:  Recently Diagnosed Small Cell Lung Cancer  HISTORY OF PRESENTING ILLNESS:  Cody Montoya a 63 y.o.malewith medical history significant ofalcohol abuse.He does not have a primary care provider and does not seek routine medical care. Around 3 weeks ago, the patient started having epigastric abdominal pain. It would wax and wane in severity but progressively worsenedsince onset. He has had associated nausea and vomiting. His appetite has decreased and he is lost around 10 pounds. He stopped drinking when this happened. He would drink around 7-8 shots of vodka daily in addition to the 2-3 beers. He has done this for the past 4-5 years. He does not have any known history of hepatitis B or C and denies taking any medication/supplements routinely. He denies any prominent vessels, yellowing of the skin/eyes, fevers, shortness of breath, or bowel changes. The patient has been using ibuprofen for pain with little relief.  In the emergency room, a CT scan showsmultifocal hepatocellular carcinoma that is not appear to be metastatic to the liver. There is also thoracic adenopathy,omental/peritoneal involvement and left pleural effusion suggestive of metastatic disease. The oncology team was called and recommended admission for work-up.   When seen today, the patient reports that he continues to have abdominal discomfort in his upper abdomen.  Along with the discomfort in his abdomen, he also reports nausea and vomiting and increased constipation.  This is been present for about 2 to 3 weeks.  He states his appetite has been decreased and he has lost weight.  He estimates that he has lost about 10 pounds over the past few months.  Oncology was asked to see the patient to make recommendations  regarding his liver masses.  Interval History:   Cody Montoya is here for follow-up of his extensive stage small cell lung cancer.  The patient's last visit with [place] was on 12/15/18.. The pt reports that he is doing well overall.   The pt notes that he tolerated his first infusion well overall. The pt reports that his appetite has increased after his first treatment. The pt notes that he can tell that his abdominal involvement has decreased. The pt notes that he has been taking 31m Oxycodone every 4 hours. He notes that his abdominal pain has been much more controlled. The pt also notes that his back pain is better controlled, but he has some difficulty lying down on his back for long periods of time.   The pt notes that he "feels pretty good." He lost 15 pounds in the last 11 days, but again notes that he is now eating better. He denies any previous leg swelling. He is able to walk around and denies any overt leg weakness. The pt also notes that his urine color has become less brown and yellow, and is even clear now.   The pt notes that he was not able to tolerate the MRI Brain in the interim as lying on his back is painful, and notes he "had to move."   The pt notes that he has not been consuming alcohol, though he "tried a beer for the taste, and the taste was bad."  Of note since the patient's last visit, pt has had a [place] completed on 12/18/18 with results revealing Widespread metastatic disease throughout the liver. The liver has a somewhat nodular contour suggesting  underlying cirrhosis. 2. No gallstones seen. Gallbladder wall appears mildly thickened and edematous. Sludge is noted in the gallbladder. There is also prominence of the common bile duct without mass or calculus evident. These findings raise concern for potential degree of acalculus cholecystitis. In this regard, it may be prudent to consider nuclear medicine hepatobiliary imaging study to assess for cystic duct  patency.  Lab results today (12/22/18) of CBC w/diff and CMP is as follows: all values are WNL except for WBC at 17.1k, RBC at 3.87, HGB at 12.3, HCT at 36.9, RDW at 16.0, PLT at 145k, Glucose at 120, Albumin at 2.5, AST at 113, ALT at 121, Alk Phos at 961, Total Bilirubin at 9.4. 12/22/18 CMP is pending  On review of systems, pt reports clear urine, stable energy levels, controlled abdominal pain, back pain when lying down, improved appetite, weight loss, and denies acute abdominal pains, leg swelling, and any other symptoms.  MEDICAL HISTORY:  Past Medical History:  Diagnosis Date   Cancer [facility])     SURGICAL HISTORY: Past Surgical History:  Procedure Laterality Date   NO PAST SURGERIES      SOCIAL HISTORY: Social History   Socioeconomic History   Marital status: Single    Spouse name: Not on file   Number of children: Not on file   Years of education: Not on file   Highest education level: Not on file  Occupational History   Occupation: Surveyor, quantity  Social Needs   Financial resource strain: Not very hard   Food insecurity:    Worry: Never true    Inability: Never true   Transportation needs:    Medical: No    Non-medical: No  Tobacco Use   Smoking status: Current Every Day Smoker    Packs/day: 1.00    Years: 45.00    Pack years: 45.00    Types: Cigarettes  Substance and Sexual Activity   Alcohol use: Yes    Alcohol/week: 8.0 standard drinks    Types: 3 Cans of beer, 5 Shots of liquor per week    Comment: no alcohol in past month due to symptoms   Drug use: Not Currently    Frequency: 7.0 times per week    Types: Marijuana   Sexual activity: Not Currently  Lifestyle   Physical activity:    Days per week: 5 days    Minutes per session: 150+ min   Stress: To some extent  Relationships   Social connections:    Talks on phone: More than three times a week    Gets together: More than three times a week    Attends religious service:  Never    Active member of club or organization: No    Attends meetings of clubs or organizations: Never    Relationship status: Living with partner   Intimate partner violence:    Fear of current or ex partner: No    Emotionally abused: No    Physically abused: No    Forced sexual activity: No  Other Topics Concern   Not on file  Social History Narrative   Not on file    FAMILY HISTORY: No family history on file.  ALLERGIES:  has No Known Allergies.  MEDICATIONS:  Current Outpatient Medications  Medication Sig Dispense Refill   docusate sodium (COLACE) 100 MG capsule Take 1 capsule (100 mg total) by mouth 2 (two) times daily. 10 capsule 0   ibuprofen (ADVIL,MOTRIN) 200 MG tablet Take 800 mg by mouth every  6 (six) hours as needed for fever or moderate pain.     ondansetron (ZOFRAN) 8 MG tablet Take 1 tablet (8 mg total) by mouth 2 (two) times daily as needed for refractory nausea / vomiting. Start on day 3 after carboplatin chemo. 30 tablet 1   oxyCODONE (OXY IR/ROXICODONE) 5 MG immediate release tablet Take 1-2 tablets (5-10 mg total) by mouth every 4 (four) hours as needed for up to 7 days for moderate pain. 60 tablet 0   oxyCODONE (OXYCONTIN) 10 mg 12 hr tablet Take 1 tablet (10 mg total) by mouth every 12 (twelve) hours for 30 days. For cancer pain 60 tablet 0   polyethylene glycol (MIRALAX / GLYCOLAX) packet Take 17 g by mouth daily as needed for mild constipation. 14 each 0   No current facility-administered medications for this visit.    Facility-Administered Medications Ordered in Other Visits  Medication Dose Route Frequency Provider Last Rate Last Dose   oxyCODONE (Oxy IR/ROXICODONE) immediate release tablet 5 mg  5 mg Oral Once Brunetta Genera, MD        REVIEW OF SYSTEMS:    A 10+ POINT REVIEW OF SYSTEMS WAS OBTAINED including neurology, dermatology, psychiatry, cardiac, respiratory, lymph, extremities, GI, GU, Musculoskeletal, constitutional, breasts,  reproductive, HEENT.  All pertinent positives are noted in the HPI.  All others are negative.   PHYSICAL EXAMINATION: ECOG PERFORMANCE STATUS: 1-2 Vital signs reviewed in epic  GENERAL:alert, in no acute distress and comfortable SKIN: no acute rashes, jaundiced appearing skin EYES: conjunctiva are pink and non-injected, sclera icteric ++ OROPHARYNX: MMM, no exudates, no oropharyngeal erythema or ulceration NECK: supple, no JVD LYMPH:  no palpable lymphadenopathy in the cervical, axillary or inguinal regions LUNGS: clear to auscultation b/l with normal respiratory effort HEART: regular rate & rhythm ABDOMEN:  Palpable hepatomegaly about 4 fingerbreadths below costal line, no TTP, and negative Murph's sign .  Extremity: trace pedal edema PSYCH: alert & oriented x 3 with fluent speech NEURO: no focal motor/sensory deficits   LABORATORY DATA:  I have reviewed the data as listed  . CBC Latest Ref Rng & Units 12/22/2018 12/15/2018 12/12/2018  WBC 4.0 - 10.5 K/uL 17.1(H) 9.8 9.6  Hemoglobin 13.0 - 17.0 g/dL 12.3(L) 14.4 14.6  Hematocrit 39.0 - 52.0 % 36.9(L) 41.3 41.6  Platelets 150 - 400 K/uL 145(L) 245 264    . CMP Latest Ref Rng & Units 12/22/2018 12/15/2018 12/12/2018  Glucose 70 - 99 mg/dL 120(H) 104(H) 87  BUN 8 - 23 mg/dL 21 26(H) 30(H)  Creatinine 0.61 - 1.24 mg/dL 0.80 0.98 1.13  Sodium 135 - 145 mmol/L 135 132(L) 133(L)  Potassium 3.5 - 5.1 mmol/L 4.5 4.4 5.6(H)  Chloride 98 - 111 mmol/L 99 95(L) 96(L)  CO2 22 - 32 mmol/L _0 Calcium 8.9 - 10.3 mg/dL 9.0 9.3 9.4  Total Protein 6.5 - 8.1 g/dL 7.1 7.0 7.3  Total Bilirubin 0.3 - 1.2 mg/dL 9.4(HH) 23.8(HH) 18.6(HH)  Alkaline Phos 38 - 126 U/L 961(H) 881(H) 815(H)  AST 15 - 41 U/L 113(H) 206(HH) 216(HH)  ALT 0 - 44 U/L 121(H) 194(H) 193(H)       RADIOGRAPHIC STUDIES: I have personally reviewed the radiological images as listed and agreed with the findings in the report. Ct Chest W Contrast  Result Date:  12/01/2018 CLINICAL DATA:  Staging for new liver neoplasm found on CT EXAM: CT CHEST WITH CONTRAST TECHNIQUE: Multidetector CT imaging of the chest was performed during intravenous contrast administration.  CONTRAST:  36m OMNIPAQUE IOHEXOL 300 MG/ML  SOLN COMPARISON:  None. FINDINGS: Cardiovascular: The heart size appears within normal limits. No pericardial effusion identified. Mild aortic atherosclerosis. Three vessel coronary artery atherosclerotic calcifications identified. Mediastinum/Nodes: Left supraclavicular lymph node is prominent measuring 1 cm, image 5/3. Left sided pre-vascular lymph node within the superior mediastinum measures 1.6 cm, image 33/3. Right paratracheal lymph node measures 1.8 cm, image 57/3. No subcarinal or right hilar adenopathy. Low left paratracheal lymph node measures 2.2 cm, image 61/3. Lungs/Pleura: Small left pleural effusion. Within the medial left upper lobe there is a paramediastinal mass measuring 6.6 x 6.2 by 10.0 cm. Tumor invades the mediastinum and left hilum. Encasement and complete occlusion of the left upper lobe bronchus identified. There is also encasement of the left pulmonary artery. Complete occlusion of left upper pulmonary vein identified. Subpleural nodule within the lateral left upper lobe measures 5 mm, multiple small pleural nodules are identified overlying the left lung compatible with pleural metastasis, for example image 101/7. 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 See report from 11/29/2018 for details. Again seen is ascites, bilateral adrenal gland enlargement and abdominal adenopathy. Musculoskeletal: Chronic bilateral posttraumatic rib deformities are noted. There is a suspicious lytic lesion involving the approximate T11 vertebra with extension into the left posterior aspect of the canal, image 90/6. Here there is a  epidural soft tissue component measuring 1.1 cm, image 90/6., image 118/3. IMPRESSION: 1. Large left upper lobe lung mass is identified with invasion of the mediastinum and left hilum. Associated left-sided pleural metastasis with pleural nodularity and effusion noted. Ipsilateral hilar and bilateral mediastinal nodal metastasis. Left supraclavicular metastasis also noted. 2. Lytic lesion involving the posterior aspect of the approximate T11 vertebra with suspected epidural tumor involvement noted. Advise further evaluation with contrast enhanced MRI of the thoracic spine. 3. Upper abdominal metastasis as described on 11/29/2018. 4. These results will be called to the ordering clinician or representative by the Radiologist Assistant, and communication documented in the PACS or zVision Dashboard. Electronically Signed   By: TKerby MoorsM.D.   On: 12/01/2018 14:01   Mr Thoracic Spine Wo Contrast  Result Date: 12/01/2018 CLINICAL DATA:  Metastatic disease of thoracic spine. EXAM: MRI THORACIC SPINE WITHOUT CONTRAST TECHNIQUE: Multiplanar, multisequence MR imaging of the thoracic spine was performed. No intravenous contrast was administered. COMPARISON:  Chest CT 12/01/2018 FINDINGS: The examination had to be discontinued prior to completion due to patient pain following liver biopsy. Only sagittal sequences were obtained. Alignment:  Normal Vertebrae: There are numerous marrow lesions of the visualized cervical spine and thoracic spine. In the incompletely visualized cervical spine, there are lesions at C6 and C7. The C7 lesion extends into the left pedicle and posterior elements. In the thoracic spine there are lesions at T2 and at all levels from T5-T12. There is also a lesion at L1. Most of the lesions are confined to the vertebral body. The lesion at T10 extends into the left pedicle and lamina. Smaller lesions at T8 and T9 also extend into the left pedicles. There is a ventral epidural component of the T10  tumor. Cord:  Normal signal and caliber. Paraspinal and other soft tissues: Thoracic soft tissues are better assessed on the concomitant chest CT. Disc levels: Limited assessment due to absence of axial imaging. No spinal canal stenosis. IMPRESSION: 1. 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  2. The largest lesion is located at T10, where there is also a component of ventral epidural tumor. 3. No spinal canal stenosis. Electronically Signed   By: Ulyses Jarred M.D.   On: 12/01/2018 19:58   Ct Abdomen Pelvis W Contrast  Result Date: 11/29/2018 CLINICAL DATA:  Upper abdominal pain for several weeks. Smoker. No history of primary malignancy per the medical record. EXAM: CT ABDOMEN AND PELVIS WITH CONTRAST TECHNIQUE: Multidetector CT imaging of the abdomen and pelvis was performed using the standard protocol following bolus administration of intravenous contrast. CONTRAST:  193m OMNIPAQUE IOHEXOL 300 MG/ML  SOLN COMPARISON:  None. FINDINGS: Lower chest: Clear lung bases. Tiny left pleural effusion with pleural irregularity, most apparent on image 3/4. Normal heart size with right coronary artery atherosclerosis. Hepatobiliary: Hepatomegaly at 21.6 cm craniocaudal. Irregular hepatic capsule with caudate and lateral segment left liver lobe enlargement. Heterogeneous density throughout the liver, with more well-defined hypoattenuating lesions including in the high left hepatic lobe at 2.1 cm on image 15/3 and in the central right hepatic lobe at 1.8 cm on the same image. The gallbladder is contracted and appears thick walled. No pericholecystic edema. No biliary duct dilatation. Pancreas: Normal, without mass or ductal dilatation. Spleen: Normal in size, without focal abnormality. Adrenals/Urinary Tract: Left greater than right adrenal thickening. Too small to characterize lesions in both kidneys. No hydronephrosis. Normal urinary bladder. Stomach/Bowel: Normal stomach,  without wall thickening. Scattered colonic diverticula. Normal terminal ileum and appendix. Normal small bowel. Vascular/Lymphatic: Aortic and branch vessel atherosclerosis. Patent portal and splenic veins. No specific evidence of portal venous hypertension. Extensive abdominal adenopathy. Index aortocaval node of 2.2 cm on image 36/3. Porta hepatis adenopathy at 2.4 cm on image 28/3. Retrocrural adenopathy at 1.1 cm on image 20/3. No pelvic sidewall adenopathy. Reproductive: Normal prostate. Other: Small volume abdominopelvic ascites. Perihepatic nodularity may represent small lymph nodes or omental metastasis including at 9 mm on image 43/3. Right inguinal hernia contains fat. Musculoskeletal: Anterior left seventh rib nonacute fracture. Lower thoracic spondylosis. IMPRESSION: 1. 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 Widespread metastatic disease from an otherwise unknown primary is possible but felt less likely. 2. Upper abdominal and lower thoracic adenopathy, most consistent with metastatic disease. 3. Small left pleural effusion with findings suspicious for pleural metastasis. 4. Probable perihepatic omental/peritoneal metastasis. 5. Coronary artery atherosclerosis. Aortic Atherosclerosis (ICD10-I70.0). 6. Small volume abdominopelvic ascites. Electronically Signed   By: KAbigail MiyamotoM.D.   On: 11/29/2018 14:34   [place]domen Limited  Result Date: 12/18/2018 CLINICAL DATA:  Liver metastases. Elevated liver enzymes. Lung carcinoma primary EXAM: ULTRASOUND ABDOMEN LIMITED RIGHT UPPER QUADRANT COMPARISON:  November 29, 2018 CT abdomen and pelvis FINDINGS: Gallbladder: No gallstones are evident. Sludge is noted in the gallbladder. The gallbladder wall is thickened and mildly edematous. There is no pericholecystic fluid. No sonographic Murphy sign noted by sonographer. Common bile duct: Diameter: 9 mm which is prominent. No mass or calculus is seen in the  biliary ductal system. Liver: Liver contour is somewhat nodular. Overall, the liver echogenicity is increased. There is diffuse inhomogeneity throughout the liver consistent with widespread metastatic disease, noted on recent CT. Focal mass in the left lobe measures 3.2 x 2.2 x 2.4 cm. Portal vein is patent on color Doppler imaging with normal direction of blood flow towards the liver. IMPRESSION: 1. Widespread metastatic disease throughout the liver. The liver has a somewhat nodular contour suggesting underlying cirrhosis. 2. No gallstones seen. Gallbladder wall  appears mildly thickened and edematous. Sludge is noted in the gallbladder. There is also prominence of the common bile duct without mass or calculus evident. These findings raise concern for potential degree of acalculus cholecystitis. In this regard, it may be prudent to consider nuclear medicine hepatobiliary imaging study to assess for cystic duct patency. These results will be called to the ordering clinician or representative by the Radiologist Assistant, and communication documented in the PACS or zVision Dashboard. Electronically Signed   By: Lowella Grip III M.D.   On: 12/18/2018 09:53   US Biopsy (liver)  Result Date: 12/01/2018 INDICATION: 63 year old male with a history of multiple liver lesions unknown primary cancer EXAM: ULTRASOUND-GUIDED BIOPSY LIVER MASS MEDICATIONS: None. ANESTHESIA/SEDATION: Moderate (conscious) sedation was employed during this procedure. A total of Versed 1.5 mg and Fentanyl 75 mcg was administered intravenously. Moderate Sedation Time: 10 minutes. The patient's level of consciousness and vital signs were monitored continuously by radiology nursing throughout the procedure under my direct supervision. FLUOROSCOPY TIME:  None COMPLICATIONS: None PROCEDURE: Informed written consent was obtained from the patient after a thorough discussion of the procedural risks, benefits and alternatives. All questions were  addressed. Maximal Sterile Barrier Technique was utilized including caps, mask, sterile gowns, sterile gloves, sterile drape, hand hygiene and skin antiseptic. A timeout was performed prior to the initiation of the procedure. Patient positioned supine position on the ultrasound table. Ultrasound survey of the liver performed with images stored sent to PACs. Patient is prepped and draped in the usual sterile fashion. 1% lidocaine was used for local anesthesia. Using ultrasound guidance, 17 gauge guide needle was advanced into heterogeneously hypoechoic mass of the right liver. Once we confirmed position of the needle tip, multiple 18 gauge core biopsy were achieved. Two Gel-Foam pledgets were infused with a small amount of saline. Needle was removed and a final image was stored. Patient tolerated the procedure well and remained hemodynamically stable throughout. No complications were encountered and no significant blood loss. IMPRESSION: Status post ultrasound-guided biopsy of right liver lesion. Tissue specimen sent to pathology for complete histopathologic analysis. Signed, Dulcy Fanny. Dellia Nims, RPVI [facility] Radiology Electronically Signed   By: Corrie Mckusick D.O.   On: 12/01/2018 17:24    ASSESSMENT & PLAN:   63 y.o. male with  1. Recently diagnosed Extensive Stage Small Cell Carcinoma  2.  Worsening liver function tests.  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  Bump in bilirubin could be from progression of his liver mets.  Cannot rule out an obstructive component that has arisen in the interim that is worsening his bilirubin and alkaline phosphatase levels.  PLAN: -Discussed pt labwork today, 12/22/18; liver enzymes improved, Total Bilirubin improved from 23.8 to 9.4. Alk Phos stable at 961. ANC at 13.4k in setting of Neulasta. -The pt has no prohibitive toxicities from dose reduced C1 Carboplatin  and Etoposide at this time. -Discussed the 12/18/18 US Abdomen which revealed Widespread metastatic disease throughout the liver. The liver has a somewhat nodular contour suggesting underlying cirrhosis. 2. No gallstones seen. Gallbladder wall appears mildly thickened and edematous. Sludge is noted in the gallbladder. There is also prominence of the common bile duct without mass or calculus evident. These findings raise concern for potential degree of acalculus cholecystitis. In this regard, it may be prudent to consider nuclear medicine hepatobiliary imaging study to assess for cystic duct patency. -Will order Hida scan, however pt is a high  risk for surgery given his extensive liver metastases. Pt also denies any acute abdominal pain or tenderness related to this at this time. -Rediscussed his goals of care in the setting of worsening liver functions and to determine if he would like to proceed with high risk palliative chemotherapy with significant dose reductions versus transitioning to hospice cares. -Patient chose to proceed with dose reduced carboplatin and etoposide chemotherapy. Received carboplatin AUC 3 and etoposide 80 mg/m only day 1 to determine tolerability of treatment initially. -Did hold off on day 2 and day 3 of etoposide treatment this cycle. -Have held off on atezolizumab with cycle 1. -After confirming with patient, a palliative care referral has been requested given the patient might have worsening symptom burden if he does not tolerate chemotherapy. -Recommend eating soft foods -Will order CT Head w/out contrast, as pt attempted but could not complete his recent MRI Brain -Will see the pt back on C2D1   HIDA scan in 5 days CT head without contrast in 1 week Please schedule C2 of chemotherapy as ordered with labs and MD on C2D1   All of the patients questions were answered with apparent satisfaction. The patient knows to call the clinic with any problems, questions or  concerns.  The total time spent in the appt was 25 minutes and more than 50% was on counseling and direct patient cares.    Sullivan Lone MD [place] AAHIVMS [facility] Hematology/Oncology Physician [facility]  (Office):       [phone] (Work cell):  [phone] (Fax):           [phone]  I, Baldwin [place], am acting as a scribe for Dr. Sullivan Lone.   .I have reviewed the above documentation for accuracy and completeness, and I agree with the above. Brunetta Genera MD

## 2018-12-19 NOTE — Telephone Encounter (Signed)
VM left for patient to introduce Palliative care and to schedule visit.

## 2018-12-22 ENCOUNTER — Inpatient Hospital Stay (HOSPITAL_BASED_OUTPATIENT_CLINIC_OR_DEPARTMENT_OTHER): Payer: Self-pay | Admitting: Hematology

## 2018-12-22 ENCOUNTER — Inpatient Hospital Stay: Payer: Self-pay

## 2018-12-22 ENCOUNTER — Telehealth: Payer: Self-pay

## 2018-12-22 ENCOUNTER — Other Ambulatory Visit: Payer: Self-pay

## 2018-12-22 VITALS — BP 128/67 | HR 85 | Temp 98.2°F | Resp 18 | Ht 70.0 in | Wt 183.4 lb

## 2018-12-22 DIAGNOSIS — C3490 Malignant neoplasm of unspecified part of unspecified bronchus or lung: Secondary | ICD-10-CM

## 2018-12-22 DIAGNOSIS — C787 Secondary malignant neoplasm of liver and intrahepatic bile duct: Secondary | ICD-10-CM

## 2018-12-22 DIAGNOSIS — R109 Unspecified abdominal pain: Secondary | ICD-10-CM

## 2018-12-22 DIAGNOSIS — M545 Low back pain: Secondary | ICD-10-CM

## 2018-12-22 DIAGNOSIS — R634 Abnormal weight loss: Secondary | ICD-10-CM

## 2018-12-22 DIAGNOSIS — R1011 Right upper quadrant pain: Secondary | ICD-10-CM

## 2018-12-22 DIAGNOSIS — Z720 Tobacco use: Secondary | ICD-10-CM

## 2018-12-22 DIAGNOSIS — Z7189 Other specified counseling: Secondary | ICD-10-CM

## 2018-12-22 LAB — CBC WITH DIFFERENTIAL (CANCER CENTER ONLY)
Abs Immature Granulocytes: 0.49 10*3/uL — ABNORMAL HIGH (ref 0.00–0.07)
Basophils Absolute: 0.1 10*3/uL (ref 0.0–0.1)
Basophils Relative: 0 %
Eosinophils Absolute: 0.1 10*3/uL (ref 0.0–0.5)
Eosinophils Relative: 1 %
HCT: 36.9 % — ABNORMAL LOW (ref 39.0–52.0)
Hemoglobin: 12.3 g/dL — ABNORMAL LOW (ref 13.0–17.0)
Immature Granulocytes: 3 %
Lymphocytes Relative: 10 %
Lymphs Abs: 1.7 10*3/uL (ref 0.7–4.0)
MCH: 31.8 pg (ref 26.0–34.0)
MCHC: 33.3 g/dL (ref 30.0–36.0)
MCV: 95.3 fL (ref 80.0–100.0)
Monocytes Absolute: 1.3 10*3/uL — ABNORMAL HIGH (ref 0.1–1.0)
Monocytes Relative: 8 %
NEUTROS ABS: 13.4 10*3/uL — AB (ref 1.7–7.7)
Neutrophils Relative %: 78 %
Platelet Count: 145 10*3/uL — ABNORMAL LOW (ref 150–400)
RBC: 3.87 MIL/uL — AB (ref 4.22–5.81)
RDW: 16 % — ABNORMAL HIGH (ref 11.5–15.5)
WBC Count: 17.1 10*3/uL — ABNORMAL HIGH (ref 4.0–10.5)
nRBC: 0.2 % (ref 0.0–0.2)

## 2018-12-22 LAB — CMP (CANCER CENTER ONLY)
ALT: 121 U/L — ABNORMAL HIGH (ref 0–44)
AST: 113 U/L — ABNORMAL HIGH (ref 15–41)
Albumin: 2.5 g/dL — ABNORMAL LOW (ref 3.5–5.0)
Alkaline Phosphatase: 961 U/L — ABNORMAL HIGH (ref 38–126)
Anion gap: 11 (ref 5–15)
BUN: 21 mg/dL (ref 8–23)
CALCIUM: 9 mg/dL (ref 8.9–10.3)
CO2: 25 mmol/L (ref 22–32)
Chloride: 99 mmol/L (ref 98–111)
Creatinine: 0.8 mg/dL (ref 0.61–1.24)
GFR, Est AFR Am: >60 mL/min (ref >60)
GFR, Estimated: >60 mL/min (ref >60)
Glucose, Bld: 120 mg/dL — ABNORMAL HIGH (ref 70–99)
Potassium: 4.5 mmol/L (ref 3.5–5.1)
Sodium: 135 mmol/L (ref 135–145)
TOTAL PROTEIN: 7.1 g/dL (ref 6.5–8.1)
Total Bilirubin: 9.4 mg/dL (ref 0.3–1.2)

## 2018-12-22 LAB — TSH: TSH: 3.811 u[IU]/mL (ref 0.320–4.118)

## 2018-12-22 NOTE — Telephone Encounter (Signed)
Received return call from patient. Discussed Palliative Care with patient. Patient would like to wait until after today's appointment with Dr.Kale and will call back palliative care tomorrow.

## 2018-12-23 ENCOUNTER — Telehealth: Payer: Self-pay | Admitting: Hematology

## 2018-12-23 LAB — T4: T4, Total: 7.5 ug/dL (ref 4.5–12.0)

## 2018-12-23 NOTE — Telephone Encounter (Signed)
Scheduled appt per 3/30 los.  A message from central radiology was sent to the MD about the HIDA scan.  They will schedule Ct and HIDA appt when they hear back from the MD.

## 2018-12-25 ENCOUNTER — Telehealth: Payer: Self-pay | Admitting: Hematology

## 2018-12-25 ENCOUNTER — Other Ambulatory Visit: Payer: Self-pay | Admitting: Hematology

## 2018-12-25 ENCOUNTER — Telehealth: Payer: Self-pay | Admitting: *Deleted

## 2018-12-25 DIAGNOSIS — C3490 Malignant neoplasm of unspecified part of unspecified bronchus or lung: Secondary | ICD-10-CM

## 2018-12-25 DIAGNOSIS — C787 Secondary malignant neoplasm of liver and intrahepatic bile duct: Secondary | ICD-10-CM

## 2018-12-25 DIAGNOSIS — G893 Neoplasm related pain (acute) (chronic): Secondary | ICD-10-CM

## 2018-12-25 MED ORDER — OXYCODONE HCL 5 MG PO TABS: 5.0000 mg | ORAL_TABLET | Freq: Four times a day (QID) | ORAL | 0 refills | Status: DC | PRN

## 2018-12-25 NOTE — Telephone Encounter (Signed)
Patient called. Problems with pain medications. Needs refill of short acting and needs office to call pharmacy to see if refill of long acting can be released early. Out of short acting pain med (refilled #60 on 3/26). Patient states taking 6/day. Only 1 long acting OxyContin left - was taking 2 tablets twice daily for a few days instead of 1 twice daily based on recommendation from on call MD.  Prescription for OxyContin at pharmacy, but they won't fill until 4/9. Advised patient that Dr. Irene Limbo will receive information and refill requests. Patient verbalized understanding.

## 2018-12-25 NOTE — Telephone Encounter (Signed)
Patient called for medication refills saying he has run out of his prescribed OxyContin and oxycodone. He was prescribed OxyContin 10 mg 60 pills which were supposed to last 01/01/2019.  He notes that he has run out of these but cannot explain why the pills went.  He was recommended to take an extra pill twice a day for 2 to 3 days by the on-call physician but would still have enough medications till 12/30/2018. Appears to be confused regarding these medications.  He was given a new prescription for oxycodone 5 to 10 mg every 4 hours as needed 60 pills.  This was supposed to last for 3 to 4 weeks and to be taken only if needed. On his last clinic visit the patient had no clear focal symptomatology of uncontrolled pain. He notes that he has been taking 2 pills every 4 hours around-the-clock despite recommendations to limit narcotics to avoid confusion in the setting of his liver cirrhosis and also to avoid abuse given his previous history of alcohol abuse.  He was strongly counseled that excessive narcotic use might put him at risk for hepatic encephalopathy.  Patient is unable to clearly explain how he has run out of his oxycodone as well in just 1 week.   I have serious concerns regarding the appropriate use of these medications either by the patient in the form of drug abuse or concerns with the possibility of drug diversion.  His ex-wife appears to have unusual interest in only the pain management aspect of his illness and specifically the narcotics and not so much the other elements of his care.  I talked to to the patient at length about the need to count his pills and make sure the medications are taken only as recommended.  He is to keep his medications under lock and key with only him having access to it since his son and his ex-wife also live in the same dwelling. We also talked about having home care come in for physical therapy and for a nurse to help him with medication management since he appears  to sound very confused regarding several of his medications despite repeated counseling. He is agreeable to this.  Given the patient's extensive small cell lung cancer with significant pathology I feel inclined to give him the benefit of doubt with regards to symptom management even though his clinical assessment and degree of pain medication use do not seem to correlate accurately.  I discussed that I cannot preempt his OxyContin prescription.  He will be given a limited supply of oxycodone 5 to 10 mg every 6 hours only as needed with an idea to minimize narcotic pain meds given his risk of hepatic encephalopathy and previous history of alcohol abuse and with an assurance that he will appropriately safeguard his pain medications.  He is aware that misuse of his pain medications or drug diversion is a serious matter and if there is any subsequent concern that we might decide to completely discontinue any narcotic prescriptions.  Cody Montoya

## 2018-12-31 ENCOUNTER — Telehealth: Payer: Self-pay | Admitting: *Deleted

## 2018-12-31 NOTE — Telephone Encounter (Signed)
Patient contacted office, LVM requesting Oxycodone 5mg  refill stating had 4 pills left. Left 2 additional voice mails stating pain was quite bad and had not seen refill on [facility] web site.  Following phone conversation with Dr. Irene Limbo, prescription for oxycodone #30 tablets on 4/2, to last until appt on 4/13. Per Dr. Irene Limbo, advise patient of following: They had a lengthy conversation on 4/2 regarding pain medication prescriptions. Prescription for oxycodone 5 mg was to last until appt on 4/13 when pain could be assessed and that he could not write another prescription at this time. If patient disagrees with this recommendation he can also contact his PCP for referral to a pain clinic.  Contacted patient with Dr. Grier Mitts advice as stated above. Patient states he does not remember these directions about how long the prescription was to last, but that the short acting isn't that effective anyway. Ibuprofen works about as good. States he will see Dr. Irene Limbo on Monday.

## 2019-01-02 ENCOUNTER — Encounter (HOSPITAL_COMMUNITY)
Admission: RE | Admit: 2019-01-02 | Discharge: 2019-01-02 | Disposition: A | Discharge status: Home / Self Care | Payer: Self-pay | Source: Ambulatory Visit | Attending: Hematology | Admitting: Hematology

## 2019-01-02 ENCOUNTER — Other Ambulatory Visit: Payer: Self-pay

## 2019-01-02 DIAGNOSIS — R1011 Right upper quadrant pain: Secondary | ICD-10-CM | POA: Insufficient documentation

## 2019-01-02 MED ORDER — TECHNETIUM TC 99M MEBROFENIN IV KIT
7.2000 | PACK | Freq: Once | INTRAVENOUS | Status: AC | PRN
  Administered 2019-01-02: 7.2 via INTRAVENOUS

## 2019-01-02 NOTE — Progress Notes (Signed)
HEMATOLOGY/ONCOLOGY CLINIC NOTE  Date of Service: 01/05/2019  Patient Care Team: Default, Provider, MD as PCP - General  CHIEF COMPLAINTS/PURPOSE OF CONSULTATION:  Recently Diagnosed Small Cell Lung Cancer  HISTORY OF PRESENTING ILLNESS:  Cody Montoya a 63 y.o.malewith medical history significant ofalcohol abuse.He does not have a primary care provider and does not seek routine medical care. Around 3 weeks ago, the patient started having epigastric abdominal pain. It would wax and wane in severity but progressively worsenedsince onset. He has had associated nausea and vomiting. His appetite has decreased and he is lost around 10 pounds. He stopped drinking when this happened. He would drink around 7-8 shots of vodka daily in addition to the 2-3 beers. He has done this for the past 4-5 years. He does not have any known history of hepatitis B or C and denies taking any medication/supplements routinely. He denies any prominent vessels, yellowing of the skin/eyes, fevers, shortness of breath, or bowel changes. The patient has been using ibuprofen for pain with little relief.  In the emergency room, a CT scan showsmultifocal hepatocellular carcinoma that is not appear to be metastatic to the liver. There is also thoracic adenopathy,omental/peritoneal involvement and left pleural effusion suggestive of metastatic disease. The oncology team was called and recommended admission for work-up.   When seen today, the patient reports that he continues to have abdominal discomfort in his upper abdomen.  Along with the discomfort in his abdomen, he also reports nausea and vomiting and increased constipation.  This is been present for about 2 to 3 weeks.  He states his appetite has been decreased and he has lost weight.  He estimates that he has lost about 10 pounds over the past few months.  Oncology was asked to see the patient to make recommendations regarding his liver  masses.  Interval History:   Cody Montoya is here for follow-up of his extensive stage small cell lung cancer. The patient's last visit with [place] was on 12/22/18. The pt reports that he is doing well overall.   The pt reports that he had two boils on his bottom which were painful, and these resolved on their own. He also notes that he developed some pain in the middle of his back, which he notes could be from standing and sitting awkwardly. The pt notes that he has been taking his long acting pain medication, and 2 short acting pain medications every 4-6 hours. The pt denies consuming any alcohol whatsoever.  He denies abdominal pains and notes that he is beginning to eat much better. He has lost weight but notes that he is aware of this and is trying to eat better. He is eating at least 3 times a day but is not consuming any meal supplements. The pt denies new leg weakness. He denies dental pains or mouth sores.  Of note since the patient's last visit, pt has had a NM Hepatobiliary including GB completed on 01/02/19 with results revealing No biliary obstruction. 2. Normal hepatobiliary scan.  Lab results today (01/05/19) of CBC w/diff and CMP is as follows: all values are WNL except for RBC at 3.27, HGB at 10.6, HCT at 32.3, RDW at 17.8, Glucose at 113, Albumin at 2.6, Alk Phos at 298, Total Bilirubin at 3.2. 01/05/19 Magnesium at 1.7  On review of systems, pt reports mid-back pain, eating better, weight loss, and denies abdominal pains, leg weakness, dental pains, mouth sores, leg swelling, and any other symptoms.   MEDICAL  HISTORY:  Past Medical History:  Diagnosis Date   Cancer ([place])     SURGICAL HISTORY: Past Surgical History:  Procedure Laterality Date   NO PAST SURGERIES      SOCIAL HISTORY: Social History   Socioeconomic History   Marital status: Single    Spouse name: Not on file   Number of children: Not on file   Years of education: Not on file   Highest  education level: Not on file  Occupational History   Occupation: Surveyor, quantity  Social Needs   Financial resource strain: Not very hard   Food insecurity:    Worry: Never true    Inability: Never true   Transportation needs:    Medical: No    Non-medical: No  Tobacco Use   Smoking status: Current Every Day Smoker    Packs/day: 1.00    Years: 45.00    Pack years: 45.00    Types: Cigarettes  Substance and Sexual Activity   Alcohol use: Yes    Alcohol/week: 8.0 standard drinks    Types: 3 Cans of beer, 5 Shots of liquor per week    Comment: no alcohol in past month due to symptoms   Drug use: Not Currently    Frequency: 7.0 times per week    Types: Marijuana   Sexual activity: Not Currently  Lifestyle   Physical activity:    Days per week: 5 days    Minutes per session: 150+ min   Stress: To some extent  Relationships   Social connections:    Talks on phone: More than three times a week    Gets together: More than three times a week    Attends religious service: Never    Active member of club or organization: No    Attends meetings of clubs or organizations: Never    Relationship status: Living with partner   Intimate partner violence:    Fear of current or ex partner: No    Emotionally abused: No    Physically abused: No    Forced sexual activity: No  Other Topics Concern   Not on file  Social History Narrative   Not on file    FAMILY HISTORY: No family history on file.  ALLERGIES:  has No Known Allergies.  MEDICATIONS:  Current Outpatient Medications  Medication Sig Dispense Refill   docusate sodium (COLACE) 100 MG capsule Take 1 capsule (100 mg total) by mouth 2 (two) times daily. 10 capsule 0   ibuprofen (ADVIL,MOTRIN) 200 MG tablet Take 800 mg by mouth every 6 (six) hours as needed for fever or moderate pain.     ondansetron (ZOFRAN) 8 MG tablet Take 1 tablet (8 mg total) by mouth 2 (two) times daily as needed for refractory nausea  / vomiting. Start on day 3 after carboplatin chemo. 30 tablet 1   oxyCODONE (OXY IR/ROXICODONE) 5 MG immediate release tablet Take 1-2 tablets (5-10 mg total) by mouth every 6 (six) hours as needed for moderate pain. 30 tablet 0   oxyCODONE (OXYCONTIN) 10 mg 12 hr tablet Take 1 tablet (10 mg total) by mouth every 12 (twelve) hours for 30 days. For cancer pain 60 tablet 0   polyethylene glycol (MIRALAX / GLYCOLAX) packet Take 17 g by mouth daily as needed for mild constipation. 14 each 0   No current facility-administered medications for this visit.    Facility-Administered Medications Ordered in Other Visits  Medication Dose Route Frequency Provider Last Rate Last Dose   oxyCODONE (  Oxy IR/ROXICODONE) immediate release tablet 5 mg  5 mg Oral Once Brunetta Genera, MD        REVIEW OF SYSTEMS:    A 10+ POINT REVIEW OF SYSTEMS WAS OBTAINED including neurology, dermatology, psychiatry, cardiac, respiratory, lymph, extremities, GI, GU, Musculoskeletal, constitutional, breasts, reproductive, HEENT.  All pertinent positives are noted in the HPI.  All others are negative.   PHYSICAL EXAMINATION: ECOG PERFORMANCE STATUS: 1-2 Vital signs reviewed in epic  GENERAL:alert, in no acute distress and comfortable SKIN: no acute rashes, jaundiced appearing skin EYES: conjunctiva are pink and non-injected, sclera icteric ++ OROPHARYNX: MMM, no exudates, no oropharyngeal erythema or ulceration NECK: supple, no JVD LYMPH:  no palpable lymphadenopathy in the cervical, axillary or inguinal regions LUNGS: clear to auscultation b/l with normal respiratory effort HEART: regular rate & rhythm ABDOMEN: Palpable hepatomegaly about 4 fingerbreadths below costal line, no TTP and negative Murphy's sign Extremity: trace pedal edema PSYCH: alert & oriented x 3 with fluent speech NEURO: no focal motor/sensory deficits   LABORATORY DATA:  I have reviewed the data as listed  . CBC Latest Ref Rng & Units  01/05/2019 12/22/2018 12/15/2018  WBC 4.0 - 10.5 K/uL 8.2 17.1(H) 9.8  Hemoglobin 13.0 - 17.0 g/dL 10.6(L) 12.3(L) 14.4  Hematocrit 39.0 - 52.0 % 32.3(L) 36.9(L) 41.3  Platelets 150 - 400 K/uL 359 145(L) 245    . CMP Latest Ref Rng & Units 01/05/2019 12/22/2018 12/15/2018  Glucose 70 - 99 mg/dL 113(H) 120(H) 104(H)  BUN 8 - 23 mg/dL 10 21 26(H)  Creatinine 0.61 - 1.24 mg/dL 0.69 0.80 0.98  Sodium 135 - 145 mmol/L 137 135 132(L)  Potassium 3.5 - 5.1 mmol/L 4.0 4.5 4.4  Chloride 98 - 111 mmol/L 104 99 95(L)  CO2 22 - 32 mmol/L 25 25 22   Calcium 8.9 - 10.3 mg/dL 9.0 9.0 9.3  Total Protein 6.5 - 8.1 g/dL 7.1 7.1 7.0  Total Bilirubin 0.3 - 1.2 mg/dL 3.2(H) 9.4(HH) 23.8(HH)  Alkaline Phos 38 - 126 U/L 298(H) 961(H) 881(H)  AST 15 - 41 U/L 24 113(H) 206(HH)  ALT 0 - 44 U/L 20 121(H) 194(H)       RADIOGRAPHIC STUDIES: I have personally reviewed the radiological images as listed and agreed with the findings in the report. Nm Hepatobiliary Including Gb  Result Date: 01/02/2019 CLINICAL DATA:  Evaluate for a calculus cholecystitis. No right upper quadrant pain. EXAM: NUCLEAR MEDICINE HEPATOBILIARY IMAGING TECHNIQUE: Sequential images of the abdomen were obtained out to 60 minutes following intravenous administration of radiopharmaceutical. RADIOPHARMACEUTICALS:  7.2 mCi Tc-84m Choletec IV COMPARISON:  None. FINDINGS: Prompt uptake and biliary excretion of activity by the liver is seen. Gallbladder activity is visualized, consistent with patency of cystic duct. Biliary activity passes into small bowel, consistent with patent common bile duct. IMPRESSION: 1. No biliary obstruction. 2. Normal hepatobiliary scan. Electronically Signed   By: HKathreen Devoid  On: 01/02/2019 09:19   [place]dom[place] Limited  Result Date: 12/18/2018 CLINICAL DATA:  Liver metastases. Elevated liver enzymes. Lung carcinoma primary EXAM: ULTRASOUND ABDOMEN LIMITED RIGHT UPPER QUADRANT COMPARISON:  November 29, 2018 CT abdomen and pelvis  FINDINGS: Gallbladder: No gallstones are evident. Sludge is noted in the gallbladder. The gallbladder wall is thickened and mildly edematous. There is no pericholecystic fluid. No sonographic Murphy sign noted by sonographer. Common bile duct: Diameter: 9 mm which is prominent. No mass or calculus is seen in the biliary ductal system. Liver: Liver contour is somewhat nodular. Overall,  the liver echogenicity is increased. There is diffuse inhomogeneity throughout the liver consistent with widespread metastatic disease, noted on recent CT. Focal mass in the left lobe measures 3.2 x 2.2 x 2.4 cm. Portal vein is patent on color Doppler imaging with normal direction of blood flow towards the liver. IMPRESSION: 1. Widespread metastatic disease throughout the liver. The liver has a somewhat nodular contour suggesting underlying cirrhosis. 2. No gallstones seen. Gallbladder wall appears mildly thickened and edematous. Sludge is noted in the gallbladder. There is also prominence of the common bile duct without mass or calculus evident. These findings raise concern for potential degree of acalculus cholecystitis. In this regard, it may be prudent to consider nuclear medicine hepatobiliary imaging study to assess for cystic duct patency. These results will be called to the ordering clinician or representative by the Radiologist Assistant, and communication documented in the PACS or zVision Dashboard. Electronically Signed   By: Lowella Grip III M.D.   On: 12/18/2018 09:53    ASSESSMENT & PLAN:   63 y.o. male with  1. Recently diagnosed Extensive Stage Small Cell Carcinoma   2.  Abnormal liver function tests.  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  Bump in bilirubin could be from progression of his liver mets.   12/18/18 US Abdomen revealed Widespread metastatic disease throughout the liver. The liver has a somewhat nodular contour suggesting underlying  cirrhosis. 2. No gallstones seen. Gallbladder wall appears mildly thickened and edematous. Sludge is noted in the gallbladder. There is also prominence of the common bile duct without mass or calculus evident. These findings raise concern for potential degree of acalculus cholecystitis. In this regard, it may be prudent to consider nuclear medicine hepatobiliary imaging study to assess for cystic duct patency.  PLAN: -Discussed pt labwork today, 01/05/19; mild anemia with HGB at 10.6, other blood counts are stable. Total Bilirubin improved to 3.2. Alk Phos improved to 298. AST and ALT have both normalized. -Discussed the 01/02/19 NM Hepatobiliary including GB which revealed No biliary obstruction. 2. Normal hepatobiliary scan. -The pt has no prohibitive toxicities from continuing C2 with Carboplatin AUC4, 54m/m2 Etoposide over 3 days, and Atezolizumab at this time. -Will begin Xgeva every 6 weeks for bone strengthening, denies dental pains, discussed risk of non-healing ulcers -Offered to refer the pt to Rad Onc for consideration of RT to T10 for back pain. He would like to hold off on this for now. -Discussed pain management strategies again with the pt: 1-2 short acting Oxycodone every 4-6 hours as needed. Continue long acting Oxycontin. -Will refer the pt to our nutritional therapist BErnestene Kiel-Pt did receive C1 Carboplatin AUC 3 and Etoposide 80 mg/m only day 1 to determine tolerability of treatment initially. Did hold off on day 2 and day 3 of etoposide treatment for Cycle 1. Also held off on atezolizumab with cycle 1. -After confirming with patient, a palliative care referral has been requested given the patient might have worsening symptom burden if he does not tolerate chemotherapy. -Recommend eating soft foods -Did order CT Head w/out contrast, as pt attempted but could not complete his recent MRI Brain -Will see the pt back in 10 days for toxicity check   XAlphonse Guildstarting today  (with every other chemotherapy) RTC with Dr KIrene Limboin 10 days with labs for toxicity check Plz schedule next cycle of chemotherapy as per orders in 3 weeks with labs and MD visit [facility]z [facility]   All of the patients questions were answered with apparent satisfaction. The patient knows to call the clinic with any problems, questions or concerns.  The total time spent in the appt was 40 minutes and more than 50% was on counseling and direct patient cares.    Sullivan Lone MD [place] AAHIVMS [facility] Hematology/Oncology Physician [facility]  (Office):       [phone] (Work cell):  [phone] (Fax):           [phone]  I, Baldwin [place], am acting as a scribe for Dr. Sullivan Lone.   .I have reviewed the above documentation for accuracy and completeness, and I agree with the above. Brunetta Genera MD

## 2019-01-05 ENCOUNTER — Telehealth: Payer: Self-pay | Admitting: Hematology

## 2019-01-05 ENCOUNTER — Inpatient Hospital Stay (HOSPITAL_BASED_OUTPATIENT_CLINIC_OR_DEPARTMENT_OTHER): Payer: Self-pay | Admitting: Hematology

## 2019-01-05 ENCOUNTER — Other Ambulatory Visit: Payer: Self-pay

## 2019-01-05 ENCOUNTER — Inpatient Hospital Stay: Payer: Self-pay

## 2019-01-05 ENCOUNTER — Inpatient Hospital Stay: Payer: Self-pay | Attending: Hematology

## 2019-01-05 ENCOUNTER — Telehealth: Payer: Self-pay

## 2019-01-05 VITALS — BP 116/71 | HR 81 | Temp 98.4°F | Resp 18 | Ht 70.0 in | Wt 181.3 lb

## 2019-01-05 DIAGNOSIS — C787 Secondary malignant neoplasm of liver and intrahepatic bile duct: Secondary | ICD-10-CM

## 2019-01-05 DIAGNOSIS — C3411 Malignant neoplasm of upper lobe, right bronchus or lung: Secondary | ICD-10-CM | POA: Insufficient documentation

## 2019-01-05 DIAGNOSIS — Z5111 Encounter for antineoplastic chemotherapy: Secondary | ICD-10-CM

## 2019-01-05 DIAGNOSIS — Z7189 Other specified counseling: Secondary | ICD-10-CM

## 2019-01-05 DIAGNOSIS — C3490 Malignant neoplasm of unspecified part of unspecified bronchus or lung: Secondary | ICD-10-CM

## 2019-01-05 DIAGNOSIS — M545 Low back pain: Secondary | ICD-10-CM

## 2019-01-05 DIAGNOSIS — R1011 Right upper quadrant pain: Secondary | ICD-10-CM

## 2019-01-05 DIAGNOSIS — Z720 Tobacco use: Secondary | ICD-10-CM

## 2019-01-05 DIAGNOSIS — Z5189 Encounter for other specified aftercare: Secondary | ICD-10-CM | POA: Insufficient documentation

## 2019-01-05 DIAGNOSIS — D649 Anemia, unspecified: Secondary | ICD-10-CM

## 2019-01-05 DIAGNOSIS — G893 Neoplasm related pain (acute) (chronic): Secondary | ICD-10-CM

## 2019-01-05 DIAGNOSIS — Z79899 Other long term (current) drug therapy: Secondary | ICD-10-CM | POA: Insufficient documentation

## 2019-01-05 DIAGNOSIS — R748 Abnormal levels of other serum enzymes: Secondary | ICD-10-CM

## 2019-01-05 LAB — CBC WITH DIFFERENTIAL/PLATELET
Abs Immature Granulocytes: 0.02 10*3/uL (ref 0.00–0.07)
Basophils Absolute: 0.1 10*3/uL (ref 0.0–0.1)
Basophils Relative: 1 %
Eosinophils Absolute: 0.1 10*3/uL (ref 0.0–0.5)
Eosinophils Relative: 1 %
HCT: 32.3 % — ABNORMAL LOW (ref 39.0–52.0)
Hemoglobin: 10.6 g/dL — ABNORMAL LOW (ref 13.0–17.0)
Immature Granulocytes: 0 %
Lymphocytes Relative: 26 %
Lymphs Abs: 2.1 10*3/uL (ref 0.7–4.0)
MCH: 32.4 pg (ref 26.0–34.0)
MCHC: 32.8 g/dL (ref 30.0–36.0)
MCV: 98.8 fL (ref 80.0–100.0)
Monocytes Absolute: 0.7 10*3/uL (ref 0.1–1.0)
Monocytes Relative: 9 %
Neutro Abs: 5.3 10*3/uL (ref 1.7–7.7)
Neutrophils Relative %: 63 %
Platelets: 359 10*3/uL (ref 150–400)
RBC: 3.27 MIL/uL — ABNORMAL LOW (ref 4.22–5.81)
RDW: 17.8 % — ABNORMAL HIGH (ref 11.5–15.5)
WBC: 8.2 10*3/uL (ref 4.0–10.5)
nRBC: 0 % (ref 0.0–0.2)

## 2019-01-05 LAB — CMP (CANCER CENTER ONLY)
ALT: 20 U/L (ref 0–44)
AST: 24 U/L (ref 15–41)
Albumin: 2.6 g/dL — ABNORMAL LOW (ref 3.5–5.0)
Alkaline Phosphatase: 298 U/L — ABNORMAL HIGH (ref 38–126)
Anion gap: 8 (ref 5–15)
BUN: 10 mg/dL (ref 8–23)
CO2: 25 mmol/L (ref 22–32)
Calcium: 9 mg/dL (ref 8.9–10.3)
Chloride: 104 mmol/L (ref 98–111)
Creatinine: 0.69 mg/dL (ref 0.61–1.24)
GFR, Est AFR Am: >60 mL/min (ref >60)
GFR, Estimated: >60 mL/min (ref >60)
Glucose, Bld: 113 mg/dL — ABNORMAL HIGH (ref 70–99)
Potassium: 4 mmol/L (ref 3.5–5.1)
Sodium: 137 mmol/L (ref 135–145)
Total Bilirubin: 3.2 mg/dL — ABNORMAL HIGH (ref 0.3–1.2)
Total Protein: 7.1 g/dL (ref 6.5–8.1)

## 2019-01-05 LAB — TSH: TSH: 1.922 u[IU]/mL (ref 0.320–4.118)

## 2019-01-05 LAB — MAGNESIUM: Magnesium: 1.7 mg/dL (ref 1.7–2.4)

## 2019-01-05 MED ORDER — PALONOSETRON HCL INJECTION 0.25 MG/5ML
INTRAVENOUS | Status: AC
  Filled 2019-01-05: qty 5

## 2019-01-05 MED ORDER — SODIUM CHLORIDE 0.9 % IV SOLN
Freq: Once | INTRAVENOUS | Status: AC
  Administered 2019-01-05: 14:00:00 via INTRAVENOUS
  Filled 2019-01-05: qty 5

## 2019-01-05 MED ORDER — SODIUM CHLORIDE 0.9 % IV SOLN
1200.0000 mg | Freq: Once | INTRAVENOUS | Status: AC
  Administered 2019-01-05: 1200 mg via INTRAVENOUS
  Filled 2019-01-05: qty 20

## 2019-01-05 MED ORDER — SODIUM CHLORIDE 0.9 % IV SOLN
Freq: Once | INTRAVENOUS | Status: AC
  Administered 2019-01-05: 13:00:00 via INTRAVENOUS
  Filled 2019-01-05: qty 250

## 2019-01-05 MED ORDER — SODIUM CHLORIDE 0.9 % IV SOLN
50.0000 mg/m2 | Freq: Once | INTRAVENOUS | Status: AC
  Administered 2019-01-05: 110 mg via INTRAVENOUS
  Filled 2019-01-05: qty 5.5

## 2019-01-05 MED ORDER — SODIUM CHLORIDE 0.9 % IV SOLN
550.0000 mg | Freq: Once | INTRAVENOUS | Status: AC
  Administered 2019-01-05: 16:00:00 550 mg via INTRAVENOUS
  Filled 2019-01-05: qty 55

## 2019-01-05 MED ORDER — PALONOSETRON HCL INJECTION 0.25 MG/5ML
0.2500 mg | Freq: Once | INTRAVENOUS | Status: AC
  Administered 2019-01-05: 0.25 mg via INTRAVENOUS

## 2019-01-05 MED ORDER — OXYCODONE HCL 5 MG PO TABS: 5.0000 mg | ORAL_TABLET | Freq: Four times a day (QID) | ORAL | 0 refills | Status: DC | PRN

## 2019-01-05 NOTE — Progress Notes (Signed)
OK to treat with bilirubin result and labs today per MD Irene Limbo

## 2019-01-05 NOTE — Telephone Encounter (Signed)
Scheduled appt per 4/13 los.  With the injection MD requested for today, infusion still waiting to hear from pharmacy to see if he will be able to get it today. I went ahead and scheduled the next one, 6 weeks from today

## 2019-01-05 NOTE — Progress Notes (Signed)
Pt in agreement to change from Udenyca to Onpro to decrease clinic visits/limit exposure during [place]. Order changed, scheduling updated. Theotis Burrow to assist in getting pt enrolled for Xgeva & Onpro replacement w/ [employer]. Kennith Center, Pharm.D., CPP 01/05/2019@3 :38 PM

## 2019-01-05 NOTE — Telephone Encounter (Signed)
Patient contacted for Palliative Care visit.  Due to the current COVID-19 infection/crises, the patient and family prefer, and have given their verbal consent for, a provider visit via telemedicine. HIPPA policies of confidentially were discussed and patient/family expressed understanding. Palliative Visit scheduled for 01/06/2019

## 2019-01-05 NOTE — Patient Instructions (Signed)
[facility] Discharge Instructions for Patients Receiving Chemotherapy  Today you received the following chemotherapy agents Atezolizumab (TECENTRIQ), Carboplatin (PARAPLATIN) & Etoposide (VEPESID).  To help prevent nausea and vomiting after your treatment, we encourage you to take your nausea medication as prescribed.   If you develop nausea and vomiting that is not controlled by your nausea medication, call the clinic.   BELOW ARE SYMPTOMS THAT SHOULD BE REPORTED IMMEDIATELY:  *FEVER GREATER THAN 100.5 F  *CHILLS WITH OR WITHOUT FEVER  NAUSEA AND VOMITING THAT IS NOT CONTROLLED WITH YOUR NAUSEA MEDICATION  *UNUSUAL SHORTNESS OF BREATH  *UNUSUAL BRUISING OR BLEEDING  TENDERNESS IN MOUTH AND THROAT WITH OR WITHOUT PRESENCE OF ULCERS  *URINARY PROBLEMS  *BOWEL PROBLEMS  UNUSUAL RASH Items with * indicate a potential emergency and should be followed up as soon as possible.  Feel free to call the clinic should you have any questions or concerns. The clinic phone number is [phone].  Please show the [place] at check-in to the Emergency Department and triage nurse.  Atezolizumab injection What is this medicine? ATEZOLIZUMAB (a te zoe LIZ ue mab) is a monoclonal antibody. It is used to treat bladder cancer (urothelial cancer), non-small cell lung cancer, small cell lung cancer, and breast cancer. This medicine may be used for other purposes; ask your health care provider or pharmacist if you have questions. COMMON BRAND NAME(S): Tecentriq What should I tell my health care provider before I take this medicine? They need to know if you have any of these conditions: -diabetes -immune system problems -infection -inflammatory bowel disease -liver disease -lung or breathing disease -lupus -nervous system problems like myasthenia gravis or Guillain-Barre syndrome -organ transplant -an unusual or allergic reaction to atezolizumab, other medicines,  foods, dyes, or preservatives -pregnant or trying to get pregnant -breast-feeding How should I use this medicine? This medicine is for infusion into a vein. It is given by a health care professional in a hospital or clinic setting. A special MedGuide will be given to you before each treatment. Be sure to read this information carefully each time. Talk to your pediatrician regarding the use of this medicine in children. Special care may be needed. Overdosage: If you think you have taken too much of this medicine contact a poison control center or emergency room at once. NOTE: This medicine is only for you. Do not share this medicine with others. What if I miss a dose? It is important not to miss your dose. Call your doctor or health care professional if you are unable to keep an appointment. What may interact with this medicine? Interactions have not been studied. This list may not describe all possible interactions. Give your health care provider a list of all the medicines, herbs, non-prescription drugs, or dietary supplements you use. Also tell them if you smoke, drink alcohol, or use illegal drugs. Some items may interact with your medicine. What should I watch for while using this medicine? Your condition will be monitored carefully while you are receiving this medicine. You may need blood work done while you are taking this medicine. Do not become pregnant while taking this medicine or for at least 5 months after stopping it. Women should inform their doctor if they wish to become pregnant or think they might be pregnant. There is a potential for serious side effects to an unborn child. Talk to your health care professional or pharmacist for more information. Do not breast-feed an infant while taking this medicine  or for at least 5 months after the last dose. What side effects may I notice from receiving this medicine? Side effects that you should report to your doctor or health care  professional as soon as possible: -allergic reactions like skin rash, itching or hives, swelling of the face, lips, or tongue -black, tarry stools -bloody or watery diarrhea -breathing problems -changes in vision -chest pain or chest tightness -chills -facial flushing -fever -headache -signs and symptoms of high blood sugar such as dizziness; dry mouth; dry skin; fruity breath; nausea; stomach pain; increased hunger or thirst; increased urination -signs and symptoms of liver injury like dark yellow or brown urine; general ill feeling or flu-like symptoms; light-colored stools; loss of appetite; nausea; right upper belly pain; unusually weak or tired; yellowing of the eyes or skin -stomach pain -trouble passing urine or change in the amount of urine Side effects that usually do not require medical attention (report to your doctor or health care professional if they continue or are bothersome): -cough -diarrhea -joint pain -muscle pain -muscle weakness -tiredness -weight loss This list may not describe all possible side effects. Call your doctor for medical advice about side effects. You may report side effects to FDA at 1-800-FDA-1088. Where should I keep my medicine? This drug is given in a hospital or clinic and will not be stored at home. NOTE: This sheet is a summary. It may not cover all possible information. If you have questions about this medicine, talk to your doctor, pharmacist, or health care provider.  2019 Elsevier/Gold Standard (2017-12-13 09:33:38)  Coronavirus (COVID-19) Are you at risk?  Are you at risk for the Coronavirus (COVID-19)?  To be considered HIGH RISK for Coronavirus (COVID-19), you have to meet the following criteria:  . Traveled to [place], [place], [place], [place] or [place]; or in the [place] to [place], [place], [place], or [place]; and have fever, cough, and shortness of breath within the last 2 weeks of travel OR . Been in close  contact with a person diagnosed with COVID-19 within the last 2 weeks and have fever, cough, and shortness of breath . IF YOU DO NOT MEET THESE CRITERIA, YOU ARE CONSIDERED LOW RISK FOR COVID-19.  What to do if you are HIGH RISK for COVID-19?  Marland Kitchen If you are having a medical emergency, call 911. . Seek medical care right away. Before you go to a doctor's office, urgent care or emergency department, call ahead and tell them about your recent travel, contact with someone diagnosed with COVID-19, and your symptoms. You should receive instructions from your physician's office regarding next steps of care.  . When you arrive at healthcare provider, tell the healthcare staff immediately you have returned from visiting [place], [place], [place], [place] or [place]; or traveled in the [place] to [place], [place], [place], or [place]; in the last two weeks or you have been in close contact with a person diagnosed with COVID-19 in the last 2 weeks.   . Tell the health care staff about your symptoms: fever, cough and shortness of breath. . After you have been seen by a medical provider, you will be either: o Tested for (COVID-19) and discharged home on quarantine except to seek medical care if symptoms worsen, and asked to  - Stay home and avoid contact with others until you get your results (4-5 days)  - Avoid travel on public transportation if possible (such as bus, train, or airplane) or o [employer]  to the Emergency Department by EMS for evaluation, COVID-19 testing, and possible admission depending on your condition and test results.  What to do if you are LOW RISK for COVID-19?  Reduce your risk of any infection by using the same precautions used for avoiding the common cold or flu:  Marland Kitchen Wash your hands often with soap and warm water for at least 20 seconds.  If soap and water are not readily available, use an alcohol-based hand sanitizer with at least 60% alcohol.  . If coughing or sneezing, cover  your mouth and nose by coughing or sneezing into the elbow areas of your shirt or coat, into a tissue or into your sleeve (not your hands). . Avoid shaking hands with others and consider head nods or verbal greetings only. . Avoid touching your eyes, nose, or mouth with unwashed hands.  . Avoid close contact with people who are sick. . Avoid places or events with large numbers of people in one location, like concerts or sporting events. . Carefully consider travel plans you have or are making. . If you are planning any travel outside or inside the [place], visit the CDC's Travelers' Health webpage for the latest health notices. . If you have some symptoms but not all symptoms, continue to monitor at home and seek medical attention if your symptoms worsen. . If you are having a medical emergency, call 911.   [place] / e-Visit: [email or web address]         [facility]: [facility]: [phone]                   [facility]: [phone]

## 2019-01-06 ENCOUNTER — Other Ambulatory Visit: Payer: Self-pay | Admitting: Internal Medicine

## 2019-01-06 ENCOUNTER — Inpatient Hospital Stay: Payer: Self-pay

## 2019-01-06 ENCOUNTER — Telehealth: Payer: Self-pay | Admitting: *Deleted

## 2019-01-06 ENCOUNTER — Other Ambulatory Visit: Payer: Self-pay

## 2019-01-06 ENCOUNTER — Encounter: Payer: Self-pay | Admitting: Pharmacy Technician

## 2019-01-06 ENCOUNTER — Telehealth: Payer: Self-pay

## 2019-01-06 VITALS — BP 120/66 | HR 60 | Temp 98.3°F | Resp 20

## 2019-01-06 DIAGNOSIS — Z7189 Other specified counseling: Secondary | ICD-10-CM

## 2019-01-06 DIAGNOSIS — Z515 Encounter for palliative care: Secondary | ICD-10-CM

## 2019-01-06 DIAGNOSIS — C3490 Malignant neoplasm of unspecified part of unspecified bronchus or lung: Secondary | ICD-10-CM

## 2019-01-06 LAB — T4: T4, Total: 7 ug/dL (ref 4.5–12.0)

## 2019-01-06 MED ORDER — DEXAMETHASONE SODIUM PHOSPHATE 10 MG/ML IJ SOLN
10.0000 mg | Freq: Once | INTRAMUSCULAR | Status: AC
  Administered 2019-01-06: 10 mg via INTRAVENOUS

## 2019-01-06 MED ORDER — SODIUM CHLORIDE 0.9 % IV SOLN
50.0000 mg/m2 | Freq: Once | INTRAVENOUS | Status: AC
  Administered 2019-01-06: 10:00:00 110 mg via INTRAVENOUS
  Filled 2019-01-06: qty 5.5

## 2019-01-06 MED ORDER — DEXAMETHASONE SODIUM PHOSPHATE 10 MG/ML IJ SOLN
INTRAMUSCULAR | Status: AC
  Filled 2019-01-06: qty 1

## 2019-01-06 MED ORDER — SODIUM CHLORIDE 0.9 % IV SOLN
Freq: Once | INTRAVENOUS | Status: AC
  Administered 2019-01-06: 09:00:00 via INTRAVENOUS
  Filled 2019-01-06: qty 250

## 2019-01-06 NOTE — Progress Notes (Signed)
The patient is approved for drug assistance by [employer] for Udenyca. Enrollment is effective until 01/05/20 and is based on self pay. Drug replacement will cover DOS 12/18/18.  Neulasta OnPro will now be ordered for the patient instead of Udenyca to avoid return visits to the [place] during the [place] pandemic.

## 2019-01-06 NOTE — Telephone Encounter (Signed)
Spoke with [place], patient's wife, to verify email address. Lovey Newcomer shared that they would prefer a phone call for the Palliative Visit today as they were not set up for telehealth visit today. Lovey Newcomer shared that patient continues to be in pain and has anxiety. He was diagnosed with lung ca in March 2020 and the oncologist gave a poor prognosis. Lovey Newcomer shared some of her caregiving stresses. Active listening and support offered.

## 2019-01-06 NOTE — Telephone Encounter (Signed)
Felt a little woozy and a tittle tired last night, but otherwise ok. States he feels pretty good right now after chemo today. Encouraged to drink fluids and remain hydrated and contact office for questions or concerns. Patient verbalized understanding.

## 2019-01-06 NOTE — Telephone Encounter (Signed)
-----   Message from Georgianne Fick, RN sent at 01/05/2019  4:35 PM EDT ----- Regarding: Dr. Irene Limbo First time Tecentriq Patient received first time Tecentriq today and tolerated it well.

## 2019-01-06 NOTE — Patient Instructions (Signed)
[facility] Discharge Instructions for Patients Receiving Chemotherapy  Today you received the following chemotherapy agents Etoposide (VEPESID).  To help prevent nausea and vomiting after your treatment, we encourage you to take your nausea medication as prescribed.   If you develop nausea and vomiting that is not controlled by your nausea medication, call the clinic.   BELOW ARE SYMPTOMS THAT SHOULD BE REPORTED IMMEDIATELY:  *FEVER GREATER THAN 100.5 F  *CHILLS WITH OR WITHOUT FEVER  NAUSEA AND VOMITING THAT IS NOT CONTROLLED WITH YOUR NAUSEA MEDICATION  *UNUSUAL SHORTNESS OF BREATH  *UNUSUAL BRUISING OR BLEEDING  TENDERNESS IN MOUTH AND THROAT WITH OR WITHOUT PRESENCE OF ULCERS  *URINARY PROBLEMS  *BOWEL PROBLEMS  UNUSUAL RASH Items with * indicate a potential emergency and should be followed up as soon as possible.  Feel free to call the clinic should you have any questions or concerns. The clinic phone number is [phone].  Please show the [facility] at check-in to the Emergency Department and triage nurse.  Coronavirus (COVID-19) Are you at risk?  Are you at risk for the Coronavirus (COVID-19)?  To be considered HIGH RISK for Coronavirus (COVID-19), you have to meet the following criteria:  . Traveled to [place], [place], [place], [place] or [place]; or in the [place] to [place], [place], [place], or [place]; and have fever, cough, and shortness of breath within the last 2 weeks of travel OR . Been in close contact with a person diagnosed with COVID-19 within the last 2 weeks and have fever, cough, and shortness of breath . IF YOU DO NOT MEET THESE CRITERIA, YOU ARE CONSIDERED LOW RISK FOR COVID-19.  What to do if you are HIGH RISK for COVID-19?  Marland Kitchen If you are having a medical emergency, call 911. . Seek medical care right away. Before you go to a doctor's office, urgent care or emergency department, call ahead and tell them  about your recent travel, contact with someone diagnosed with COVID-19, and your symptoms. You should receive instructions from your physician's office regarding next steps of care.  . When you arrive at healthcare provider, tell the healthcare staff immediately you have returned from visiting [place], [place], [place], [place] or [place]; or traveled in the [place] to [place], [place], [place], or [place]; in the last two weeks or you have been in close contact with a person diagnosed with COVID-19 in the last 2 weeks.   . Tell the health care staff about your symptoms: fever, cough and shortness of breath. . After you have been seen by a medical provider, you will be either: o Tested for (COVID-19) and discharged home on quarantine except to seek medical care if symptoms worsen, and asked to  - Stay home and avoid contact with others until you get your results (4-5 days)  - Avoid travel on public transportation if possible (such as bus, train, or airplane) or o Sent to the Emergency Department by EMS for evaluation, COVID-19 testing, and possible admission depending on your condition and test results.  What to do if you are LOW RISK for COVID-19?  Reduce your risk of any infection by using the same precautions used for avoiding the common cold or flu:  Marland Kitchen Wash your hands often with soap and warm water for at least 20 seconds.  If soap and water are not readily available, use an alcohol-based hand sanitizer with at least 60% alcohol.  . If coughing or  sneezing, cover your mouth and nose by coughing or sneezing into the elbow areas of your shirt or coat, into a tissue or into your sleeve (not your hands). . Avoid shaking hands with others and consider head nods or verbal greetings only. . Avoid touching your eyes, nose, or mouth with unwashed hands.  . Avoid close contact with people who are sick. . Avoid places or events with large numbers of people in one location, like concerts or  sporting events. . Carefully consider travel plans you have or are making. . If you are planning any travel outside or inside the [place], visit the CDC's Travelers' Health webpage for the latest health notices. . If you have some symptoms but not all symptoms, continue to monitor at home and seek medical attention if your symptoms worsen. . If you are having a medical emergency, call 911.   [place] / e-Visit: [email or web address]         [facility]: [facility]: [phone]                   [facility]: [phone]

## 2019-01-06 NOTE — Progress Notes (Signed)
    [facility] Palliative Care Consult Note Telephone: [phone]  Fax: [phone]  PATIENT NAME: Cody Montoya DOB: 1956-02-02 MRN: [id number]  PRIMARY CARE PROVIDER:   Default, Provider, MD  REFERRING PROVIDER:  Brunetta Genera, MD [address] [place], [place] [address]  RESPONSIBLE PARTY:   Self       Renato Battles Gunnerson(ex-[phone];  Elberta Fortis Bolser(son)   RECOMMENDATIONS and PLAN:  Palliative Care Encounter Z51.5  1.  Advance care planning:  Explanation of palliative and hospice care. Discussed goals of care which are to continue all recommended treatments of cancer.  He desires to improve strength and return to normal activities.  Advanced directives reviewed and are for full scope of treatment.  Palliative care will continue to follow patient and monitor trajectory.  2. Cancer related pain:  Currently managed with analgesics per oncologist.  Notify providers if change in quality and degree of pain.  Monitor.  3.  Weakness:  Related to advanced cancer and chemo.  Encouraged balanced meals and hydration.  Scheduled rest periods.  Due to the COVID-19 crisis, this visit was performed via telehealth from my office and was initiated and consented by this patient and or family.  I spent 30 minutes providing this consultation,  from 1500 to 1530. More than 50% of the time in this consultation was spent coordinating communication with patient and Katharine Look Bushnell(ex-wife and HCPOA).   HISTORY OF PRESENT ILLNESS:  Cody Montoya is a 63 y.o. year old male with multiple medical problems including metastatic lung cancer Palliative Care was asked to help address goals of care.   CODE STATUS: Full Code  PPS:  50% HOSPICE ELIGIBILITY/DIAGNOSIS: TBD/ Continues treatment of cancer   PHYSICAL EXAM:   General: NAD, chronically ill appearing Pulmonary: no increased respiratory effort Abdomen: unable to assess Skin: pale  in color Neurological: Alert and oriented.  Appropriate conversation  Gonzella Lex, NP-C

## 2019-01-07 ENCOUNTER — Inpatient Hospital Stay: Payer: Self-pay

## 2019-01-07 ENCOUNTER — Other Ambulatory Visit: Payer: Self-pay

## 2019-01-07 VITALS — BP 131/71 | HR 60 | Temp 97.9°F | Resp 16

## 2019-01-07 DIAGNOSIS — Z7189 Other specified counseling: Secondary | ICD-10-CM

## 2019-01-07 DIAGNOSIS — C7951 Secondary malignant neoplasm of bone: Secondary | ICD-10-CM

## 2019-01-07 DIAGNOSIS — C3490 Malignant neoplasm of unspecified part of unspecified bronchus or lung: Secondary | ICD-10-CM

## 2019-01-07 MED ORDER — DENOSUMAB 120 MG/1.7ML ~~LOC~~ SOLN
SUBCUTANEOUS | Status: AC
  Filled 2019-01-07: qty 1.7

## 2019-01-07 MED ORDER — PEGFILGRASTIM 6 MG/0.6ML ~~LOC~~ PSKT
PREFILLED_SYRINGE | SUBCUTANEOUS | Status: AC
  Filled 2019-01-07: qty 0.6

## 2019-01-07 MED ORDER — SODIUM CHLORIDE 0.9 % IV SOLN
50.0000 mg/m2 | Freq: Once | INTRAVENOUS | Status: AC
  Administered 2019-01-07: 110 mg via INTRAVENOUS
  Filled 2019-01-07: qty 5.5

## 2019-01-07 MED ORDER — DEXAMETHASONE SODIUM PHOSPHATE 10 MG/ML IJ SOLN
INTRAMUSCULAR | Status: AC
  Filled 2019-01-07: qty 1

## 2019-01-07 MED ORDER — SODIUM CHLORIDE 0.9 % IV SOLN
Freq: Once | INTRAVENOUS | Status: AC
  Administered 2019-01-07: 09:00:00 via INTRAVENOUS
  Filled 2019-01-07: qty 250

## 2019-01-07 MED ORDER — PEGFILGRASTIM 6 MG/0.6ML ~~LOC~~ PSKT
6.0000 mg | PREFILLED_SYRINGE | Freq: Once | SUBCUTANEOUS | Status: AC
  Administered 2019-01-07: 6 mg via SUBCUTANEOUS

## 2019-01-07 MED ORDER — DENOSUMAB 120 MG/1.7ML ~~LOC~~ SOLN
120.0000 mg | Freq: Once | SUBCUTANEOUS | Status: AC
  Administered 2019-01-07: 120 mg via SUBCUTANEOUS

## 2019-01-07 MED ORDER — DEXAMETHASONE SODIUM PHOSPHATE 10 MG/ML IJ SOLN
10.0000 mg | Freq: Once | INTRAMUSCULAR | Status: AC
  Administered 2019-01-07: 10 mg via INTRAVENOUS

## 2019-01-07 NOTE — Patient Instructions (Signed)
[facility] Discharge Instructions for Patients Receiving Chemotherapy  Today you received the following chemotherapy agents Etoposide (VEPESID).  To help prevent nausea and vomiting after your treatment, we encourage you to take your nausea medication as prescribed.   If you develop nausea and vomiting that is not controlled by your nausea medication, call the clinic.   BELOW ARE SYMPTOMS THAT SHOULD BE REPORTED IMMEDIATELY:  *FEVER GREATER THAN 100.5 F  *CHILLS WITH OR WITHOUT FEVER  NAUSEA AND VOMITING THAT IS NOT CONTROLLED WITH YOUR NAUSEA MEDICATION  *UNUSUAL SHORTNESS OF BREATH  *UNUSUAL BRUISING OR BLEEDING  TENDERNESS IN MOUTH AND THROAT WITH OR WITHOUT PRESENCE OF ULCERS  *URINARY PROBLEMS  *BOWEL PROBLEMS  UNUSUAL RASH Items with * indicate a potential emergency and should be followed up as soon as possible.  Feel free to call the clinic should you have any questions or concerns. The clinic phone number is [phone].  Please show the [facility] at check-in to the Emergency Department and triage nurse.    Coronavirus (COVID-19) Are you at risk?  Are you at risk for the Coronavirus (COVID-19)?  To be considered HIGH RISK for Coronavirus (COVID-19), you have to meet the following criteria:  . Traveled to [place], [place], [place], [place] or [place]; or in the [place] to [place], [place], [place], or [place]; and have fever, cough, and shortness of breath within the last 2 weeks of travel OR . Been in close contact with a person diagnosed with COVID-19 within the last 2 weeks and have fever, cough, and shortness of breath . IF YOU DO NOT MEET THESE CRITERIA, YOU ARE CONSIDERED LOW RISK FOR COVID-19.  What to do if you are HIGH RISK for COVID-19?  Marland Kitchen If you are having a medical emergency, call 911. . Seek medical care right away. Before you go to a doctor's office, urgent care or emergency department, call ahead and tell them  about your recent travel, contact with someone diagnosed with COVID-19, and your symptoms. You should receive instructions from your physician's office regarding next steps of care.  . When you arrive at healthcare provider, tell the healthcare staff immediately you have returned from visiting [place], [place], [place], [place] or [place]; or traveled in the [place] to [place], [place], [place], or [place]; in the last two weeks or you have been in close contact with a person diagnosed with COVID-19 in the last 2 weeks.   . Tell the health care staff about your symptoms: fever, cough and shortness of breath. . After you have been seen by a medical provider, you will be either: o Tested for (COVID-19) and discharged home on quarantine except to seek medical care if symptoms worsen, and asked to  - Stay home and avoid contact with others until you get your results (4-5 days)  - Avoid travel on public transportation if possible (such as bus, train, or airplane) or o Sent to the Emergency Department by EMS for evaluation, COVID-19 testing, and possible admission depending on your condition and test results.  What to do if you are LOW RISK for COVID-19?  Reduce your risk of any infection by using the same precautions used for avoiding the common cold or flu:  Marland Kitchen Wash your hands often with soap and warm water for at least 20 seconds.  If soap and water are not readily available, use an alcohol-based hand sanitizer with at least 60% alcohol.  . If  coughing or sneezing, cover your mouth and nose by coughing or sneezing into the elbow areas of your shirt or coat, into a tissue or into your sleeve (not your hands). . Avoid shaking hands with others and consider head nods or verbal greetings only. . Avoid touching your eyes, nose, or mouth with unwashed hands.  . Avoid close contact with people who are sick. . Avoid places or events with large numbers of people in one location, like concerts or  sporting events. . Carefully consider travel plans you have or are making. . If you are planning any travel outside or inside the [place], visit the CDC's Travelers' Health webpage for the latest health notices. . If you have some symptoms but not all symptoms, continue to monitor at home and seek medical attention if your symptoms worsen. . If you are having a medical emergency, call 911.   [place] / e-Visit: [email or web address]         [facility]: [phone]  [facility]: [phone]                   [facility]: [phone]     Denosumab injection What is this medicine? DENOSUMAB (den oh sue mab) slows bone breakdown. Prolia is used to treat osteoporosis in women after menopause and in men, and in people who are taking corticosteroids for 6 months or more. Delton See is used to treat a high calcium level due to cancer and to prevent bone fractures and other bone problems caused by multiple myeloma or cancer bone metastases. Delton See is also used to treat giant cell tumor of the bone. This medicine may be used for other purposes; ask your health care provider or pharmacist if you have questions. COMMON BRAND NAME(S): Prolia, XGEVA What should I tell my health care provider before I take this medicine? They need to know if you have any of these conditions: -dental disease -having surgery or tooth extraction -infection -kidney disease -low levels of calcium or Vitamin D in the blood -malnutrition -on hemodialysis -skin conditions or sensitivity -thyroid or parathyroid disease -an unusual reaction to denosumab, other medicines, foods, dyes, or preservatives -pregnant or trying to get pregnant -breast-feeding How should I use this medicine? This medicine is for injection under the skin. It is given by a health care professional in a hospital or  clinic setting. A special MedGuide will be given to you before each treatment. Be sure to read this information carefully each time. For Prolia, talk to your pediatrician regarding the use of this medicine in children. Special care may be needed. For Delton See, talk to your pediatrician regarding the use of this medicine in children. While this drug may be prescribed for children as young as 13 years for selected conditions, precautions do apply. Overdosage: If you think you have taken too much of this medicine contact a poison control center or emergency room at once. NOTE: This medicine is only for you. Do not share this medicine with others. What if I miss a dose? It is important not to miss your dose. Call your doctor or health care professional if you are unable to keep an appointment. What may interact with this medicine? Do not take this medicine with any of the following medications: -other medicines containing denosumab This medicine may also interact with the following medications: -medicines that lower your chance of fighting infection -steroid medicines like prednisone or cortisone This list  may not describe all possible interactions. Give your health care provider a list of all the medicines, herbs, non-prescription drugs, or dietary supplements you use. Also tell them if you smoke, drink alcohol, or use illegal drugs. Some items may interact with your medicine. What should I watch for while using this medicine? Visit your doctor or health care professional for regular checks on your progress. Your doctor or health care professional may order blood tests and other tests to see how you are doing. Call your doctor or health care professional for advice if you get a fever, chills or sore throat, or other symptoms of a cold or flu. Do not treat yourself. This drug may decrease your body's ability to fight infection. Try to avoid being around people who are sick. You should make sure you get  enough calcium and vitamin D while you are taking this medicine, unless your doctor tells you not to. Discuss the foods you eat and the vitamins you take with your health care professional. See your dentist regularly. Brush and floss your teeth as directed. Before you have any dental work done, tell your dentist you are receiving this medicine. Do not become pregnant while taking this medicine or for 5 months after stopping it. Talk with your doctor or health care professional about your birth control options while taking this medicine. Women should inform their doctor if they wish to become pregnant or think they might be pregnant. There is a potential for serious side effects to an unborn child. Talk to your health care professional or pharmacist for more information. What side effects may I notice from receiving this medicine? Side effects that you should report to your doctor or health care professional as soon as possible: -allergic reactions like skin rash, itching or hives, swelling of the face, lips, or tongue -bone pain -breathing problems -dizziness -jaw pain, especially after dental work -redness, blistering, peeling of the skin -signs and symptoms of infection like fever or chills; cough; sore throat; pain or trouble passing urine -signs of low calcium like fast heartbeat, muscle cramps or muscle pain; pain, tingling, numbness in the hands or feet; seizures -unusual bleeding or bruising -unusually weak or tired Side effects that usually do not require medical attention (report to your doctor or health care professional if they continue or are bothersome): -constipation -diarrhea -headache -joint pain -loss of appetite -muscle pain -runny nose -tiredness -upset stomach This list may not describe all possible side effects. Call your doctor for medical advice about side effects. You may report side effects to FDA at 1-800-FDA-1088. Where should I keep my medicine? This medicine is  only given in a clinic, doctor's office, or other health care setting and will not be stored at home. NOTE: This sheet is a summary. It may not cover all possible information. If you have questions about this medicine, talk to your doctor, pharmacist, or health care provider.  2019 Elsevier/Gold Standard (2018-01-17 16:10:44)   Pegfilgrastim injection What is this medicine? PEGFILGRASTIM (PEG fil gra stim) is a long-acting granulocyte colony-stimulating factor that stimulates the growth of neutrophils, a type of white blood cell important in the body's fight against infection. It is used to reduce the incidence of fever and infection in patients with certain types of cancer who are receiving chemotherapy that affects the bone marrow, and to increase survival after being exposed to high doses of radiation. This medicine may be used for other purposes; ask your health care provider or pharmacist if you  have questions. COMMON BRAND NAME(S): Domenic Moras, UDENYCA What should I tell my health care provider before I take this medicine? They need to know if you have any of these conditions: -kidney disease -latex allergy -ongoing radiation therapy -sickle cell disease -skin reactions to acrylic adhesives (On-Body Injector only) -an unusual or allergic reaction to pegfilgrastim, filgrastim, other medicines, foods, dyes, or preservatives -pregnant or trying to get pregnant -breast-feeding How should I use this medicine? This medicine is for injection under the skin. If you get this medicine at home, you will be taught how to prepare and give the pre-filled syringe or how to use the On-body Injector. Refer to the patient Instructions for Use for detailed instructions. Use exactly as directed. Tell your healthcare provider immediately if you suspect that the On-body Injector may not have performed as intended or if you suspect the use of the On-body Injector resulted in a missed or partial dose. It  is important that you put your used needles and syringes in a special sharps container. Do not put them in a trash can. If you do not have a sharps container, call your pharmacist or healthcare provider to get one. Talk to your pediatrician regarding the use of this medicine in children. While this drug may be prescribed for selected conditions, precautions do apply. Overdosage: If you think you have taken too much of this medicine contact a poison control center or emergency room at once. NOTE: This medicine is only for you. Do not share this medicine with others. What if I miss a dose? It is important not to miss your dose. Call your doctor or health care professional if you miss your dose. If you miss a dose due to an On-body Injector failure or leakage, a new dose should be administered as soon as possible using a single prefilled syringe for manual use. What may interact with this medicine? Interactions have not been studied. Give your health care provider a list of all the medicines, herbs, non-prescription drugs, or dietary supplements you use. Also tell them if you smoke, drink alcohol, or use illegal drugs. Some items may interact with your medicine. This list may not describe all possible interactions. Give your health care provider a list of all the medicines, herbs, non-prescription drugs, or dietary supplements you use. Also tell them if you smoke, drink alcohol, or use illegal drugs. Some items may interact with your medicine. What should I watch for while using this medicine? You may need blood work done while you are taking this medicine. If you are going to need a MRI, CT scan, or other procedure, tell your doctor that you are using this medicine (On-Body Injector only). What side effects may I notice from receiving this medicine? Side effects that you should report to your doctor or health care professional as soon as possible: -allergic reactions like skin rash, itching or hives,  swelling of the face, lips, or tongue -back pain -dizziness -fever -pain, redness, or irritation at site where injected -pinpoint red spots on the skin -red or dark-brown urine -shortness of breath or breathing problems -stomach or side pain, or pain at the shoulder -swelling -tiredness -trouble passing urine or change in the amount of urine Side effects that usually do not require medical attention (report to your doctor or health care professional if they continue or are bothersome): -bone pain -muscle pain This list may not describe all possible side effects. Call your doctor for medical advice about side effects. You may report  side effects to FDA at 1-800-FDA-1088. Where should I keep my medicine? Keep out of the reach of children. If you are using this medicine at home, you will be instructed on how to store it. Throw away any unused medicine after the expiration date on the label. NOTE: This sheet is a summary. It may not cover all possible information. If you have questions about this medicine, talk to your doctor, pharmacist, or health care provider.  2019 Elsevier/Gold Standard (2017-12-16 16:57:08)

## 2019-01-09 ENCOUNTER — Ambulatory Visit: Payer: Self-pay

## 2019-01-09 ENCOUNTER — Other Ambulatory Visit: Payer: Self-pay

## 2019-01-13 ENCOUNTER — Ambulatory Visit
Admission: RE | Admit: 2019-01-13 | Discharge: 2019-01-13 | Disposition: A | Discharge status: Home / Self Care | Payer: Self-pay | Source: Ambulatory Visit | Attending: Hematology | Admitting: Hematology

## 2019-01-13 ENCOUNTER — Other Ambulatory Visit: Payer: Self-pay

## 2019-01-13 DIAGNOSIS — C3490 Malignant neoplasm of unspecified part of unspecified bronchus or lung: Secondary | ICD-10-CM

## 2019-01-14 NOTE — Progress Notes (Signed)
HEMATOLOGY/ONCOLOGY CLINIC NOTE  Date of Service: 01/15/2019  Patient Care Team: Default, Provider, MD as PCP - General  CHIEF COMPLAINTS/PURPOSE OF CONSULTATION:  Recently Diagnosed Small Cell Lung Cancer  HISTORY OF PRESENTING ILLNESS:  Cody Montoya a 63 y.o.malewith medical history significant ofalcohol abuse.He does not have a primary care provider and does not seek routine medical care. Around 3 weeks ago, the patient started having epigastric abdominal pain. It would wax and wane in severity but progressively worsenedsince onset. He has had associated nausea and vomiting. His appetite has decreased and he is lost around 10 pounds. He stopped drinking when this happened. He would drink around 7-8 shots of vodka daily in addition to the 2-3 beers. He has done this for the past 4-5 years. He does not have any known history of hepatitis B or C and denies taking any medication/supplements routinely. He denies any prominent vessels, yellowing of the skin/eyes, fevers, shortness of breath, or bowel changes. The patient has been using ibuprofen for pain with little relief.  In the emergency room, a CT scan showsmultifocal hepatocellular carcinoma that is not appear to be metastatic to the liver. There is also thoracic adenopathy,omental/peritoneal involvement and left pleural effusion suggestive of metastatic disease. The oncology team was called and recommended admission for work-up.   When seen today, the patient reports that he continues to have abdominal discomfort in his upper abdomen.  Along with the discomfort in his abdomen, he also reports nausea and vomiting and increased constipation.  This is been present for about 2 to 3 weeks.  He states his appetite has been decreased and he has lost weight.  He estimates that he has lost about 10 pounds over the past few months.  Oncology was asked to see the patient to make recommendations regarding his liver  masses.  Interval History:   Cody Montoya is here for follow-up of his extensive stage small cell lung cancer. The patient's last visit with [place] was on 01/05/19. The pt reports that he is doing well overall.  The pt reports that he tolerated his second cycle well overall. He took a few of his anti-nausea medications and endorses controlled nausea. He notes that his back pain has become his "main issue," located between his shoulder blades. He notes that he has needed to use his break through medications every 6 hours. He notes that he has been eating well and denies leg swelling and endorses moving his bowels well.  The pt notes that he has been avoiding public spaces and denies concerns for infections at this time.  Of note since the patient's last visit, pt has had a CT Head completed on 01/13/19 with results revealing "No evidence of intracranial metastatic disease on this unenhanced study. 2. Mild chronic small vessel ischemic disease."  Lab results today (01/15/19) of CBC w/diff and CMP is as follows: all values are WNL except for WBC at 11.3k, RBC at 3.35, HGB at 11.1, HCT at 33.5, RDW at 17.6, ANC at 8.3k, Abs immature granulocytes at 0.10k, BUN at 7, Albumin at 3.4, Alk Phos at 303, Total Bilirubin at 2.2. 01/15/19 TSH is pending 01/15/19 T4 is pending  On review of systems, pt reports stable energy levels, moving his bowels well, controlled nausea, central back pain, and denies concerns for infections, vomiting, diarrhea, leg swelling, abdominal pains, and any other symptoms.    MEDICAL HISTORY:  Past Medical History:  Diagnosis Date   Cancer ([place])  SURGICAL HISTORY: Past Surgical History:  Procedure Laterality Date   NO PAST SURGERIES      SOCIAL HISTORY: Social History   Socioeconomic History   Marital status: Single    Spouse name: Not on file   Number of children: Not on file   Years of education: Not on file   Highest education level: Not on file    Occupational History   Occupation: Surveyor, quantity  Social Needs   Financial resource strain: Not very hard   Food insecurity:    Worry: Never true    Inability: Never true   Transportation needs:    Medical: No    Non-medical: No  Tobacco Use   Smoking status: Current Every Day Smoker    Packs/day: 1.00    Years: 45.00    Pack years: 45.00    Types: Cigarettes  Substance and Sexual Activity   Alcohol use: Yes    Alcohol/week: 8.0 standard drinks    Types: 3 Cans of beer, 5 Shots of liquor per week    Comment: no alcohol in past month due to symptoms   Drug use: Not Currently    Frequency: 7.0 times per week    Types: Marijuana   Sexual activity: Not Currently  Lifestyle   Physical activity:    Days per week: 5 days    Minutes per session: 150+ min   Stress: To some extent  Relationships   Social connections:    Talks on phone: More than three times a week    Gets together: More than three times a week    Attends religious service: Never    Active member of club or organization: No    Attends meetings of clubs or organizations: Never    Relationship status: Living with partner   Intimate partner violence:    Fear of current or ex partner: No    Emotionally abused: No    Physically abused: No    Forced sexual activity: No  Other Topics Concern   Not on file  Social History Narrative   Not on file    FAMILY HISTORY: No family history on file.  ALLERGIES:  has No Known Allergies.  MEDICATIONS:  Current Outpatient Medications  Medication Sig Dispense Refill   docusate sodium (COLACE) 100 MG capsule Take 1 capsule (100 mg total) by mouth 2 (two) times daily. 10 capsule 0   ibuprofen (ADVIL,MOTRIN) 200 MG tablet Take 800 mg by mouth every 6 (six) hours as needed for fever or moderate pain.     ondansetron (ZOFRAN) 8 MG tablet Take 1 tablet (8 mg total) by mouth 2 (two) times daily as needed for refractory nausea / vomiting. Start on day 3  after carboplatin chemo. 30 tablet 1   oxyCODONE (OXY IR/ROXICODONE) 5 MG immediate release tablet Take 1-2 tablets (5-10 mg total) by mouth every 6 (six) hours as needed for moderate pain. 120 tablet 0   oxyCODONE (OXYCONTIN) 10 mg 12 hr tablet Take 1 tablet (10 mg total) by mouth every 12 (twelve) hours for 30 days. For cancer pain 60 tablet 0   polyethylene glycol (MIRALAX / GLYCOLAX) packet Take 17 g by mouth daily as needed for mild constipation. 14 each 0   No current facility-administered medications for this visit.    Facility-Administered Medications Ordered in Other Visits  Medication Dose Route Frequency Provider Last Rate Last Dose   oxyCODONE (Oxy IR/ROXICODONE) immediate release tablet 5 mg  5 mg Oral Once Perla Echavarria Kishore,  MD        REVIEW OF SYSTEMS:    A 10+ POINT REVIEW OF SYSTEMS WAS OBTAINED including neurology, dermatology, psychiatry, cardiac, respiratory, lymph, extremities, GI, GU, Musculoskeletal, constitutional, breasts, reproductive, HEENT.  All pertinent positives are noted in the HPI.  All others are negative.   PHYSICAL EXAMINATION: ECOG PERFORMANCE STATUS: 1-2 Vital signs reviewed in epic  GENERAL:alert, in no acute distress and comfortable SKIN: no acute rashes, no significant lesions EYES: conjunctiva are pink and non-injected, sclera icteric ++ OROPHARYNX: MMM, no exudates, no oropharyngeal erythema or ulceration NECK: supple, no JVD LYMPH:  no palpable lymphadenopathy in the cervical, axillary or inguinal regions LUNGS: clear to auscultation b/l with normal respiratory effort HEART: regular rate & rhythm ABDOMEN: Improved hepatomegaly about 1-2 fingerbreadths below costal line, no TTP and negative Murphy's sign Extremity: trace pedal edema PSYCH: alert & oriented x 3 with fluent speech NEURO: no focal motor/sensory deficits   LABORATORY DATA:  I have reviewed the data as listed  . CBC Latest Ref Rng & Units 01/15/2019 01/05/2019 12/22/2018    WBC 4.0 - 10.5 K/uL 11.3(H) 8.2 17.1(H)  Hemoglobin 13.0 - 17.0 g/dL 11.1(L) 10.6(L) 12.3(L)  Hematocrit 39.0 - 52.0 % 33.5(L) 32.3(L) 36.9(L)  Platelets 150 - 400 K/uL 246 359 145(L)    . CMP Latest Ref Rng & Units 01/15/2019 01/05/2019 12/22/2018  Glucose 70 - 99 mg/dL 97 113(H) 120(H)  BUN 8 - 23 mg/dL 7(L) 10 21  Creatinine 0.61 - 1.24 mg/dL 0.70 0.69 0.80  Sodium 135 - 145 mmol/L 136 137 135  Potassium 3.5 - 5.1 mmol/L 4.5 4.0 4.5  Chloride 98 - 111 mmol/L 102 104 99  CO2 22 - 32 mmol/L _0 Calcium 8.9 - 10.3 mg/dL 9.2 9.0 9.0  Total Protein 6.5 - 8.1 g/dL 7.8 7.1 7.1  Total Bilirubin 0.3 - 1.2 mg/dL 2.2(H) 3.2(H) 9.4(HH)  Alkaline Phos 38 - 126 U/L 303(H) 298(H) 961(H)  AST 15 - 41 U/L 19 24 113(H)  ALT 0 - 44 U/L 18 20 121(H)       RADIOGRAPHIC STUDIES: I have personally reviewed the radiological images as listed and agreed with the findings in the report. Ct Head Wo Contrast  Result Date: 01/13/2019 CLINICAL DATA:  Small-cell lung cancer staging. Unable to tolerate MRI. Examination requested without IV contrast due to risk of hepatorenal syndrome. EXAM: CT HEAD WITHOUT CONTRAST TECHNIQUE: Contiguous axial images were obtained from the base of the skull through the vertex without intravenous contrast. COMPARISON:  None. FINDINGS: Brain: No acute infarct, intracranial hemorrhage, intracranial mass effect, or extra-axial fluid collection is identified. No mass is identified on this unenhanced examination, and no significant vasogenic edema is evident. Very mild cerebral atrophy is noted in the parietal regions. Mild scattered cerebral white matter hypodensities are nonspecific but compatible with mild chronic small vessel ischemic disease. Vascular: Calcified atherosclerosis at the skull base. No hyperdense vessel. Skull: No fracture or focal osseous lesion. Sinuses/Orbits: Small mucous retention cyst or polyp in the left maxillary sinus. Clear mastoid air cells. Unremarkable  orbits. Other: None. IMPRESSION: 1. No evidence of intracranial metastatic disease on this unenhanced study. 2. Mild chronic small vessel ischemic disease. Electronically Signed   By: Logan Bores M.D.   On: 01/13/2019 08:24   Nm Hepatobiliary Including Gb  Result Date: 01/02/2019 CLINICAL DATA:  Evaluate for a calculus cholecystitis. No right upper quadrant pain. EXAM: NUCLEAR MEDICINE HEPATOBILIARY IMAGING TECHNIQUE: Sequential images of the abdomen were obtained  out to 60 minutes following intravenous administration of radiopharmaceutical. RADIOPHARMACEUTICALS:  7.2 mCi Tc-63m Choletec IV COMPARISON:  None. FINDINGS: Prompt uptake and biliary excretion of activity by the liver is seen. Gallbladder activity is visualized, consistent with patency of cystic duct. Biliary activity passes into small bowel, consistent with patent common bile duct. IMPRESSION: 1. No biliary obstruction. 2. Normal hepatobiliary scan. Electronically Signed   By: HKathreen Devoid  On: 01/02/2019 09:19   [place] Limited  Result Date: 12/18/2018 CLINICAL DATA:  Liver metastases. Elevated liver enzymes. Lung carcinoma primary EXAM: ULTRASOUND ABDOMEN LIMITED RIGHT UPPER QUADRANT COMPARISON:  November 29, 2018 CT abdomen and pelvis FINDINGS: Gallbladder: No gallstones are evident. Sludge is noted in the gallbladder. The gallbladder wall is thickened and mildly edematous. There is no pericholecystic fluid. No sonographic Murphy sign noted by sonographer. Common bile duct: Diameter: 9 mm which is prominent. No mass or calculus is seen in the biliary ductal system. Liver: Liver contour is somewhat nodular. Overall, the liver echogenicity is increased. There is diffuse inhomogeneity throughout the liver consistent with widespread metastatic disease, noted on recent CT. Focal mass in the left lobe measures 3.2 x 2.2 x 2.4 cm. Portal vein is patent on color Doppler imaging with normal direction of blood flow towards the liver. IMPRESSION: 1.  Widespread metastatic disease throughout the liver. The liver has a somewhat nodular contour suggesting underlying cirrhosis. 2. No gallstones seen. Gallbladder wall appears mildly thickened and edematous. Sludge is noted in the gallbladder. There is also prominence of the common bile duct without mass or calculus evident. These findings raise concern for potential degree of acalculus cholecystitis. In this regard, it may be prudent to consider nuclear medicine hepatobiliary imaging study to assess for cystic duct patency. These results will be called to the ordering clinician or representative by the Radiologist Assistant, and communication documented in the PACS or zVision Dashboard. Electronically Signed   By: WLowella GripIII M.D.   On: 12/18/2018 09:53    ASSESSMENT & PLAN:   63y.o. male with  1. Recently diagnosed Extensive Stage Small Cell Carcinoma   2.  Abnormal liver function tests.  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  Bump in bilirubin could be from progression of his liver mets.   12/18/18 [place] revealed Widespread metastatic disease throughout the liver. The liver has a somewhat nodular contour suggesting underlying cirrhosis. 2. No gallstones seen. Gallbladder wall appears mildly thickened and edematous. Sludge is noted in the gallbladder. There is also prominence of the common bile duct without mass or calculus evident. These findings raise concern for potential degree of acalculus cholecystitis. In this regard, it may be prudent to consider nuclear medicine hepatobiliary imaging study to assess for cystic duct patency.  01/02/19 NM Hepatobiliary including GB revealed No biliary obstruction. 2. Normal hepatobiliary scan.  PLAN: -Discussed pt labwork today, 01/15/19; WBC at 11.3k, HGB holding, PLT normal at 246k. Transaminases normal. Bilirubin improved to 2.2. Alk Phos stable at 303. -Discussed the 01/13/19 CT Head which  revealed "No evidence of intracranial metastatic disease on this unenhanced study. 2. Mild chronic small vessel ischemic disease." -Will plan to repeat PET/CT after completing C4 -The pt has no prohibitive toxicities from C2 Carboplatin AUC4, 537mm2 Etoposide over 3 days, and Atezolizumab at this time. -XDelton Seevery 6 weeks for bone strengthening, denies dental pains, discussed risk of non-healing ulcers -Discussed that we can consider referral to [place] consideration of  RT to T10 for back pain. -Discussed pain management strategies again with the pt: 1-2 short acting Oxycodone every 4-6 hours as needed. Continue long acting Oxycontin. -Will refer the pt to our nutritional therapist Ernestene Kiel -Pt did receive C1 Carboplatin AUC 3 and Etoposide 80 mg/m only day 1 to determine tolerability of treatment initially. Did hold off on day 2 and day 3 of etoposide treatment for Cycle 1. Also held off on atezolizumab with cycle 1. -After confirming with patient, a palliative care referral has been requested given the patient might have worsening symptom burden if he does not tolerate chemotherapy. -Recommend eating soft foods -Will see the pt back with C3 on 01/26/19   RTC for continued treatment as per appointments for C3 and C4 as currently scheduled   All of the patients questions were answered with apparent satisfaction. The patient knows to call the clinic with any problems, questions or concerns.  The total time spent in the appt was 25 minutes and more than 50% was on counseling and direct patient cares.     Sullivan Lone MD [place] AAHIVMS [facility] Hematology/Oncology Physician [facility]: [place] [address]onda W  (Office):       [phone] (Work cell):  [phone] (Fax):           [phone]  I, Baldwin [place], am acting as a scribe for Dr. Sullivan Lone.   .I have reviewed the above documentation for accuracy and completeness, and I agree with the above. Brunetta Genera MD

## 2019-01-15 ENCOUNTER — Telehealth: Payer: Self-pay | Admitting: Hematology

## 2019-01-15 ENCOUNTER — Other Ambulatory Visit: Payer: Self-pay

## 2019-01-15 ENCOUNTER — Inpatient Hospital Stay: Payer: Self-pay

## 2019-01-15 ENCOUNTER — Inpatient Hospital Stay (HOSPITAL_BASED_OUTPATIENT_CLINIC_OR_DEPARTMENT_OTHER): Payer: Self-pay | Admitting: Hematology

## 2019-01-15 VITALS — BP 122/74 | HR 63 | Temp 98.0°F | Resp 18 | Ht 70.0 in | Wt 175.1 lb

## 2019-01-15 DIAGNOSIS — R748 Abnormal levels of other serum enzymes: Secondary | ICD-10-CM

## 2019-01-15 DIAGNOSIS — G893 Neoplasm related pain (acute) (chronic): Secondary | ICD-10-CM

## 2019-01-15 DIAGNOSIS — R110 Nausea: Secondary | ICD-10-CM

## 2019-01-15 DIAGNOSIS — C7951 Secondary malignant neoplasm of bone: Secondary | ICD-10-CM

## 2019-01-15 DIAGNOSIS — Z7189 Other specified counseling: Secondary | ICD-10-CM

## 2019-01-15 DIAGNOSIS — C3490 Malignant neoplasm of unspecified part of unspecified bronchus or lung: Secondary | ICD-10-CM

## 2019-01-15 DIAGNOSIS — Z720 Tobacco use: Secondary | ICD-10-CM

## 2019-01-15 DIAGNOSIS — C787 Secondary malignant neoplasm of liver and intrahepatic bile duct: Secondary | ICD-10-CM

## 2019-01-15 DIAGNOSIS — M545 Low back pain: Secondary | ICD-10-CM

## 2019-01-15 LAB — CMP (CANCER CENTER ONLY)
ALT: 18 U/L (ref 0–44)
AST: 19 U/L (ref 15–41)
Albumin: 3.4 g/dL — ABNORMAL LOW (ref 3.5–5.0)
Alkaline Phosphatase: 303 U/L — ABNORMAL HIGH (ref 38–126)
Anion gap: 9 (ref 5–15)
BUN: 7 mg/dL — ABNORMAL LOW (ref 8–23)
CO2: 25 mmol/L (ref 22–32)
Calcium: 9.2 mg/dL (ref 8.9–10.3)
Chloride: 102 mmol/L (ref 98–111)
Creatinine: 0.7 mg/dL (ref 0.61–1.24)
GFR, Est AFR Am: >60 mL/min (ref >60)
GFR, Estimated: >60 mL/min (ref >60)
Glucose, Bld: 97 mg/dL (ref 70–99)
Potassium: 4.5 mmol/L (ref 3.5–5.1)
Sodium: 136 mmol/L (ref 135–145)
Total Bilirubin: 2.2 mg/dL — ABNORMAL HIGH (ref 0.3–1.2)
Total Protein: 7.8 g/dL (ref 6.5–8.1)

## 2019-01-15 LAB — CBC WITH DIFFERENTIAL/PLATELET
Abs Immature Granulocytes: 0.1 10*3/uL — ABNORMAL HIGH (ref 0.00–0.07)
Basophils Absolute: 0.1 10*3/uL (ref 0.0–0.1)
Basophils Relative: 1 %
Eosinophils Absolute: 0.1 10*3/uL (ref 0.0–0.5)
Eosinophils Relative: 1 %
HCT: 33.5 % — ABNORMAL LOW (ref 39.0–52.0)
Hemoglobin: 11.1 g/dL — ABNORMAL LOW (ref 13.0–17.0)
Immature Granulocytes: 1 %
Lymphocytes Relative: 15 %
Lymphs Abs: 1.7 10*3/uL (ref 0.7–4.0)
MCH: 33.1 pg (ref 26.0–34.0)
MCHC: 33.1 g/dL (ref 30.0–36.0)
MCV: 100 fL (ref 80.0–100.0)
Monocytes Absolute: 1 10*3/uL (ref 0.1–1.0)
Monocytes Relative: 9 %
Neutro Abs: 8.3 10*3/uL — ABNORMAL HIGH (ref 1.7–7.7)
Neutrophils Relative %: 73 %
Platelets: 246 10*3/uL (ref 150–400)
RBC: 3.35 MIL/uL — ABNORMAL LOW (ref 4.22–5.81)
RDW: 17.6 % — ABNORMAL HIGH (ref 11.5–15.5)
WBC: 11.3 10*3/uL — ABNORMAL HIGH (ref 4.0–10.5)
nRBC: 0 % (ref 0.0–0.2)

## 2019-01-15 LAB — TSH: TSH: 1.532 u[IU]/mL (ref 0.320–4.118)

## 2019-01-15 LAB — SAMPLE TO BLOOD BANK

## 2019-01-15 NOTE — Telephone Encounter (Signed)
Per 4/22 appts already scheduled.

## 2019-01-16 LAB — T4: T4, Total: 9 ug/dL (ref 4.5–12.0)

## 2019-01-23 NOTE — Progress Notes (Signed)
HEMATOLOGY/ONCOLOGY CLINIC NOTE  Date of Service: 01/26/2019  Patient Care Team: Default, Provider, MD as PCP - General  CHIEF COMPLAINTS/PURPOSE OF CONSULTATION:  Recently Diagnosed Small Cell Lung Cancer  HISTORY OF PRESENTING ILLNESS:  Cody Montoya a 63 y.o.malewith medical history significant ofalcohol abuse.He does not have a primary care provider and does not seek routine medical care. Around 3 weeks ago, the patient started having epigastric abdominal pain. It would wax and wane in severity but progressively worsenedsince onset. He has had associated nausea and vomiting. His appetite has decreased and he is lost around 10 pounds. He stopped drinking when this happened. He would drink around 7-8 shots of vodka daily in addition to the 2-3 beers. He has done this for the past 4-5 years. He does not have any known history of hepatitis B or C and denies taking any medication/supplements routinely. He denies any prominent vessels, yellowing of the skin/eyes, fevers, shortness of breath, or bowel changes. The patient has been using ibuprofen for pain with little relief.  In the emergency room, a CT scan showsmultifocal hepatocellular carcinoma that is not appear to be metastatic to the liver. There is also thoracic adenopathy,omental/peritoneal involvement and left pleural effusion suggestive of metastatic disease. The oncology team was called and recommended admission for work-up.   When seen today, the patient reports that he continues to have abdominal discomfort in his upper abdomen.  Along with the discomfort in his abdomen, he also reports nausea and vomiting and increased constipation.  This is been present for about 2 to 3 weeks.  He states his appetite has been decreased and he has lost weight.  He estimates that he has lost about 10 pounds over the past few months.  Oncology was asked to see the patient to make recommendations regarding his liver  masses.  Interval History:   Cody Montoya is here for follow-up of his extensive stage small cell lung cancer. The patient's last visit with [place] was on 01/15/19. The pt reports that he is doing well overall.  The pt reports that he has been eating better and has gained 4 pounds in the interim. The pt notes that he is not having as much abdominal fullness. He denies problems swallowing. He notes that his back pain is mostly well controlled and notes that it does not limit him from walking and moving around. The pt notes that he was able to get out on his boat this weekend and endorses good energy levels. He denies any diarrhea or skin rashes and endorses that he has been able to tolerate the treatment well thus far.  Lab results today (01/26/19) of CBC w/diff and CMP is as follows: all values are WNL except for RBC at 3.36, HGB at 11.2, HCT at 33.8, MCV at 100.6, RDW at 16.8, Glucose at 116, Calcium at 8.5, Albumin at 3.1, Alk Phos at 149, Total Bilirubin at 1.3. 01/26/19 Magnesium at 1.7 01/26/19 TSH is pending  On review of systems, pt reports eating better, weight gain, good energy levels, staying hydrated, breathing well, and denies diarrhea, skin rashes, concerns for infections, leg swelling, abdominal pains, and any other symptoms.   MEDICAL HISTORY:  Past Medical History:  Diagnosis Date   Cancer [facility])     SURGICAL HISTORY: Past Surgical History:  Procedure Laterality Date   NO PAST SURGERIES      SOCIAL HISTORY: Social History   Socioeconomic History   Marital status: Single    Spouse name:  Not on file   Number of children: Not on file   Years of education: Not on file   Highest education level: Not on file  Occupational History   Occupation: Surveyor, quantity  Social Needs   Financial resource strain: Not very hard   Food insecurity:    Worry: Never true    Inability: Never true   Transportation needs:    Medical: No    Non-medical: No  Tobacco Use     Smoking status: Current Every Day Smoker    Packs/day: 1.00    Years: 45.00    Pack years: 45.00    Types: Cigarettes  Substance and Sexual Activity   Alcohol use: Yes    Alcohol/week: 8.0 standard drinks    Types: 3 Cans of beer, 5 Shots of liquor per week    Comment: no alcohol in past month due to symptoms   Drug use: Not Currently    Frequency: 7.0 times per week    Types: Marijuana   Sexual activity: Not Currently  Lifestyle   Physical activity:    Days per week: 5 days    Minutes per session: 150+ min   Stress: To some extent  Relationships   Social connections:    Talks on phone: More than three times a week    Gets together: More than three times a week    Attends religious service: Never    Active member of club or organization: No    Attends meetings of clubs or organizations: Never    Relationship status: Living with partner   Intimate partner violence:    Fear of current or ex partner: No    Emotionally abused: No    Physically abused: No    Forced sexual activity: No  Other Topics Concern   Not on file  Social History Narrative   Not on file    FAMILY HISTORY: No family history on file.  ALLERGIES:  has No Known Allergies.  MEDICATIONS:  Current Outpatient Medications  Medication Sig Dispense Refill   docusate sodium (COLACE) 100 MG capsule Take 1 capsule (100 mg total) by mouth 2 (two) times daily. 10 capsule 0   ibuprofen (ADVIL,MOTRIN) 200 MG tablet Take 800 mg by mouth every 6 (six) hours as needed for fever or moderate pain.     ondansetron (ZOFRAN) 8 MG tablet Take 1 tablet (8 mg total) by mouth 2 (two) times daily as needed for refractory nausea / vomiting. Start on day 3 after carboplatin chemo. 30 tablet 1   oxyCODONE (OXY IR/ROXICODONE) 5 MG immediate release tablet Take 1-2 tablets (5-10 mg total) by mouth every 6 (six) hours as needed for moderate pain. 120 tablet 0   polyethylene glycol (MIRALAX / GLYCOLAX) packet Take 17 g  by mouth daily as needed for mild constipation. 14 each 0   No current facility-administered medications for this visit.    Facility-Administered Medications Ordered in Other Visits  Medication Dose Route Frequency Provider Last Rate Last Dose   oxyCODONE (Oxy IR/ROXICODONE) immediate release tablet 5 mg  5 mg Oral Once Brunetta Genera, MD        REVIEW OF SYSTEMS:    A 10+ POINT REVIEW OF SYSTEMS WAS OBTAINED including neurology, dermatology, psychiatry, cardiac, respiratory, lymph, extremities, GI, GU, Musculoskeletal, constitutional, breasts, reproductive, HEENT.  All pertinent positives are noted in the HPI.  All others are negative.   PHYSICAL EXAMINATION: ECOG PERFORMANCE STATUS: 1-2 Vital signs reviewed in epic  GENERAL:alert, in  no acute distress and comfortable SKIN: no acute rashes, no significant lesions EYES: conjunctiva are pink and non-injected, sclera anicteric OROPHARYNX: MMM, no exudates, no oropharyngeal erythema or ulceration NECK: supple, no JVD LYMPH:  no palpable lymphadenopathy in the cervical, axillary or inguinal regions LUNGS: clear to auscultation b/l with normal respiratory effort HEART: regular rate & rhythm ABDOMEN: Improved hepatomegaly about 1-2 fingerbreadths below costal line, no TTP and negative Murphy's sign Extremity: trace pedal edema PSYCH: alert & oriented x 3 with fluent speech NEURO: no focal motor/sensory deficits    LABORATORY DATA:  I have reviewed the data as listed  . CBC Latest Ref Rng & Units 01/26/2019 01/15/2019 01/05/2019  WBC 4.0 - 10.5 K/uL 7.8 11.3(H) 8.2  Hemoglobin 13.0 - 17.0 g/dL 11.2(L) 11.1(L) 10.6(L)  Hematocrit 39.0 - 52.0 % 33.8(L) 33.5(L) 32.3(L)  Platelets 150 - 400 K/uL 293 246 359    . CMP Latest Ref Rng & Units 01/26/2019 01/15/2019 01/05/2019  Glucose 70 - 99 mg/dL 116(H) 97 113(H)  BUN 8 - 23 mg/dL 10 7(L) 10  Creatinine 0.61 - 1.24 mg/dL 0.66 0.70 0.69  Sodium 135 - 145 mmol/L 136 136 137  Potassium  3.5 - 5.1 mmol/L 4.1 4.5 4.0  Chloride 98 - 111 mmol/L 104 102 104  CO2 22 - 32 mmol/L _0 Calcium 8.9 - 10.3 mg/dL 8.5(L) 9.2 9.0  Total Protein 6.5 - 8.1 g/dL 7.2 7.8 7.1  Total Bilirubin 0.3 - 1.2 mg/dL 1.3(H) 2.2(H) 3.2(H)  Alkaline Phos 38 - 126 U/L 149(H) 303(H) 298(H)  AST 15 - 41 U/L _1 ALT 0 - 44 U/L _2 RADIOGRAPHIC STUDIES: I have personally reviewed the radiological images as listed and agreed with the findings in the report. Ct Head Wo Contrast  Result Date: 01/13/2019 CLINICAL DATA:  Small-cell lung cancer staging. Unable to tolerate MRI. Examination requested without IV contrast due to risk of hepatorenal syndrome. EXAM: CT HEAD WITHOUT CONTRAST TECHNIQUE: Contiguous axial images were obtained from the base of the skull through the vertex without intravenous contrast. COMPARISON:  None. FINDINGS: Brain: No acute infarct, intracranial hemorrhage, intracranial mass effect, or extra-axial fluid collection is identified. No mass is identified on this unenhanced examination, and no significant vasogenic edema is evident. Very mild cerebral atrophy is noted in the parietal regions. Mild scattered cerebral white matter hypodensities are nonspecific but compatible with mild chronic small vessel ischemic disease. Vascular: Calcified atherosclerosis at the skull base. No hyperdense vessel. Skull: No fracture or focal osseous lesion. Sinuses/Orbits: Small mucous retention cyst or polyp in the left maxillary sinus. Clear mastoid air cells. Unremarkable orbits. Other: None. IMPRESSION: 1. No evidence of intracranial metastatic disease on this unenhanced study. 2. Mild chronic small vessel ischemic disease. Electronically Signed   By: Logan Bores M.D.   On: 01/13/2019 08:24   Nm Hepatobiliary Including Gb  Result Date: 01/02/2019 CLINICAL DATA:  Evaluate for a calculus cholecystitis. No right upper quadrant pain. EXAM: NUCLEAR MEDICINE HEPATOBILIARY IMAGING TECHNIQUE:  Sequential images of the abdomen were obtained out to 60 minutes following intravenous administration of radiopharmaceutical. RADIOPHARMACEUTICALS:  7.2 mCi Tc-95m Choletec IV COMPARISON:  None. FINDINGS: Prompt uptake and biliary excretion of activity by the liver is seen. Gallbladder activity is visualized, consistent with patency of cystic duct. Biliary activity passes into small bowel, consistent with patent common bile duct. IMPRESSION: 1. No biliary obstruction. 2. Normal hepatobiliary scan. Electronically Signed  By: Kathreen Devoid   On: 01/02/2019 09:19    ASSESSMENT & PLAN:   63 y.o. male with  1. 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 2. Mild chronic small vessel ischemic disease."   2.  Abnormal liver function tests.  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  Bump in bilirubin could be from progression of his liver mets.   12/18/18 US Abdomen revealed Widespread metastatic disease throughout the liver. The liver has a somewhat nodular contour suggesting underlying cirrhosis. 2. No gallstones seen. Gallbladder wall appears mildly thickened and edematous. Sludge is noted in the gallbladder. There is also prominence of the common bile duct without mass or calculus evident. These findings raise concern for potential degree of acalculus cholecystitis. In this regard, it may be prudent to consider nuclear medicine hepatobiliary imaging study to assess for cystic duct patency.  01/02/19 NM Hepatobiliary including GB revealed No biliary obstruction. 2. Normal hepatobiliary scan.  PLAN: -Discussed pt labwork today, 01/26/19; blood counts holding well, Alk phos improved to 149 and Bilirubin near normalized. Transaminases normalized. -The pt has no prohibitive toxicities from continuing C3 Carboplatin AUC4, 74m/m2 Etoposide, and  Atezolizumab at this time. -Will consider dose increase after C3 if pt tolerates this well -Will plan to repeat PET/CT after completing C4 -Continue Xgeva every 4 weeks for bone strengthening, denies dental pains, discussed risk of non-healing ulcers -Discussed that we can consider [place] to [place]10 for back pain. -Discussed pain management strategies again with the pt: 1-2 short acting Oxycodone every 4-6 hours as needed. Continue long acting Oxycontin. -Did refer the pt to our nutritional therapist BErnestene Kiel-Pt did receive C1 Carboplatin AUC 3 and Etoposide 80 mg/m only day 1 to determine tolerability of treatment initially. Did hold off on day 2 and day 3 of etoposide treatment for Cycle 1. Also held off on atezolizumab with cycle 1 -After confirming with patient, a palliative care referral has been requested given the patient might have worsening symptom burden if he does not tolerate chemotherapy. -Recommended that the pt continue to eat well, drink at least 48-64 oz of water each day, and walk 20-30 minutes each day. -Will see the pt back in 3 weeks   Please schedule C4 and C5 of chemotherapy with labs and MD visit Continue XMarchelle Folks  All of the patients questions were answered with apparent satisfaction. The patient knows to call the clinic with any problems, questions or concerns.  The total time spent in the appt was 25 minutes and more than 50% was on counseling and direct patient cares.    GSullivan LoneMD MHoehneAAHIVMS SOhio State University Hospital EastCSummerville Medical CenterHematology/Oncology Physician CRegional Hospital [facility] Sc[facility] (Office):       [phone](Work cell):  [phone](Fax):           [phone] I, SBaldwin Jamaica am acting as a scribe for Dr. GSullivan Lone   .I have reviewed the above documentation for accuracy and completeness, and I agree with the above. .Brunetta GeneraMD

## 2019-01-26 ENCOUNTER — Telehealth: Payer: Self-pay | Admitting: Hematology

## 2019-01-26 ENCOUNTER — Inpatient Hospital Stay: Payer: Self-pay

## 2019-01-26 ENCOUNTER — Other Ambulatory Visit: Payer: Self-pay

## 2019-01-26 ENCOUNTER — Inpatient Hospital Stay: Payer: Self-pay | Attending: Hematology

## 2019-01-26 ENCOUNTER — Inpatient Hospital Stay (HOSPITAL_BASED_OUTPATIENT_CLINIC_OR_DEPARTMENT_OTHER): Payer: Self-pay | Admitting: Hematology

## 2019-01-26 VITALS — BP 118/65 | HR 59 | Temp 98.0°F | Resp 18 | Ht 70.0 in | Wt 179.4 lb

## 2019-01-26 DIAGNOSIS — Z5112 Encounter for antineoplastic immunotherapy: Secondary | ICD-10-CM | POA: Insufficient documentation

## 2019-01-26 DIAGNOSIS — C3490 Malignant neoplasm of unspecified part of unspecified bronchus or lung: Secondary | ICD-10-CM | POA: Insufficient documentation

## 2019-01-26 DIAGNOSIS — Z5189 Encounter for other specified aftercare: Secondary | ICD-10-CM | POA: Insufficient documentation

## 2019-01-26 DIAGNOSIS — G893 Neoplasm related pain (acute) (chronic): Secondary | ICD-10-CM

## 2019-01-26 DIAGNOSIS — C7951 Secondary malignant neoplasm of bone: Secondary | ICD-10-CM

## 2019-01-26 DIAGNOSIS — C787 Secondary malignant neoplasm of liver and intrahepatic bile duct: Secondary | ICD-10-CM | POA: Insufficient documentation

## 2019-01-26 DIAGNOSIS — Z5111 Encounter for antineoplastic chemotherapy: Secondary | ICD-10-CM

## 2019-01-26 DIAGNOSIS — Z7189 Other specified counseling: Secondary | ICD-10-CM

## 2019-01-26 DIAGNOSIS — M545 Low back pain: Secondary | ICD-10-CM

## 2019-01-26 DIAGNOSIS — Z720 Tobacco use: Secondary | ICD-10-CM

## 2019-01-26 LAB — CMP (CANCER CENTER ONLY)
ALT: 12 U/L (ref 0–44)
AST: 15 U/L (ref 15–41)
Albumin: 3.1 g/dL — ABNORMAL LOW (ref 3.5–5.0)
Alkaline Phosphatase: 149 U/L — ABNORMAL HIGH (ref 38–126)
Anion gap: 9 (ref 5–15)
BUN: 10 mg/dL (ref 8–23)
CO2: 23 mmol/L (ref 22–32)
Calcium: 8.5 mg/dL — ABNORMAL LOW (ref 8.9–10.3)
Chloride: 104 mmol/L (ref 98–111)
Creatinine: 0.66 mg/dL (ref 0.61–1.24)
GFR, Est AFR Am: >60 mL/min (ref >60)
GFR, Estimated: >60 mL/min (ref >60)
Glucose, Bld: 116 mg/dL — ABNORMAL HIGH (ref 70–99)
Potassium: 4.1 mmol/L (ref 3.5–5.1)
Sodium: 136 mmol/L (ref 135–145)
Total Bilirubin: 1.3 mg/dL — ABNORMAL HIGH (ref 0.3–1.2)
Total Protein: 7.2 g/dL (ref 6.5–8.1)

## 2019-01-26 LAB — CBC WITH DIFFERENTIAL/PLATELET
Abs Immature Granulocytes: 0.03 10*3/uL (ref 0.00–0.07)
Basophils Absolute: 0 10*3/uL (ref 0.0–0.1)
Basophils Relative: 0 %
Eosinophils Absolute: 0 10*3/uL (ref 0.0–0.5)
Eosinophils Relative: 0 %
HCT: 33.8 % — ABNORMAL LOW (ref 39.0–52.0)
Hemoglobin: 11.2 g/dL — ABNORMAL LOW (ref 13.0–17.0)
Immature Granulocytes: 0 %
Lymphocytes Relative: 18 %
Lymphs Abs: 1.4 10*3/uL (ref 0.7–4.0)
MCH: 33.3 pg (ref 26.0–34.0)
MCHC: 33.1 g/dL (ref 30.0–36.0)
MCV: 100.6 fL — ABNORMAL HIGH (ref 80.0–100.0)
Monocytes Absolute: 0.6 10*3/uL (ref 0.1–1.0)
Monocytes Relative: 8 %
Neutro Abs: 5.7 10*3/uL (ref 1.7–7.7)
Neutrophils Relative %: 74 %
Platelets: 293 10*3/uL (ref 150–400)
RBC: 3.36 MIL/uL — ABNORMAL LOW (ref 4.22–5.81)
RDW: 16.8 % — ABNORMAL HIGH (ref 11.5–15.5)
WBC: 7.8 10*3/uL (ref 4.0–10.5)
nRBC: 0 % (ref 0.0–0.2)

## 2019-01-26 LAB — TSH: TSH: 0.875 u[IU]/mL (ref 0.320–4.118)

## 2019-01-26 LAB — MAGNESIUM: Magnesium: 1.7 mg/dL (ref 1.7–2.4)

## 2019-01-26 MED ORDER — SODIUM CHLORIDE 0.9 % IV SOLN
Freq: Once | INTRAVENOUS | Status: AC
  Administered 2019-01-26: 12:00:00 via INTRAVENOUS
  Filled 2019-01-26: qty 5

## 2019-01-26 MED ORDER — SODIUM CHLORIDE 0.9 % IV SOLN
Freq: Once | INTRAVENOUS | Status: AC
  Administered 2019-01-26: 12:00:00 via INTRAVENOUS
  Filled 2019-01-26: qty 250

## 2019-01-26 MED ORDER — SODIUM CHLORIDE 0.9 % IV SOLN
550.0000 mg | Freq: Once | INTRAVENOUS | Status: AC
  Administered 2019-01-26: 550 mg via INTRAVENOUS
  Filled 2019-01-26: qty 55

## 2019-01-26 MED ORDER — SODIUM CHLORIDE 0.9% FLUSH
10.0000 mL | INTRAVENOUS | Status: DC | PRN
  Filled 2019-01-26: qty 10

## 2019-01-26 MED ORDER — OXYCODONE HCL ER 10 MG PO T12A: 10.0000 mg | EXTENDED_RELEASE_TABLET | Freq: Two times a day (BID) | ORAL | 0 refills | Status: DC

## 2019-01-26 MED ORDER — PALONOSETRON HCL INJECTION 0.25 MG/5ML
INTRAVENOUS | Status: AC
  Filled 2019-01-26: qty 5

## 2019-01-26 MED ORDER — HEPARIN SOD (PORK) LOCK FLUSH 100 UNIT/ML IV SOLN
500.0000 [IU] | Freq: Once | INTRAVENOUS | Status: DC | PRN
  Filled 2019-01-26: qty 5

## 2019-01-26 MED ORDER — OXYCODONE HCL 5 MG PO TABS: 5.0000 mg | ORAL_TABLET | Freq: Four times a day (QID) | ORAL | 0 refills | Status: DC | PRN

## 2019-01-26 MED ORDER — PALONOSETRON HCL INJECTION 0.25 MG/5ML
0.2500 mg | Freq: Once | INTRAVENOUS | Status: AC
  Administered 2019-01-26: 0.25 mg via INTRAVENOUS

## 2019-01-26 MED ORDER — SODIUM CHLORIDE 0.9 % IV SOLN
50.0000 mg/m2 | Freq: Once | INTRAVENOUS | Status: AC
  Administered 2019-01-26: 15:00:00 110 mg via INTRAVENOUS
  Filled 2019-01-26: qty 5.5

## 2019-01-26 MED ORDER — SODIUM CHLORIDE 0.9 % IV SOLN
1200.0000 mg | Freq: Once | INTRAVENOUS | Status: AC
  Administered 2019-01-26: 1200 mg via INTRAVENOUS
  Filled 2019-01-26: qty 20

## 2019-01-26 NOTE — Patient Instructions (Signed)
[place] Discharge Instructions for Patients Receiving Chemotherapy  Today you received the following chemotherapy agents Atezolizumab (TECENTRIQ), Carboplatin (PARAPLATIN) & Etoposide (VEPESID).  To help prevent nausea and vomiting after your treatment, we encourage you to take your nausea medication as prescribed.   If you develop nausea and vomiting that is not controlled by your nausea medication, call the clinic.   BELOW ARE SYMPTOMS THAT SHOULD BE REPORTED IMMEDIATELY:  *FEVER GREATER THAN 100.5 F  *CHILLS WITH OR WITHOUT FEVER  NAUSEA AND VOMITING THAT IS NOT CONTROLLED WITH YOUR NAUSEA MEDICATION  *UNUSUAL SHORTNESS OF BREATH  *UNUSUAL BRUISING OR BLEEDING  TENDERNESS IN MOUTH AND THROAT WITH OR WITHOUT PRESENCE OF ULCERS  *URINARY PROBLEMS  *BOWEL PROBLEMS  UNUSUAL RASH Items with * indicate a potential emergency and should be followed up as soon as possible.  Feel free to call the clinic should you have any questions or concerns. The clinic phone number is [phone].  Please show the [place] at check-in to the Emergency Department and triage nurse.  Atezolizumab injection What is this medicine? ATEZOLIZUMAB (a te zoe LIZ ue mab) is a monoclonal antibody. It is used to treat bladder cancer (urothelial cancer), non-small cell lung cancer, small cell lung cancer, and breast cancer. This medicine may be used for other purposes; ask your health care provider or pharmacist if you have questions. COMMON BRAND NAME(S): Tecentriq What should I tell my health care provider before I take this medicine? They need to know if you have any of these conditions: -diabetes -immune system problems -infection -inflammatory bowel disease -liver disease -lung or breathing disease -lupus -nervous system problems like myasthenia gravis or Guillain-Barre syndrome -organ transplant -an unusual or allergic reaction to atezolizumab, other medicines,  foods, dyes, or preservatives -pregnant or trying to get pregnant -breast-feeding How should I use this medicine? This medicine is for infusion into a vein. It is given by a health care professional in a hospital or clinic setting. A special MedGuide will be given to you before each treatment. Be sure to read this information carefully each time. Talk to your pediatrician regarding the use of this medicine in children. Special care may be needed. Overdosage: If you think you have taken too much of this medicine contact a poison control center or emergency room at once. NOTE: This medicine is only for you. Do not share this medicine with others. What if I miss a dose? It is important not to miss your dose. Call your doctor or health care professional if you are unable to keep an appointment. What may interact with this medicine? Interactions have not been studied. This list may not describe all possible interactions. Give your health care provider a list of all the medicines, herbs, non-prescription drugs, or dietary supplements you use. Also tell them if you smoke, drink alcohol, or use illegal drugs. Some items may interact with your medicine. What should I watch for while using this medicine? Your condition will be monitored carefully while you are receiving this medicine. You may need blood work done while you are taking this medicine. Do not become pregnant while taking this medicine or for at least 5 months after stopping it. Women should inform their doctor if they wish to become pregnant or think they might be pregnant. There is a potential for serious side effects to an unborn child. Talk to your health care professional or pharmacist for more information. Do not breast-feed an infant while taking this medicine  or for at least 5 months after the last dose. What side effects may I notice from receiving this medicine? Side effects that you should report to your doctor or health care  professional as soon as possible: -allergic reactions like skin rash, itching or hives, swelling of the face, lips, or tongue -black, tarry stools -bloody or watery diarrhea -breathing problems -changes in vision -chest pain or chest tightness -chills -facial flushing -fever -headache -signs and symptoms of high blood sugar such as dizziness; dry mouth; dry skin; fruity breath; nausea; stomach pain; increased hunger or thirst; increased urination -signs and symptoms of liver injury like dark yellow or brown urine; general ill feeling or flu-like symptoms; light-colored stools; loss of appetite; nausea; right upper belly pain; unusually weak or tired; yellowing of the eyes or skin -stomach pain -trouble passing urine or change in the amount of urine Side effects that usually do not require medical attention (report to your doctor or health care professional if they continue or are bothersome): -cough -diarrhea -joint pain -muscle pain -muscle weakness -tiredness -weight loss This list may not describe all possible side effects. Call your doctor for medical advice about side effects. You may report side effects to FDA at 1-800-FDA-1088. Where should I keep my medicine? This drug is given in a hospital or clinic and will not be stored at home. NOTE: This sheet is a summary. It may not cover all possible information. If you have questions about this medicine, talk to your doctor, pharmacist, or health care provider.  2019 Elsevier/Gold Standard (2017-12-13 09:33:38)  Coronavirus (COVID-19) Are you at risk?  Are you at risk for the Coronavirus (COVID-19)?  To be considered HIGH RISK for Coronavirus (COVID-19), you have to meet the following criteria:  . Traveled to [place], [place], [place], [place] or [place]; or in the [place] to [place], [place], [place], or [place]; and have fever, cough, and shortness of breath within the last 2 weeks of travel OR . Been in close  contact with a person diagnosed with COVID-19 within the last 2 weeks and have fever, cough, and shortness of breath . IF YOU DO NOT MEET THESE CRITERIA, YOU ARE CONSIDERED LOW RISK FOR COVID-19.  What to do if you are HIGH RISK for COVID-19?  Marland Kitchen If you are having a medical emergency, call 911. . Seek medical care right away. Before you go to a doctor's office, urgent care or emergency department, call ahead and tell them about your recent travel, contact with someone diagnosed with COVID-19, and your symptoms. You should receive instructions from your physician's office regarding next steps of care.  . When you arrive at healthcare provider, tell the healthcare staff immediately you have returned from visiting [place], [place], [place], [place] or [place]; or traveled in the [place] to [place], [place], [place], or [place]; in the last two weeks or you have been in close contact with a person diagnosed with COVID-19 in the last 2 weeks.   . Tell the health care staff about your symptoms: fever, cough and shortness of breath. . After you have been seen by a medical provider, you will be either: o Tested for (COVID-19) and discharged home on quarantine except to seek medical care if symptoms worsen, and asked to  - Stay home and avoid contact with others until you get your results (4-5 days)  - Avoid travel on public transportation if possible (such as bus, train, or airplane) or o [employer]  to the Emergency Department by EMS for evaluation, COVID-19 testing, and possible admission depending on your condition and test results.  What to do if you are LOW RISK for COVID-19?  Reduce your risk of any infection by using the same precautions used for avoiding the common cold or flu:  Marland Kitchen Wash your hands often with soap and warm water for at least 20 seconds.  If soap and water are not readily available, use an alcohol-based hand sanitizer with at least 60% alcohol.  . If coughing or sneezing, cover  your mouth and nose by coughing or sneezing into the elbow areas of your shirt or coat, into a tissue or into your sleeve (not your hands). . Avoid shaking hands with others and consider head nods or verbal greetings only. . Avoid touching your eyes, nose, or mouth with unwashed hands.  . Avoid close contact with people who are sick. . Avoid places or events with large numbers of people in one location, like concerts or sporting events. . Carefully consider travel plans you have or are making. . If you are planning any travel outside or inside the [place], visit the CDC's Travelers' Health webpage for the latest health notices. . If you have some symptoms but not all symptoms, continue to monitor at home and seek medical attention if your symptoms worsen. . If you are having a medical emergency, call 911.   [place] / e-Visit: [email or web address]         [facility]: [facility]: [phone]                   [facility]: [phone]

## 2019-01-26 NOTE — Telephone Encounter (Signed)
Per 5/4 los appts already scheduled.

## 2019-01-27 ENCOUNTER — Inpatient Hospital Stay: Payer: Self-pay

## 2019-01-27 ENCOUNTER — Other Ambulatory Visit: Payer: Self-pay | Admitting: Hematology

## 2019-01-27 ENCOUNTER — Other Ambulatory Visit: Payer: Self-pay

## 2019-01-27 VITALS — BP 144/84 | HR 58 | Temp 97.1°F | Resp 18

## 2019-01-27 DIAGNOSIS — C3490 Malignant neoplasm of unspecified part of unspecified bronchus or lung: Secondary | ICD-10-CM

## 2019-01-27 DIAGNOSIS — Z7189 Other specified counseling: Secondary | ICD-10-CM

## 2019-01-27 LAB — T4: T4, Total: 7.8 ug/dL (ref 4.5–12.0)

## 2019-01-27 MED ORDER — OXYCODONE HCL ER 10 MG PO T12A: 10.0000 mg | EXTENDED_RELEASE_TABLET | Freq: Two times a day (BID) | ORAL | 0 refills | Status: DC

## 2019-01-27 MED ORDER — DEXAMETHASONE SODIUM PHOSPHATE 10 MG/ML IJ SOLN
INTRAMUSCULAR | Status: AC
  Filled 2019-01-27: qty 1

## 2019-01-27 MED ORDER — SODIUM CHLORIDE 0.9 % IV SOLN
50.0000 mg/m2 | Freq: Once | INTRAVENOUS | Status: AC
  Administered 2019-01-27: 10:00:00 110 mg via INTRAVENOUS
  Filled 2019-01-27: qty 5.5

## 2019-01-27 MED ORDER — DEXAMETHASONE SODIUM PHOSPHATE 10 MG/ML IJ SOLN
10.0000 mg | Freq: Once | INTRAMUSCULAR | Status: AC
  Administered 2019-01-27: 09:00:00 10 mg via INTRAVENOUS

## 2019-01-27 MED ORDER — OXYCODONE HCL 5 MG PO TABS: 5.0000 mg | ORAL_TABLET | Freq: Four times a day (QID) | ORAL | 0 refills | Status: DC | PRN

## 2019-01-27 MED ORDER — SODIUM CHLORIDE 0.9 % IV SOLN
Freq: Once | INTRAVENOUS | Status: AC
  Administered 2019-01-27: 09:00:00 via INTRAVENOUS
  Filled 2019-01-27: qty 250

## 2019-01-27 NOTE — Patient Instructions (Signed)
[facility] Discharge Instructions for Patients Receiving Chemotherapy  Today you received the following chemotherapy agents Etoposide (VEPESID).  To help prevent nausea and vomiting after your treatment, we encourage you to take your nausea medication as prescribed.   If you develop nausea and vomiting that is not controlled by your nausea medication, call the clinic.   BELOW ARE SYMPTOMS THAT SHOULD BE REPORTED IMMEDIATELY:  *FEVER GREATER THAN 100.5 F  *CHILLS WITH OR WITHOUT FEVER  NAUSEA AND VOMITING THAT IS NOT CONTROLLED WITH YOUR NAUSEA MEDICATION  *UNUSUAL SHORTNESS OF BREATH  *UNUSUAL BRUISING OR BLEEDING  TENDERNESS IN MOUTH AND THROAT WITH OR WITHOUT PRESENCE OF ULCERS  *URINARY PROBLEMS  *BOWEL PROBLEMS  UNUSUAL RASH Items with * indicate a potential emergency and should be followed up as soon as possible.  Feel free to call the clinic should you have any questions or concerns. The clinic phone number is [phone].  Please show the [facility] at check-in to the Emergency Department and triage nurse.    Coronavirus (COVID-19) Are you at risk?  Are you at risk for the Coronavirus (COVID-19)?  To be considered HIGH RISK for Coronavirus (COVID-19), you have to meet the following criteria:  . Traveled to [place], [place], [place], [place] or [place]; or in the [place] to [place], [place], [place], or [place]; and have fever, cough, and shortness of breath within the last 2 weeks of travel OR . Been in close contact with a person diagnosed with COVID-19 within the last 2 weeks and have fever, cough, and shortness of breath . IF YOU DO NOT MEET THESE CRITERIA, YOU ARE CONSIDERED LOW RISK FOR COVID-19.  What to do if you are HIGH RISK for COVID-19?  Marland Kitchen If you are having a medical emergency, call 911. . Seek medical care right away. Before you go to a doctor's office, urgent care or emergency department, call ahead and tell them  about your recent travel, contact with someone diagnosed with COVID-19, and your symptoms. You should receive instructions from your physician's office regarding next steps of care.  . When you arrive at healthcare provider, tell the healthcare staff immediately you have returned from visiting [place], [place], [place], [place] or [place]; or traveled in the [place] to [place], [place], [place], or [place]; in the last two weeks or you have been in close contact with a person diagnosed with COVID-19 in the last 2 weeks.   . Tell the health care staff about your symptoms: fever, cough and shortness of breath. . After you have been seen by a medical provider, you will be either: o Tested for (COVID-19) and discharged home on quarantine except to seek medical care if symptoms worsen, and asked to  - Stay home and avoid contact with others until you get your results (4-5 days)  - Avoid travel on public transportation if possible (such as bus, train, or airplane) or o Sent to the Emergency Department by EMS for evaluation, COVID-19 testing, and possible admission depending on your condition and test results.  What to do if you are LOW RISK for COVID-19?  Reduce your risk of any infection by using the same precautions used for avoiding the common cold or flu:  Marland Kitchen Wash your hands often with soap and warm water for at least 20 seconds.  If soap and water are not readily available, use an alcohol-based hand sanitizer with at least 60% alcohol.  . If  coughing or sneezing, cover your mouth and nose by coughing or sneezing into the elbow areas of your shirt or coat, into a tissue or into your sleeve (not your hands). . Avoid shaking hands with others and consider head nods or verbal greetings only. . Avoid touching your eyes, nose, or mouth with unwashed hands.  . Avoid close contact with people who are sick. . Avoid places or events with large numbers of people in one location, like concerts or  sporting events. . Carefully consider travel plans you have or are making. . If you are planning any travel outside or inside the [place], visit the CDC's Travelers' Health webpage for the latest health notices. . If you have some symptoms but not all symptoms, continue to monitor at home and seek medical attention if your symptoms worsen. . If you are having a medical emergency, call 911.   [facility] / e-Visit: [email or web address]         [facility]: [facility]: [phone]                   [facility]: [phone]

## 2019-01-28 ENCOUNTER — Inpatient Hospital Stay: Payer: Self-pay

## 2019-01-28 ENCOUNTER — Telehealth: Payer: Self-pay | Admitting: *Deleted

## 2019-01-28 ENCOUNTER — Other Ambulatory Visit: Payer: Self-pay

## 2019-01-28 ENCOUNTER — Encounter: Payer: Self-pay | Admitting: Pharmacy Technician

## 2019-01-28 VITALS — BP 132/71 | HR 53 | Temp 98.7°F | Resp 19

## 2019-01-28 DIAGNOSIS — C3490 Malignant neoplasm of unspecified part of unspecified bronchus or lung: Secondary | ICD-10-CM

## 2019-01-28 DIAGNOSIS — Z7189 Other specified counseling: Secondary | ICD-10-CM

## 2019-01-28 MED ORDER — SODIUM CHLORIDE 0.9 % IV SOLN
50.0000 mg/m2 | Freq: Once | INTRAVENOUS | Status: AC
  Administered 2019-01-28: 10:00:00 110 mg via INTRAVENOUS
  Filled 2019-01-28: qty 5.5

## 2019-01-28 MED ORDER — DEXAMETHASONE SODIUM PHOSPHATE 10 MG/ML IJ SOLN
10.0000 mg | Freq: Once | INTRAMUSCULAR | Status: AC
  Administered 2019-01-28: 09:00:00 10 mg via INTRAVENOUS

## 2019-01-28 MED ORDER — PEGFILGRASTIM 6 MG/0.6ML ~~LOC~~ PSKT
PREFILLED_SYRINGE | SUBCUTANEOUS | Status: AC
  Filled 2019-01-28: qty 0.6

## 2019-01-28 MED ORDER — DEXAMETHASONE SODIUM PHOSPHATE 10 MG/ML IJ SOLN
INTRAMUSCULAR | Status: AC
  Filled 2019-01-28: qty 1

## 2019-01-28 MED ORDER — PEGFILGRASTIM 6 MG/0.6ML ~~LOC~~ PSKT
6.0000 mg | PREFILLED_SYRINGE | Freq: Once | SUBCUTANEOUS | Status: AC
  Administered 2019-01-28: 11:00:00 6 mg via SUBCUTANEOUS

## 2019-01-28 MED ORDER — SODIUM CHLORIDE 0.9 % IV SOLN
Freq: Once | INTRAVENOUS | Status: AC
  Administered 2019-01-28: 09:00:00 via INTRAVENOUS
  Filled 2019-01-28: qty 250

## 2019-01-28 NOTE — Progress Notes (Signed)
The patient is approved for drug assistance by [place] for [employer]  Enrollment is effective until  01/25/21 and is based on self pay. Drug replacement will begin on DOS 01/05/19.

## 2019-01-28 NOTE — Patient Instructions (Addendum)
Coronavirus (COVID-19) Are you at risk?  Are you at risk for the Coronavirus (COVID-19)?  To be considered HIGH RISK for Coronavirus (COVID-19), you have to meet the following criteria:  . Traveled to [place], [place], [place], [place] or [place]; or in the [place] to [place], [place], [place], or [place]; and have fever, cough, and shortness of breath within the last 2 weeks of travel OR . Been in close contact with a person diagnosed with COVID-19 within the last 2 weeks and have fever, cough, and shortness of breath . IF YOU DO NOT MEET THESE CRITERIA, YOU ARE CONSIDERED LOW RISK FOR COVID-19.  What to do if you are HIGH RISK for COVID-19?  . If you are having a medical emergency, call 911. . Seek medical care right away. Before you go to a doctor's office, urgent care or emergency department, call ahead and tell them about your recent travel, contact with someone diagnosed with COVID-19, and your symptoms. You should receive instructions from your physician's office regarding next steps of care.  . When you arrive at healthcare provider, tell the healthcare staff immediately you have returned from visiting [place], [place], [place], [place] or [place]; or traveled in the [place] to [place], [place], [place], or [place]; in the last two weeks or you have been in close contact with a person diagnosed with COVID-19 in the last 2 weeks.   . Tell the health care staff about your symptoms: fever, cough and shortness of breath. . After you have been seen by a medical provider, you will be either: o Tested for (COVID-19) and discharged home on quarantine except to seek medical care if symptoms worsen, and asked to  - Stay home and avoid contact with others until you get your results (4-5 days)  - Avoid travel on public transportation if possible (such as bus, train, or airplane) or o Sent to the Emergency Department by EMS for evaluation, COVID-19 testing, and possible  admission depending on your condition and test results.  What to do if you are LOW RISK for COVID-19?  Reduce your risk of any infection by using the same precautions used for avoiding the common cold or flu:  . Wash your hands often with soap and warm water for at least 20 seconds.  If soap and water are not readily available, use an alcohol-based hand sanitizer with at least 60% alcohol.  . If coughing or sneezing, cover your mouth and nose by coughing or sneezing into the elbow areas of your shirt or coat, into a tissue or into your sleeve (not your hands). . Avoid shaking hands with others and consider head nods or verbal greetings only. . Avoid touching your eyes, nose, or mouth with unwashed hands.  . Avoid close contact with people who are sick. . Avoid places or events with large numbers of people in one location, like concerts or sporting events. . Carefully consider travel plans you have or are making. . If you are planning any travel outside or inside the [place], visit the CDC's Travelers' Health webpage for the latest health notices. . If you have some symptoms but not all symptoms, continue to monitor at home and seek medical attention if your symptoms worsen. . If you are having a medical emergency, call 911.   ADDITIONAL HEALTHCARE OPTIONS FOR PATIENTS  [facility] / e-Visit: [email or web address]         [facility]: [phone]  [facility]   Urgent Care: [facility]: [facility] Discharge Instructions for Patients Receiving Chemotherapy  Today you received the following chemotherapy agents: Etoposide (Vepesid, VP-16)  To help prevent nausea and vomiting after your treatment, we encourage you to take your nausea medication as directed.    If you develop nausea and vomiting that is not controlled by your nausea medication, call the  clinic.   BELOW ARE SYMPTOMS THAT SHOULD BE REPORTED IMMEDIATELY:  *FEVER GREATER THAN 100.5 F  *CHILLS WITH OR WITHOUT FEVER  NAUSEA AND VOMITING THAT IS NOT CONTROLLED WITH YOUR NAUSEA MEDICATION  *UNUSUAL SHORTNESS OF BREATH  *UNUSUAL BRUISING OR BLEEDING  TENDERNESS IN MOUTH AND THROAT WITH OR WITHOUT PRESENCE OF ULCERS  *URINARY PROBLEMS  *BOWEL PROBLEMS  UNUSUAL RASH Items with * indicate a potential emergency and should be followed up as soon as possible.  Feel free to call the clinic should you have any questions or concerns. The clinic phone number is [phone].  Please show the [facility] at check-in to the Emergency Department and triage nurse.  [facility] Discharge Instructions for Patients Receiving Chemotherapy  Today you received the following chemotherapy agents Etoposide, Neulasta  To help prevent nausea and vomiting after your treatment, we encourage you to take your nausea medication: As directed by MD   If you develop nausea and vomiting that is not controlled by your nausea medication, call the clinic.   BELOW ARE SYMPTOMS THAT SHOULD BE REPORTED IMMEDIATELY:  *FEVER GREATER THAN 100.5 F  *CHILLS WITH OR WITHOUT FEVER  NAUSEA AND VOMITING THAT IS NOT CONTROLLED WITH YOUR NAUSEA MEDICATION  *UNUSUAL SHORTNESS OF BREATH  *UNUSUAL BRUISING OR BLEEDING  TENDERNESS IN MOUTH AND THROAT WITH OR WITHOUT PRESENCE OF ULCERS  *URINARY PROBLEMS  *BOWEL PROBLEMS  UNUSUAL RASH Items with * indicate a potential emergency and should be followed up as soon as possible.  Feel free to call the clinic should you have any questions or concerns. The clinic phone number is [phone].  Please show the [facility] at check-in to the Emergency Department and triage nurse.

## 2019-01-28 NOTE — Telephone Encounter (Signed)
Late entry for 01/27/2019: Patient here for injection and asked to have medications for pain (Oxycontin and Oxycodone IR) sent to a different pharmacy. Instead of [facility], send to [place], [address] of [address] and [address]. Contacted [facility], spoke with Cristie Hem, prescriptions cancelled at that pharmacy. Dr. Irene Limbo sent new orders for meds to [facility]. Contacted patient to inform him, he verbalized understanding.

## 2019-01-28 NOTE — Telephone Encounter (Signed)
Patient called - had Neulasta OnPro applied today following infusion. Had appt reminder for Friday for injection. If that is for Neulasta, he won't need it. Confirmed it was for Neulasta injection- appt set up prior to change to OnPro. Patient notified that appt is cancelled. He verbalized understanding.

## 2019-01-29 ENCOUNTER — Encounter: Payer: Self-pay | Admitting: Pharmacy Technician

## 2019-01-29 NOTE — Progress Notes (Signed)
The patient is approved for drug assistance by [employer] for Neulasta OnPro.  Enrollment is effective until 01/28/20 and is based on self pay. Drug replacement will begin on DOS 01/07/19.

## 2019-01-30 ENCOUNTER — Ambulatory Visit: Payer: Self-pay

## 2019-02-13 NOTE — Progress Notes (Signed)
HEMATOLOGY/ONCOLOGY CLINIC NOTE  Date of Service: 02/17/2019  Patient Care Team: Default, Provider, MD as PCP - General  CHIEF COMPLAINTS/PURPOSE OF CONSULTATION:  Recently Diagnosed Small Cell Lung Cancer  HISTORY OF PRESENTING ILLNESS:  Cody Montoya a 63 y.o.malewith medical history significant ofalcohol abuse.He does not have a primary care provider and does not seek routine medical care. Around 3 weeks ago, the patient started having epigastric abdominal pain. It would wax and wane in severity but progressively worsenedsince onset. He has had associated nausea and vomiting. His appetite has decreased and he is lost around 10 pounds. He stopped drinking when this happened. He would drink around 7-8 shots of vodka daily in addition to the 2-3 beers. He has done this for the past 4-5 years. He does not have any known history of hepatitis B or C and denies taking any medication/supplements routinely. He denies any prominent vessels, yellowing of the skin/eyes, fevers, shortness of breath, or bowel changes. The patient has been using ibuprofen for pain with little relief.  In the emergency room, a CT scan showsmultifocal hepatocellular carcinoma that is not appear to be metastatic to the liver. There is also thoracic adenopathy,omental/peritoneal involvement and left pleural effusion suggestive of metastatic disease. The oncology team was called and recommended admission for work-up.   When seen today, the patient reports that he continues to have abdominal discomfort in his upper abdomen.  Along with the discomfort in his abdomen, he also reports nausea and vomiting and increased constipation.  This is been present for about 2 to 3 weeks.  He states his appetite has been decreased and he has lost weight.  He estimates that he has lost about 10 pounds over the past few months.  Oncology was asked to see the patient to make recommendations regarding his liver  masses.  Interval History:   Cody Montoya is here for follow-up of his extensive stage small cell lung cancer. The patient's last visit with [place] was on 01/26/19. The pt reports that he is doing well overall.  The pt reports that he felt nauseous for a couple days after his last infusion, and notes that this resolved on its own. He notes that he was able to stay active in the interim and notes that he is walking at least 30 minutes a day. The pt notes that he has been recently been feeling more like himself and endorses overall improved energy levels. He notes that he has been able to eat well and has been enjoying time spent outdoors fishing.  Lab results today (02/17/19) of CBC w/diff and CMP is as follows: all values are WNL except for RBC at 3.87, MCV at 102.8, Glucose at 102. 02/17/19 Magnesium at 2.0  On review of systems, pt reports stable energy levels, eating well, staying active, and denies abdominal pains, leg swelling, concerns for infections, and any other symptoms.   MEDICAL HISTORY:  Past Medical History:  Diagnosis Date   Cancer [facility])     SURGICAL HISTORY: Past Surgical History:  Procedure Laterality Date   NO PAST SURGERIES      SOCIAL HISTORY: Social History   Socioeconomic History   Marital status: Single    Spouse name: Not on file   Number of children: Not on file   Years of education: Not on file   Highest education level: Not on file  Occupational History   Occupation: Surveyor, quantity  Social Needs   Financial resource strain: Not very hard  Food insecurity:    Worry: Never true    Inability: Never true   Transportation needs:    Medical: No    Non-medical: No  Tobacco Use   Smoking status: Current Every Day Smoker    Packs/day: 1.00    Years: 45.00    Pack years: 45.00    Types: Cigarettes  Substance and Sexual Activity   Alcohol use: Yes    Alcohol/week: 8.0 standard drinks    Types: 3 Cans of beer, 5 Shots of liquor  per week    Comment: no alcohol in past month due to symptoms   Drug use: Not Currently    Frequency: 7.0 times per week    Types: Marijuana   Sexual activity: Not Currently  Lifestyle   Physical activity:    Days per week: 5 days    Minutes per session: 150+ min   Stress: To some extent  Relationships   Social connections:    Talks on phone: More than three times a week    Gets together: More than three times a week    Attends religious service: Never    Active member of club or organization: No    Attends meetings of clubs or organizations: Never    Relationship status: Living with partner   Intimate partner violence:    Fear of current or ex partner: No    Emotionally abused: No    Physically abused: No    Forced sexual activity: No  Other Topics Concern   Not on file  Social History Narrative   Not on file    FAMILY HISTORY: No family history on file.  ALLERGIES:  has No Known Allergies.  MEDICATIONS:  Current Outpatient Medications  Medication Sig Dispense Refill   docusate sodium (COLACE) 100 MG capsule Take 1 capsule (100 mg total) by mouth 2 (two) times daily. 10 capsule 0   ibuprofen (ADVIL,MOTRIN) 200 MG tablet Take 800 mg by mouth every 6 (six) hours as needed for fever or moderate pain.     ondansetron (ZOFRAN) 8 MG tablet Take 1 tablet (8 mg total) by mouth 2 (two) times daily as needed for refractory nausea / vomiting. Start on day 3 after carboplatin chemo. 30 tablet 1   oxyCODONE (OXY IR/ROXICODONE) 5 MG immediate release tablet Take 1-2 tablets (5-10 mg total) by mouth every 6 (six) hours as needed for moderate pain. 120 tablet 0   oxyCODONE (OXYCONTIN) 10 mg 12 hr tablet Take 1 tablet (10 mg total) by mouth every 12 (twelve) hours. 60 tablet 0   polyethylene glycol (MIRALAX / GLYCOLAX) packet Take 17 g by mouth daily as needed for mild constipation. 14 each 0   No current facility-administered medications for this visit.     Facility-Administered Medications Ordered in Other Visits  Medication Dose Route Frequency Provider Last Rate Last Dose   oxyCODONE (Oxy IR/ROXICODONE) immediate release tablet 5 mg  5 mg Oral Once Brunetta Genera, MD        REVIEW OF SYSTEMS:    A 10+ POINT REVIEW OF SYSTEMS WAS OBTAINED including neurology, dermatology, psychiatry, cardiac, respiratory, lymph, extremities, GI, GU, Musculoskeletal, constitutional, breasts, reproductive, HEENT.  All pertinent positives are noted in the HPI.  All others are negative.   PHYSICAL EXAMINATION: ECOG PERFORMANCE STATUS: 1-2 Vital signs reviewed in epic  GENERAL:alert, in no acute distress and comfortable SKIN: no acute rashes, no significant lesions EYES: conjunctiva are pink and non-injected, sclera anicteric OROPHARYNX: MMM, no exudates, no  oropharyngeal erythema or ulceration NECK: supple, no JVD LYMPH:  no palpable lymphadenopathy in the cervical, axillary or inguinal regions LUNGS: clear to auscultation b/l with normal respiratory effort HEART: regular rate & rhythm ABDOMEN: Improved hepatomegaly about 1-2 fingerbreadths below costal line, no TTP and negative Murphy's sign Extremity: no pedal edema PSYCH: alert & oriented x 3 with fluent speech NEURO: no focal motor/sensory deficits   LABORATORY DATA:  I have reviewed the data as listed  . CBC Latest Ref Rng & Units 02/17/2019 01/26/2019 01/15/2019  WBC 4.0 - 10.5 K/uL 9.4 7.8 11.3(H)  Hemoglobin 13.0 - 17.0 g/dL 13.0 11.2(L) 11.1(L)  Hematocrit 39.0 - 52.0 % 39.8 33.8(L) 33.5(L)  Platelets 150 - 400 K/uL 245 293 246    . CMP Latest Ref Rng & Units 02/17/2019 01/26/2019 01/15/2019  Glucose 70 - 99 mg/dL 102(H) 116(H) 97  BUN 8 - 23 mg/dL 12 10 7(L)  Creatinine 0.61 - 1.24 mg/dL 0.68 0.66 0.70  Sodium 135 - 145 mmol/L 135 136 136  Potassium 3.5 - 5.1 mmol/L 4.5 4.1 4.5  Chloride 98 - 111 mmol/L 104 104 102  CO2 22 - 32 mmol/L 24 23 25   Calcium 8.9 - 10.3 mg/dL 9.2 8.5(L)  9.2  Total Protein 6.5 - 8.1 g/dL 7.5 7.2 7.8  Total Bilirubin 0.3 - 1.2 mg/dL 0.5 1.3(H) 2.2(H)  Alkaline Phos 38 - 126 U/L 112 149(H) 303(H)  AST 15 - 41 U/L 16 15 19   ALT 0 - 44 U/L 17 12 18        RADIOGRAPHIC STUDIES: I have personally reviewed the radiological images as listed and agreed with the findings in the report. No results found.  ASSESSMENT & PLAN:   63 y.o. male with  1. 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 2. Mild chronic small vessel ischemic disease."   2.  Abnormal liver function tests.  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  Bump in bilirubin could be from progression of his liver mets.   12/18/18 US Abdomen revealed Widespread metastatic disease throughout the liver. The liver has a somewhat nodular contour suggesting underlying cirrhosis. 2. No gallstones seen. Gallbladder wall appears mildly thickened and edematous. Sludge is noted in the gallbladder. There is also prominence of the common bile duct without mass or calculus evident. These findings raise concern for potential degree of acalculus cholecystitis. In this regard, it may be prudent to consider nuclear medicine hepatobiliary imaging study to assess for cystic duct patency.  01/02/19 NM Hepatobiliary including GB revealed No biliary obstruction. 2. Normal hepatobiliary scan.  PLAN: -Discussed pt labwork today, 02/17/19; WBC and Hgb have normalized. Blood chemistries have normalized as well -Discussed the possibility to increase dose mildly as pt has been tolerating treatment quite well and discussed the increased risk of infection that accompanies dose escalation; all in overall context of non-curative treatment. Pt prefers mild dose escalation. -The pt has no prohibitive toxicities from continuing C4 Carboplatin AUC of 5, 50mg /m2  Etoposide, and Atezolizumab at this time. -Will repeat CT imaging after the conclusion of C4 to evaluate treatment efficacy and for further treatment planning -Pt will present to the ED if he develops fevers greater than 100.4 and has been advised to avoid individuals with infections -Continue Xgeva every 4 weeks for bone strengthening, denies dental pains, discussed risk of non-healing ulcers -Discussed that we can consider referral to [facility]  for consideration of RT to T10 for back pain. -Discussed pain management strategies again with the pt: 1-2 short acting Oxycodone every 4-6 hours as needed. Continue long acting Oxycontin. -After confirming with patient, a palliative care referral has been requested given the patient might have worsening symptom burden if he does not tolerate chemotherapy. -Recommended that the pt continue to eat well, drink at least 48-64 oz of water each day, and walk 20-30 minutes each day.  -Will see the pt back in 3 weeks   CT chest/abd/pelvis in 2 weeks Plz schedule C5 and C6 with labs and MD visit on Day1 of each cycle   All of the patients questions were answered with apparent satisfaction. The patient knows to call the clinic with any problems, questions or concerns.  The total time spent in the appt was 25 minutes and more than 50% was on counseling and direct patient cares.    Sullivan Lone MD [place] AAHIVMS [facility] Hematology/Oncology Physician [facility]  (Office):       [phone] (Work cell):  [phone] (Fax):           [phone]  I, Baldwin [place], am acting as a scribe for Dr. Sullivan Lone.   .I have reviewed the above documentation for accuracy and completeness, and I agree with the above. Brunetta Genera MD

## 2019-02-17 ENCOUNTER — Other Ambulatory Visit: Payer: Self-pay

## 2019-02-17 ENCOUNTER — Inpatient Hospital Stay: Payer: Self-pay

## 2019-02-17 ENCOUNTER — Inpatient Hospital Stay (HOSPITAL_BASED_OUTPATIENT_CLINIC_OR_DEPARTMENT_OTHER): Payer: Self-pay | Admitting: Hematology

## 2019-02-17 VITALS — BP 139/80 | HR 59 | Temp 98.3°F | Resp 18 | Ht 70.0 in | Wt 179.6 lb

## 2019-02-17 DIAGNOSIS — C7951 Secondary malignant neoplasm of bone: Secondary | ICD-10-CM

## 2019-02-17 DIAGNOSIS — R748 Abnormal levels of other serum enzymes: Secondary | ICD-10-CM

## 2019-02-17 DIAGNOSIS — C787 Secondary malignant neoplasm of liver and intrahepatic bile duct: Secondary | ICD-10-CM

## 2019-02-17 DIAGNOSIS — C3490 Malignant neoplasm of unspecified part of unspecified bronchus or lung: Secondary | ICD-10-CM

## 2019-02-17 DIAGNOSIS — Z7189 Other specified counseling: Secondary | ICD-10-CM

## 2019-02-17 DIAGNOSIS — Z5111 Encounter for antineoplastic chemotherapy: Secondary | ICD-10-CM

## 2019-02-17 DIAGNOSIS — Z720 Tobacco use: Secondary | ICD-10-CM

## 2019-02-17 LAB — CMP (CANCER CENTER ONLY)
ALT: 17 U/L (ref 0–44)
AST: 16 U/L (ref 15–41)
Albumin: 3.6 g/dL (ref 3.5–5.0)
Alkaline Phosphatase: 112 U/L (ref 38–126)
Anion gap: 7 (ref 5–15)
BUN: 12 mg/dL (ref 8–23)
CO2: 24 mmol/L (ref 22–32)
Calcium: 9.2 mg/dL (ref 8.9–10.3)
Chloride: 104 mmol/L (ref 98–111)
Creatinine: 0.68 mg/dL (ref 0.61–1.24)
GFR, Est AFR Am: >60 mL/min (ref >60)
GFR, Estimated: >60 mL/min (ref >60)
Glucose, Bld: 102 mg/dL — ABNORMAL HIGH (ref 70–99)
Potassium: 4.5 mmol/L (ref 3.5–5.1)
Sodium: 135 mmol/L (ref 135–145)
Total Bilirubin: 0.5 mg/dL (ref 0.3–1.2)
Total Protein: 7.5 g/dL (ref 6.5–8.1)

## 2019-02-17 LAB — CBC WITH DIFFERENTIAL/PLATELET
Abs Immature Granulocytes: 0.02 10*3/uL (ref 0.00–0.07)
Basophils Absolute: 0 10*3/uL (ref 0.0–0.1)
Basophils Relative: 0 %
Eosinophils Absolute: 0.1 10*3/uL (ref 0.0–0.5)
Eosinophils Relative: 1 %
HCT: 39.8 % (ref 39.0–52.0)
Hemoglobin: 13 g/dL (ref 13.0–17.0)
Immature Granulocytes: 0 %
Lymphocytes Relative: 16 %
Lymphs Abs: 1.5 10*3/uL (ref 0.7–4.0)
MCH: 33.6 pg (ref 26.0–34.0)
MCHC: 32.7 g/dL (ref 30.0–36.0)
MCV: 102.8 fL — ABNORMAL HIGH (ref 80.0–100.0)
Monocytes Absolute: 0.7 10*3/uL (ref 0.1–1.0)
Monocytes Relative: 7 %
Neutro Abs: 7.1 10*3/uL (ref 1.7–7.7)
Neutrophils Relative %: 76 %
Platelets: 245 10*3/uL (ref 150–400)
RBC: 3.87 MIL/uL — ABNORMAL LOW (ref 4.22–5.81)
RDW: 14.7 % (ref 11.5–15.5)
WBC: 9.4 10*3/uL (ref 4.0–10.5)
nRBC: 0 % (ref 0.0–0.2)

## 2019-02-17 LAB — MAGNESIUM: Magnesium: 2 mg/dL (ref 1.7–2.4)

## 2019-02-17 LAB — TSH: TSH: 1.444 u[IU]/mL (ref 0.320–4.118)

## 2019-02-17 MED ORDER — SODIUM CHLORIDE 0.9 % IV SOLN
1200.0000 mg | Freq: Once | INTRAVENOUS | Status: AC
  Administered 2019-02-17: 1200 mg via INTRAVENOUS
  Filled 2019-02-17: qty 20

## 2019-02-17 MED ORDER — SODIUM CHLORIDE 0.9 % IV SOLN
50.0000 mg/m2 | Freq: Once | INTRAVENOUS | Status: AC
  Administered 2019-02-17: 13:00:00 110 mg via INTRAVENOUS
  Filled 2019-02-17: qty 5.5

## 2019-02-17 MED ORDER — SODIUM CHLORIDE 0.9 % IV SOLN
700.0000 mg | Freq: Once | INTRAVENOUS | Status: AC
  Administered 2019-02-17: 700 mg via INTRAVENOUS
  Filled 2019-02-17: qty 70

## 2019-02-17 MED ORDER — SODIUM CHLORIDE 0.9 % IV SOLN
Freq: Once | INTRAVENOUS | Status: AC
  Administered 2019-02-17: 10:00:00 via INTRAVENOUS
  Filled 2019-02-17: qty 5

## 2019-02-17 MED ORDER — PALONOSETRON HCL INJECTION 0.25 MG/5ML
0.2500 mg | Freq: Once | INTRAVENOUS | Status: AC
  Administered 2019-02-17: 10:00:00 0.25 mg via INTRAVENOUS

## 2019-02-17 MED ORDER — DENOSUMAB 120 MG/1.7ML ~~LOC~~ SOLN
SUBCUTANEOUS | Status: AC
  Filled 2019-02-17: qty 1.7

## 2019-02-17 MED ORDER — DENOSUMAB 120 MG/1.7ML ~~LOC~~ SOLN
120.0000 mg | Freq: Once | SUBCUTANEOUS | Status: AC
  Administered 2019-02-17: 13:00:00 120 mg via SUBCUTANEOUS

## 2019-02-17 MED ORDER — SODIUM CHLORIDE 0.9 % IV SOLN
Freq: Once | INTRAVENOUS | Status: AC
  Administered 2019-02-17: 10:00:00 via INTRAVENOUS
  Filled 2019-02-17: qty 250

## 2019-02-17 MED ORDER — PALONOSETRON HCL INJECTION 0.25 MG/5ML
INTRAVENOUS | Status: AC
  Filled 2019-02-17: qty 5

## 2019-02-17 NOTE — Patient Instructions (Addendum)
[facility] Discharge Instructions for Patients Receiving Chemotherapy  Today you received the following chemotherapy agents Carboplatin, Etoposide and other agent: Tencentriq  To help prevent nausea and vomiting after your treatment, we encourage you to take your nausea medication: As directed by your MD   If you develop nausea and vomiting that is not controlled by your nausea medication, call the clinic.   BELOW ARE SYMPTOMS THAT SHOULD BE REPORTED IMMEDIATELY:  *FEVER GREATER THAN 100.5 F  *CHILLS WITH OR WITHOUT FEVER  NAUSEA AND VOMITING THAT IS NOT CONTROLLED WITH YOUR NAUSEA MEDICATION  *UNUSUAL SHORTNESS OF BREATH  *UNUSUAL BRUISING OR BLEEDING  TENDERNESS IN MOUTH AND THROAT WITH OR WITHOUT PRESENCE OF ULCERS  *URINARY PROBLEMS  *BOWEL PROBLEMS  UNUSUAL RASH Items with * indicate a potential emergency and should be followed up as soon as possible.  Feel free to call the clinic should you have any questions or concerns. The clinic phone number is [phone].  Please show the [facility] at check-in to the Emergency Department and triage nurse.  Denosumab injection What is this medicine? DENOSUMAB (den oh sue mab) slows bone breakdown. Prolia is used to treat osteoporosis in women after menopause and in men, and in people who are taking corticosteroids for 6 months or more. Delton See is used to treat a high calcium level due to cancer and to prevent bone fractures and other bone problems caused by multiple myeloma or cancer bone metastases. Delton See is also used to treat giant cell tumor of the bone. This medicine may be used for other purposes; ask your health care provider or pharmacist if you have questions. COMMON BRAND NAME(S): Prolia, XGEVA What should I tell my health care provider before I take this medicine? They need to know if you have any of these conditions: -dental disease -having surgery or tooth extraction -infection -kidney  disease -low levels of calcium or Vitamin D in the blood -malnutrition -on hemodialysis -skin conditions or sensitivity -thyroid or parathyroid disease -an unusual reaction to denosumab, other medicines, foods, dyes, or preservatives -pregnant or trying to get pregnant -breast-feeding How should I use this medicine? This medicine is for injection under the skin. It is given by a health care professional in a hospital or clinic setting. A special MedGuide will be given to you before each treatment. Be sure to read this information carefully each time. For Prolia, talk to your pediatrician regarding the use of this medicine in children. Special care may be needed. For Delton See, talk to your pediatrician regarding the use of this medicine in children. While this drug may be prescribed for children as young as 13 years for selected conditions, precautions do apply. Overdosage: If you think you have taken too much of this medicine contact a poison control center or emergency room at once. NOTE: This medicine is only for you. Do not share this medicine with others. What if I miss a dose? It is important not to miss your dose. Call your doctor or health care professional if you are unable to keep an appointment. What may interact with this medicine? Do not take this medicine with any of the following medications: -other medicines containing denosumab This medicine may also interact with the following medications: -medicines that lower your chance of fighting infection -steroid medicines like prednisone or cortisone This list may not describe all possible interactions. Give your health care provider a list of all the medicines, herbs, non-prescription drugs, or dietary supplements you use. Also  tell them if you smoke, drink alcohol, or use illegal drugs. Some items may interact with your medicine. What should I watch for while using this medicine? Visit your doctor or health care professional for  regular checks on your progress. Your doctor or health care professional may order blood tests and other tests to see how you are doing. Call your doctor or health care professional for advice if you get a fever, chills or sore throat, or other symptoms of a cold or flu. Do not treat yourself. This drug may decrease your body's ability to fight infection. Try to avoid being around people who are sick. You should make sure you get enough calcium and vitamin D while you are taking this medicine, unless your doctor tells you not to. Discuss the foods you eat and the vitamins you take with your health care professional. See your dentist regularly. Brush and floss your teeth as directed. Before you have any dental work done, tell your dentist you are receiving this medicine. Do not become pregnant while taking this medicine or for 5 months after stopping it. Talk with your doctor or health care professional about your birth control options while taking this medicine. Women should inform their doctor if they wish to become pregnant or think they might be pregnant. There is a potential for serious side effects to an unborn child. Talk to your health care professional or pharmacist for more information. What side effects may I notice from receiving this medicine? Side effects that you should report to your doctor or health care professional as soon as possible: -allergic reactions like skin rash, itching or hives, swelling of the face, lips, or tongue -bone pain -breathing problems -dizziness -jaw pain, especially after dental work -redness, blistering, peeling of the skin -signs and symptoms of infection like fever or chills; cough; sore throat; pain or trouble passing urine -signs of low calcium like fast heartbeat, muscle cramps or muscle pain; pain, tingling, numbness in the hands or feet; seizures -unusual bleeding or bruising -unusually weak or tired Side effects that usually do not require medical  attention (report to your doctor or health care professional if they continue or are bothersome): -constipation -diarrhea -headache -joint pain -loss of appetite -muscle pain -runny nose -tiredness -upset stomach This list may not describe all possible side effects. Call your doctor for medical advice about side effects. You may report side effects to FDA at 1-800-FDA-1088. Where should I keep my medicine? This medicine is only given in a clinic, doctor's office, or other health care setting and will not be stored at home. NOTE: This sheet is a summary. It may not cover all possible information. If you have questions about this medicine, talk to your doctor, pharmacist, or health care provider.  2019 Elsevier/Gold Standard (2018-01-17 16:10:44)   Coronavirus (COVID-19) Are you at risk?  Are you at risk for the Coronavirus (COVID-19)?  To be considered HIGH RISK for Coronavirus (COVID-19), you have to meet the following criteria:  . Traveled to [place], [place], [place], [place] or [place]; or in the [place] to [place], [place], [place], or [place]; and have fever, cough, and shortness of breath within the last 2 weeks of travel OR . Been in close contact with a person diagnosed with COVID-19 within the last 2 weeks and have fever, cough, and shortness of breath . IF YOU DO NOT MEET THESE CRITERIA, YOU ARE CONSIDERED LOW RISK FOR COVID-19.  What to do if you are  HIGH RISK for COVID-19?  Marland Kitchen If you are having a medical emergency, call 911. . Seek medical care right away. Before you go to a doctor's office, urgent care or emergency department, call ahead and tell them about your recent travel, contact with someone diagnosed with COVID-19, and your symptoms. You should receive instructions from your physician's office regarding next steps of care.  . When you arrive at healthcare provider, tell the healthcare staff immediately you have returned from visiting [place], [place], [place],  [place] or [place]; or traveled in the [place] to [place], [place], [place], or [place]; in the last two weeks or you have been in close contact with a person diagnosed with COVID-19 in the last 2 weeks.   . Tell the health care staff about your symptoms: fever, cough and shortness of breath. . After you have been seen by a medical provider, you will be either: o Tested for (COVID-19) and discharged home on quarantine except to seek medical care if symptoms worsen, and asked to  - Stay home and avoid contact with others until you get your results (4-5 days)  - Avoid travel on public transportation if possible (such as bus, train, or airplane) or o Sent to the Emergency Department by EMS for evaluation, COVID-19 testing, and possible admission depending on your condition and test results.  What to do if you are LOW RISK for COVID-19?  Reduce your risk of any infection by using the same precautions used for avoiding the common cold or flu:  Marland Kitchen Wash your hands often with soap and warm water for at least 20 seconds.  If soap and water are not readily available, use an alcohol-based hand sanitizer with at least 60% alcohol.  . If coughing or sneezing, cover your mouth and nose by coughing or sneezing into the elbow areas of your shirt or coat, into a tissue or into your sleeve (not your hands). . Avoid shaking hands with others and consider head nods or verbal greetings only. . Avoid touching your eyes, nose, or mouth with unwashed hands.  . Avoid close contact with people who are sick. . Avoid places or events with large numbers of people in one location, like concerts or sporting events. . Carefully consider travel plans you have or are making. . If you are planning any travel outside or inside the [place], visit the CDC's Travelers' Health webpage for the latest health notices. . If you have some symptoms but not all symptoms, continue to monitor at home and seek medical attention if  your symptoms worsen. . If you are having a medical emergency, call 911.   [place] / e-Visit: [email or web address]         [facility]: [facility]: [phone]                   [facility]: [phone]

## 2019-02-18 ENCOUNTER — Telehealth: Payer: Self-pay | Admitting: Hematology

## 2019-02-18 ENCOUNTER — Other Ambulatory Visit: Payer: Self-pay

## 2019-02-18 ENCOUNTER — Inpatient Hospital Stay: Payer: Self-pay

## 2019-02-18 VITALS — BP 143/76 | HR 57 | Temp 99.2°F | Resp 16

## 2019-02-18 DIAGNOSIS — C3490 Malignant neoplasm of unspecified part of unspecified bronchus or lung: Secondary | ICD-10-CM

## 2019-02-18 DIAGNOSIS — Z7189 Other specified counseling: Secondary | ICD-10-CM

## 2019-02-18 LAB — T4: T4, Total: 6.9 ug/dL (ref 4.5–12.0)

## 2019-02-18 MED ORDER — SODIUM CHLORIDE 0.9 % IV SOLN
Freq: Once | INTRAVENOUS | Status: AC
  Administered 2019-02-18: 09:00:00 via INTRAVENOUS
  Filled 2019-02-18: qty 250

## 2019-02-18 MED ORDER — DEXAMETHASONE SODIUM PHOSPHATE 10 MG/ML IJ SOLN
10.0000 mg | Freq: Once | INTRAMUSCULAR | Status: AC
  Administered 2019-02-18: 10:00:00 10 mg via INTRAVENOUS

## 2019-02-18 MED ORDER — SODIUM CHLORIDE 0.9 % IV SOLN
50.0000 mg/m2 | Freq: Once | INTRAVENOUS | Status: AC
  Administered 2019-02-18: 10:00:00 110 mg via INTRAVENOUS
  Filled 2019-02-18: qty 5.5

## 2019-02-18 MED ORDER — DEXAMETHASONE SODIUM PHOSPHATE 10 MG/ML IJ SOLN
INTRAMUSCULAR | Status: AC
  Filled 2019-02-18: qty 1

## 2019-02-18 NOTE — Patient Instructions (Signed)
[facility] Discharge Instructions for Patients Receiving Chemotherapy  Today you received the following chemotherapy agents: Etoposide   To help prevent nausea and vomiting after your treatment, we encourage you to take your nausea medication as directed.    If you develop nausea and vomiting that is not controlled by your nausea medication, call the clinic.   BELOW ARE SYMPTOMS THAT SHOULD BE REPORTED IMMEDIATELY:  *FEVER GREATER THAN 100.5 F  *CHILLS WITH OR WITHOUT FEVER  NAUSEA AND VOMITING THAT IS NOT CONTROLLED WITH YOUR NAUSEA MEDICATION  *UNUSUAL SHORTNESS OF BREATH  *UNUSUAL BRUISING OR BLEEDING  TENDERNESS IN MOUTH AND THROAT WITH OR WITHOUT PRESENCE OF ULCERS  *URINARY PROBLEMS  *BOWEL PROBLEMS  UNUSUAL RASH Items with * indicate a potential emergency and should be followed up as soon as possible.  Feel free to call the clinic should you have any questions or concerns. The clinic phone number is [phone].  Please show the [facility] at check-in to the Emergency Department and triage nurse.

## 2019-02-18 NOTE — Telephone Encounter (Signed)
Per 5/26 los,appts already scheduled.

## 2019-02-19 ENCOUNTER — Inpatient Hospital Stay: Payer: Self-pay

## 2019-02-19 ENCOUNTER — Other Ambulatory Visit: Payer: Self-pay

## 2019-02-19 VITALS — BP 154/97 | HR 58 | Temp 98.8°F | Resp 16

## 2019-02-19 DIAGNOSIS — Z7189 Other specified counseling: Secondary | ICD-10-CM

## 2019-02-19 DIAGNOSIS — C3490 Malignant neoplasm of unspecified part of unspecified bronchus or lung: Secondary | ICD-10-CM

## 2019-02-19 MED ORDER — SODIUM CHLORIDE 0.9 % IV SOLN
50.0000 mg/m2 | Freq: Once | INTRAVENOUS | Status: AC
  Administered 2019-02-19: 10:00:00 110 mg via INTRAVENOUS
  Filled 2019-02-19: qty 5.5

## 2019-02-19 MED ORDER — DEXAMETHASONE SODIUM PHOSPHATE 10 MG/ML IJ SOLN
10.0000 mg | Freq: Once | INTRAMUSCULAR | Status: AC
  Administered 2019-02-19: 10 mg via INTRAVENOUS

## 2019-02-19 MED ORDER — PEGFILGRASTIM 6 MG/0.6ML ~~LOC~~ PSKT
PREFILLED_SYRINGE | SUBCUTANEOUS | Status: AC
  Filled 2019-02-19: qty 0.6

## 2019-02-19 MED ORDER — SODIUM CHLORIDE 0.9 % IV SOLN
Freq: Once | INTRAVENOUS | Status: AC
  Administered 2019-02-19: 09:00:00 via INTRAVENOUS
  Filled 2019-02-19: qty 250

## 2019-02-19 MED ORDER — DEXAMETHASONE SODIUM PHOSPHATE 10 MG/ML IJ SOLN
INTRAMUSCULAR | Status: AC
  Filled 2019-02-19: qty 1

## 2019-02-19 MED ORDER — PEGFILGRASTIM 6 MG/0.6ML ~~LOC~~ PSKT
6.0000 mg | PREFILLED_SYRINGE | Freq: Once | SUBCUTANEOUS | Status: AC
  Administered 2019-02-19: 11:00:00 6 mg via SUBCUTANEOUS

## 2019-02-19 NOTE — Patient Instructions (Signed)
[facility] Discharge Instructions for Patients Receiving Chemotherapy  Today you received the following chemotherapy agents: Etoposide  To help prevent nausea and vomiting after your treatment, we encourage you to take your nausea medication as directed.   If you develop nausea and vomiting that is not controlled by your nausea medication, call the clinic.   BELOW ARE SYMPTOMS THAT SHOULD BE REPORTED IMMEDIATELY:  *FEVER GREATER THAN 100.5 F  *CHILLS WITH OR WITHOUT FEVER  NAUSEA AND VOMITING THAT IS NOT CONTROLLED WITH YOUR NAUSEA MEDICATION  *UNUSUAL SHORTNESS OF BREATH  *UNUSUAL BRUISING OR BLEEDING  TENDERNESS IN MOUTH AND THROAT WITH OR WITHOUT PRESENCE OF ULCERS  *URINARY PROBLEMS  *BOWEL PROBLEMS  UNUSUAL RASH Items with * indicate a potential emergency and should be followed up as soon as possible.  Feel free to call the clinic should you have any questions or concerns. The clinic phone number is [phone].  Please show the [facility] at check-in to the Emergency Department and triage nurse.

## 2019-02-21 ENCOUNTER — Ambulatory Visit: Payer: Self-pay

## 2019-02-24 ENCOUNTER — Telehealth: Payer: Self-pay | Admitting: *Deleted

## 2019-02-24 ENCOUNTER — Other Ambulatory Visit: Payer: Self-pay | Admitting: *Deleted

## 2019-02-24 MED ORDER — OXYCODONE HCL 5 MG PO TABS: 5.0000 mg | ORAL_TABLET | Freq: Four times a day (QID) | ORAL | 0 refills | Status: DC | PRN

## 2019-02-24 NOTE — Telephone Encounter (Signed)
Patient requested refill of OxyContin. States its due on Friday, and sometimes the pharmacy has trouble getting it.

## 2019-02-25 ENCOUNTER — Other Ambulatory Visit: Payer: Self-pay | Admitting: *Deleted

## 2019-02-25 MED ORDER — OXYCODONE HCL ER 10 MG PO T12A: 10.0000 mg | EXTENDED_RELEASE_TABLET | Freq: Two times a day (BID) | ORAL | 0 refills | Status: DC

## 2019-02-25 NOTE — Telephone Encounter (Signed)
Patient called and requested refill of Oxycontin. States medication will be needed on Friday 02/27/2019.

## 2019-02-26 ENCOUNTER — Encounter: Payer: Self-pay | Admitting: Pharmacy Technician

## 2019-02-26 NOTE — Progress Notes (Signed)
The patient is approved for drug assistance by [employer] for Xgeva. Enrollment is effective until 02/26/20 and is based on self pay.  Drug replacement will begin on DOS 01/07/19.

## 2019-03-02 ENCOUNTER — Telehealth: Payer: Self-pay | Admitting: *Deleted

## 2019-03-02 ENCOUNTER — Other Ambulatory Visit: Payer: Self-pay | Admitting: Hematology

## 2019-03-02 MED ORDER — OXYCODONE HCL ER 10 MG PO T12A: 10.0000 mg | EXTENDED_RELEASE_TABLET | Freq: Two times a day (BID) | ORAL | 0 refills | Status: DC

## 2019-03-02 NOTE — Telephone Encounter (Signed)
Patient called - Asked that RX sent to [facility] for Oxycontin 10 mg tablet be cancelled. They don't have generic and insurance doesn't cover brand name. He wants it sent to [facility] because they have generic. Dr. Irene Limbo informed of request.

## 2019-03-03 ENCOUNTER — Other Ambulatory Visit: Payer: Self-pay

## 2019-03-03 ENCOUNTER — Ambulatory Visit (HOSPITAL_COMMUNITY)
Admission: RE | Admit: 2019-03-03 | Discharge: 2019-03-03 | Disposition: A | Discharge status: Home / Self Care | Payer: Self-pay | Source: Ambulatory Visit | Attending: Hematology | Admitting: Hematology

## 2019-03-03 DIAGNOSIS — C3490 Malignant neoplasm of unspecified part of unspecified bronchus or lung: Secondary | ICD-10-CM | POA: Insufficient documentation

## 2019-03-03 DIAGNOSIS — Z5111 Encounter for antineoplastic chemotherapy: Secondary | ICD-10-CM | POA: Insufficient documentation

## 2019-03-03 MED ORDER — IOHEXOL 300 MG/ML  SOLN
100.0000 mL | Freq: Once | INTRAMUSCULAR | Status: AC | PRN
  Administered 2019-03-03: 100 mL via INTRAVENOUS

## 2019-03-03 MED ORDER — SODIUM CHLORIDE (PF) 0.9 % IJ SOLN
INTRAMUSCULAR | Status: AC
  Filled 2019-03-03: qty 50

## 2019-03-06 NOTE — Progress Notes (Signed)
HEMATOLOGY/ONCOLOGY CLINIC NOTE  Date of Service: 03/09/2019  Patient Care Team: Default, Provider, MD as PCP - General  CHIEF COMPLAINTS/PURPOSE OF CONSULTATION:  Recently Diagnosed Small Cell Lung Cancer  HISTORY OF PRESENTING ILLNESS:  Rashee Marschall Stubitsis a 63 y.o.malewith medical history significant ofalcohol abuse.He does not have a primary care provider and does not seek routine medical care. Around 3 weeks ago, the patient started having epigastric abdominal pain. It would wax and wane in severity but progressively worsenedsince onset. He has had associated nausea and vomiting. His appetite has decreased and he is lost around 10 pounds. He stopped drinking when this happened. He would drink around 7-8 shots of vodka daily in addition to the 2-3 beers. He has done this for the past 4-5 years. He does not have any known history of hepatitis B or C and denies taking any medication/supplements routinely. He denies any prominent vessels, yellowing of the skin/eyes, fevers, shortness of breath, or bowel changes. The patient has been using ibuprofen for pain with little relief.  In the emergency room, a CT scan showsmultifocal hepatocellular carcinoma that is not appear to be metastatic to the liver. There is also thoracic adenopathy,omental/peritoneal involvement and left pleural effusion suggestive of metastatic disease. The oncology team was called and recommended admission for work-up.   When seen today, the patient reports that he continues to have abdominal discomfort in his upper abdomen.  Along with the discomfort in his abdomen, he also reports nausea and vomiting and increased constipation.  This is been present for about 2 to 3 weeks.  He states his appetite has been decreased and he has lost weight.  He estimates that he has lost about 10 pounds over the past few months.  Oncology was asked to see the patient to make recommendations regarding his liver  masses.  Interval History:   Kamin Niblack is here for follow-up of his extensive stage small cell lung cancer. The patient's last visit with [place] was on 02/17/19.  The pt reports that he has been feeling well in the interim. He has continued slowly gaining back the weight he had lost prior to diagnosis. He notes that his back pain has been well controlled overall. He denies any concerns for infections.  The pt notes that he has been enjoying spending time fishing with his son. He is looking forward to visiting family in [place] in a couple weeks.  Of note since the patient's last visit, pt has had a CT C/A/P completed on 03/03/19 with results revealing "Substantial partial treatment response as detailed below. No new or progressive metastatic disease. 2. Central left upper lobe lung mass is substantially decreased in size. 3. Mediastinal adenopathy is substantially decreased, with residual mild AP window adenopathy. Retroperitoneal adenopathy has resolved. 4. Liver metastases are decreased. 5. Stable faintly sclerotic osseous lesions in the spine and left iliac wing. 6. Stable irregular thickening of the adrenal glands without discrete adrenal nodules. 7.  Aortic Atherosclerosis."  Lab results today (03/09/19) of CBC w/diff and CMP is as follows: all values are WNL except for RBC at 4.10, MCV at 101.7 ,CO2 at 21, AST at 14. 03/09/19 Magnesium at 1.7  On review of systems, pt reports eating well, mild weight gain, good energy levels, well controlled pain, and denies concerns for infections, and any other symptoms.   MEDICAL HISTORY:  Past Medical History:  Diagnosis Date   Cancer [facility])     SURGICAL HISTORY: Past Surgical History:  Procedure Laterality  Date   NO PAST SURGERIES      SOCIAL HISTORY: Social History   Socioeconomic History   Marital status: Single    Spouse name: Not on file   Number of children: Not on file   Years of education: Not on file   Highest  education level: Not on file  Occupational History   Occupation: Surveyor, quantity  Social Needs   Financial resource strain: Not very hard   Food insecurity    Worry: Never true    Inability: Never true   Transportation needs    Medical: No    Non-medical: No  Tobacco Use   Smoking status: Current Every Day Smoker    Packs/day: 1.00    Years: 45.00    Pack years: 45.00    Types: Cigarettes  Substance and Sexual Activity   Alcohol use: Yes    Alcohol/week: 8.0 standard drinks    Types: 3 Cans of beer, 5 Shots of liquor per week    Comment: no alcohol in past month due to symptoms   Drug use: Not Currently    Frequency: 7.0 times per week    Types: Marijuana   Sexual activity: Not Currently  Lifestyle   Physical activity    Days per week: 5 days    Minutes per session: 150+ min   Stress: To some extent  Relationships   Social connections    Talks on phone: More than three times a week    Gets together: More than three times a week    Attends religious service: Never    Active member of club or organization: No    Attends meetings of clubs or organizations: Never    Relationship status: Living with partner   Intimate partner violence    Fear of current or ex partner: No    Emotionally abused: No    Physically abused: No    Forced sexual activity: No  Other Topics Concern   Not on file  Social History Narrative   Not on file    FAMILY HISTORY: No family history on file.  ALLERGIES:  has No Known Allergies.  MEDICATIONS:  Current Outpatient Medications  Medication Sig Dispense Refill   docusate sodium (COLACE) 100 MG capsule Take 1 capsule (100 mg total) by mouth 2 (two) times daily. 10 capsule 0   ibuprofen (ADVIL,MOTRIN) 200 MG tablet Take 800 mg by mouth every 6 (six) hours as needed for fever or moderate pain.     ondansetron (ZOFRAN) 8 MG tablet Take 1 tablet (8 mg total) by mouth 2 (two) times daily as needed for refractory nausea /  vomiting. Start on day 3 after carboplatin chemo. 30 tablet 1   oxyCODONE (OXY IR/ROXICODONE) 5 MG immediate release tablet Take 1-2 tablets (5-10 mg total) by mouth every 6 (six) hours as needed for moderate pain. 120 tablet 0   oxyCODONE (OXYCONTIN) 10 mg 12 hr tablet Take 1 tablet (10 mg total) by mouth every 12 (twelve) hours. 60 tablet 0   polyethylene glycol (MIRALAX / GLYCOLAX) packet Take 17 g by mouth daily as needed for mild constipation. 14 each 0   No current facility-administered medications for this visit.    Facility-Administered Medications Ordered in Other Visits  Medication Dose Route Frequency Provider Last Rate Last Dose   oxyCODONE (Oxy IR/ROXICODONE) immediate release tablet 5 mg  5 mg Oral Once Brunetta Genera, MD        REVIEW OF SYSTEMS:    A  10+ POINT REVIEW OF SYSTEMS WAS OBTAINED including neurology, dermatology, psychiatry, cardiac, respiratory, lymph, extremities, GI, GU, Musculoskeletal, constitutional, breasts, reproductive, HEENT.  All pertinent positives are noted in the HPI.  All others are negative.   PHYSICAL EXAMINATION: ECOG PERFORMANCE STATUS: 1-2 Vital signs reviewed in epic  GENERAL:alert, in no acute distress and comfortable SKIN: no acute rashes, no significant lesions EYES: conjunctiva are pink and non-injected, sclera anicteric OROPHARYNX: MMM, no exudates, no oropharyngeal erythema or ulceration NECK: supple, no JVD LYMPH:  no palpable lymphadenopathy in the cervical, axillary or inguinal regions LUNGS: clear to auscultation b/l with normal respiratory effort HEART: regular rate & rhythm ABDOMEN: Improved hepatomegaly about 1-2 fingerbreadths below costal line, no TTP, and negative Murphy's sign Extremity: no pedal edema PSYCH: alert & oriented x 3 with fluent speech NEURO: no focal motor/sensory deficits   LABORATORY DATA:  I have reviewed the data as listed  . CBC Latest Ref Rng & Units 03/09/2019 02/17/2019 01/26/2019  WBC  4.0 - 10.5 K/uL 8.0 9.4 7.8  Hemoglobin 13.0 - 17.0 g/dL 13.6 13.0 11.2(L)  Hematocrit 39.0 - 52.0 % 41.7 39.8 33.8(L)  Platelets 150 - 400 K/uL 212 245 293    . CMP Latest Ref Rng & Units 03/09/2019 02/17/2019 01/26/2019  Glucose 70 - 99 mg/dL 88 102(H) 116(H)  BUN 8 - 23 mg/dL 10 12 10   Creatinine 0.61 - 1.24 mg/dL 0.65 0.68 0.66  Sodium 135 - 145 mmol/L 138 135 136  Potassium 3.5 - 5.1 mmol/L 4.0 4.5 4.1  Chloride 98 - 111 mmol/L 108 104 104  CO2 22 - 32 mmol/L 21(L) 24 23  Calcium 8.9 - 10.3 mg/dL 9.1 9.2 8.5(L)  Total Protein 6.5 - 8.1 g/dL 7.3 7.5 7.2  Total Bilirubin 0.3 - 1.2 mg/dL 0.4 0.5 1.3(H)  Alkaline Phos 38 - 126 U/L 96 112 149(H)  AST 15 - 41 U/L 14(L) 16 15  ALT 0 - 44 U/L 15 17 12        RADIOGRAPHIC STUDIES: I have personally reviewed the radiological images as listed and agreed with the findings in the report. Ct Chest W Contrast  Result Date: 03/03/2019 CLINICAL DATA:  Extensive stage left upper lobe small cell lung cancer status post chemotherapy. Assess treatment response. EXAM: CT CHEST, ABDOMEN, AND PELVIS WITH CONTRAST TECHNIQUE: Multidetector CT imaging of the chest, abdomen and pelvis was performed following the standard protocol during bolus administration of intravenous contrast. CONTRAST:  112mL OMNIPAQUE IOHEXOL 300 MG/ML  SOLN COMPARISON:  12/01/2018 chest CT.  11/29/2018 CT abdomen/pelvis. FINDINGS: CT CHEST FINDINGS Cardiovascular: Normal heart size. No significant pericardial effusion/thickening. Three-vessel coronary atherosclerosis. Atherosclerotic nonaneurysmal thoracic aorta. Normal caliber pulmonary arteries. No central pulmonary emboli. Mediastinum/Nodes: No discrete thyroid nodules. Unremarkable esophagus. No axillary adenopathy. Mediastinal adenopathy is substantially decreased. Mildly enlarged 1.3 cm AP window node (series 2/image 22), decreased from 3.1 cm using similar measurement technique. No additional residual pathologically enlarged  mediastinal nodes. No pathologically enlarged hilar nodes. Lungs/Pleura: No pneumothorax. No pleural effusion. Diffuse bronchial wall thickening. Central left upper lobe lung mass has substantially decreased in size, now 1.6 x 1.3 cm (series 2/image 23), compared to 6.3 x 4.9 cm on 12/01/2018 chest CT using similar measurement technique. No acute consolidative airspace disease or new significant pulmonary nodules. Musculoskeletal: Known osseous metastases throughout the thoracic spine remain relatively occult on CT, with faint sclerosis within some of the lesions, for example within the T2 and T9 vertebral bodies. No appreciable new osseous lesions in the chest.  Moderate thoracic spondylosis. CT ABDOMEN PELVIS FINDINGS Hepatobiliary: Numerous (greater than 10) irregular hypodense liver masses scattered throughout the liver, decreased in size since 11/29/2018 CT abdomen study. Representative inferior right liver lobe 4.5 x 3.0 cm mass (series 2/image 69), decreased from 7.2 x 5.1 cm. Representative segment 7 right liver lobe 4.5 x 3.2 cm mass (series 2/image 57), decreased from 6.7 x 4.8 cm. Representative anterior segment 2 left liver lobe 3.0 x 1.8 cm mass (series 2/image 56), decreased from 5.0 x 2.8 cm. No appreciable new liver lesions. Normal gallbladder with no radiopaque cholelithiasis. No biliary ductal dilatation. Pancreas: Normal, with no mass or duct dilation. Spleen: Normal size. No mass. Adrenals/Urinary Tract: Stable irregular mild thickening of both adrenal glands without discrete adrenal nodules. No hydronephrosis. Subcentimeter hypodense renal cortical lesions in the interpolar right kidney and upper left kidney are too small to characterize and are stable. No new renal lesions. Normal bladder. Stomach/Bowel: Normal non-distended stomach. Normal caliber small bowel with no small bowel wall thickening. Oral contrast transits to cecum. Normal appendix. Normal large bowel with no diverticulosis, large  bowel wall thickening or pericolonic fat stranding. Vascular/Lymphatic: Atherosclerotic nonaneurysmal abdominal aorta. Patent portal, splenic, hepatic and renal veins. Previously visualized enlarged retroperitoneal lymph nodes in porta hepatis, portacaval, aortocaval and left para-aortic chains are all significantly decreased in size, with no residual pathologically enlarged abdominal nodes. No pathologically enlarged lymph nodes in the pelvis. Reproductive: Top-normal size prostate. Other: No pneumoperitoneum, ascites or focal fluid collection. Musculoskeletal: Stable faintly sclerotic expansile left iliac wing lesion (series 2/image 89). Stable small sclerotic superior L5 vertebral lesion. No new focal osseous lesions. Moderate lumbar spondylosis. IMPRESSION: 1. Substantial partial treatment response as detailed below. No new or progressive metastatic disease. 2. Central left upper lobe lung mass is substantially decreased in size. 3. Mediastinal adenopathy is substantially decreased, with residual mild AP window adenopathy. Retroperitoneal adenopathy has resolved. 4. Liver metastases are decreased. 5. Stable faintly sclerotic osseous lesions in the spine and left iliac wing. 6. Stable irregular thickening of the adrenal glands without discrete adrenal nodules. 7.  Aortic Atherosclerosis (ICD10-I70.0). Electronically Signed   By: Ilona Sorrel M.D.   On: 03/03/2019 14:41   Ct Abdomen Pelvis W Contrast  Result Date: 03/03/2019 CLINICAL DATA:  Extensive stage left upper lobe small cell lung cancer status post chemotherapy. Assess treatment response. EXAM: CT CHEST, ABDOMEN, AND PELVIS WITH CONTRAST TECHNIQUE: Multidetector CT imaging of the chest, abdomen and pelvis was performed following the standard protocol during bolus administration of intravenous contrast. CONTRAST:  124mL OMNIPAQUE IOHEXOL 300 MG/ML  SOLN COMPARISON:  12/01/2018 chest CT.  11/29/2018 CT abdomen/pelvis. FINDINGS: CT CHEST FINDINGS  Cardiovascular: Normal heart size. No significant pericardial effusion/thickening. Three-vessel coronary atherosclerosis. Atherosclerotic nonaneurysmal thoracic aorta. Normal caliber pulmonary arteries. No central pulmonary emboli. Mediastinum/Nodes: No discrete thyroid nodules. Unremarkable esophagus. No axillary adenopathy. Mediastinal adenopathy is substantially decreased. Mildly enlarged 1.3 cm AP window node (series 2/image 22), decreased from 3.1 cm using similar measurement technique. No additional residual pathologically enlarged mediastinal nodes. No pathologically enlarged hilar nodes. Lungs/Pleura: No pneumothorax. No pleural effusion. Diffuse bronchial wall thickening. Central left upper lobe lung mass has substantially decreased in size, now 1.6 x 1.3 cm (series 2/image 23), compared to 6.3 x 4.9 cm on 12/01/2018 chest CT using similar measurement technique. No acute consolidative airspace disease or new significant pulmonary nodules. Musculoskeletal: Known osseous metastases throughout the thoracic spine remain relatively occult on CT, with faint sclerosis within some of the lesions, for  example within the T2 and T9 vertebral bodies. No appreciable new osseous lesions in the chest. Moderate thoracic spondylosis. CT ABDOMEN PELVIS FINDINGS Hepatobiliary: Numerous (greater than 10) irregular hypodense liver masses scattered throughout the liver, decreased in size since 11/29/2018 CT abdomen study. Representative inferior right liver lobe 4.5 x 3.0 cm mass (series 2/image 69), decreased from 7.2 x 5.1 cm. Representative segment 7 right liver lobe 4.5 x 3.2 cm mass (series 2/image 57), decreased from 6.7 x 4.8 cm. Representative anterior segment 2 left liver lobe 3.0 x 1.8 cm mass (series 2/image 56), decreased from 5.0 x 2.8 cm. No appreciable new liver lesions. Normal gallbladder with no radiopaque cholelithiasis. No biliary ductal dilatation. Pancreas: Normal, with no mass or duct dilation. Spleen:  Normal size. No mass. Adrenals/Urinary Tract: Stable irregular mild thickening of both adrenal glands without discrete adrenal nodules. No hydronephrosis. Subcentimeter hypodense renal cortical lesions in the interpolar right kidney and upper left kidney are too small to characterize and are stable. No new renal lesions. Normal bladder. Stomach/Bowel: Normal non-distended stomach. Normal caliber small bowel with no small bowel wall thickening. Oral contrast transits to cecum. Normal appendix. Normal large bowel with no diverticulosis, large bowel wall thickening or pericolonic fat stranding. Vascular/Lymphatic: Atherosclerotic nonaneurysmal abdominal aorta. Patent portal, splenic, hepatic and renal veins. Previously visualized enlarged retroperitoneal lymph nodes in porta hepatis, portacaval, aortocaval and left para-aortic chains are all significantly decreased in size, with no residual pathologically enlarged abdominal nodes. No pathologically enlarged lymph nodes in the pelvis. Reproductive: Top-normal size prostate. Other: No pneumoperitoneum, ascites or focal fluid collection. Musculoskeletal: Stable faintly sclerotic expansile left iliac wing lesion (series 2/image 89). Stable small sclerotic superior L5 vertebral lesion. No new focal osseous lesions. Moderate lumbar spondylosis. IMPRESSION: 1. Substantial partial treatment response as detailed below. No new or progressive metastatic disease. 2. Central left upper lobe lung mass is substantially decreased in size. 3. Mediastinal adenopathy is substantially decreased, with residual mild AP window adenopathy. Retroperitoneal adenopathy has resolved. 4. Liver metastases are decreased. 5. Stable faintly sclerotic osseous lesions in the spine and left iliac wing. 6. Stable irregular thickening of the adrenal glands without discrete adrenal nodules. 7.  Aortic Atherosclerosis (ICD10-I70.0). Electronically Signed   By: Ilona Sorrel M.D.   On: 03/03/2019 14:41     ASSESSMENT & PLAN:   63 y.o. male with  1. 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 2. Mild chronic small vessel ischemic disease."   2.  Abnormal liver function tests.  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  Bump in bilirubin could be from progression of his liver mets.   12/18/18 US Abdomen revealed Widespread metastatic disease throughout the liver. The liver has a somewhat nodular contour suggesting underlying cirrhosis. 2. No gallstones seen. Gallbladder wall appears mildly thickened and edematous. Sludge is noted in the gallbladder. There is also prominence of the common bile duct without mass or calculus evident. These findings raise concern for potential degree of acalculus cholecystitis. In this regard, it may be prudent to consider nuclear medicine hepatobiliary imaging study to assess for cystic duct patency.  01/02/19 NM Hepatobiliary including GB revealed No biliary obstruction. 2. Normal hepatobiliary scan.  S/p 4 cycles of Carboplatin AUC of 5, 50mg /m2 Etoposide, and Atezolizumab at this time.  PLAN: -Discussed pt labwork today, 03/09/19; blood counts and chemistries are stable, Magnesium normal at 1.7 -Discussed the 03/03/19  CT C/A/P which revealed "Substantial partial treatment response as detailed below. No new or progressive metastatic disease. 2. Central left upper lobe lung mass is substantially decreased in size. 3. Mediastinal adenopathy is substantially decreased, with residual mild AP window adenopathy. Retroperitoneal adenopathy has resolved. 4. Liver metastases are decreased. 5. Stable faintly sclerotic osseous lesions in the spine and left iliac wing. 6. Stable irregular thickening of the adrenal glands without discrete adrenal nodules. 7. Aortic Atherosclerosis." -Discussed again the NCCN guidelines of 4-6  cycles of treatment, followed by maintenance immunotherapy. Pt's disease has responded well, and discussed risks vs benefits of completing 1-2 additional cycles (pt has completed 4 cycles thus far), in setting of patient's liver cirrhosis and palliative treatment. Discussed patient's goals of care. Patient's goals are to stop chemotherapy treatment at this time, and he will focus on spending quality time with friends and family. -The pt has no prohibitive toxicities from beginning maintenance Atezolizumab every 3 weeks, at this time. -Will continue Atezolizumab every 3 weeks until progression or intolerance -Will repeat scans in 3-4 months, sooner if symptoms develop -Continue Xgeva every 4 weeks for bone strengthening, denies dental pains, discussed risk of non-healing ulcers -Discussed pain management strategies again with the pt: 1-2 short acting Oxycodone every 4-6 hours as needed. Continue long acting Oxycontin. -Recommended that the pt continue to eat well, drink at least 48-64 oz of water each day, and walk 20-30 minutes each day. -Will see the pt back in 3 weeks   -Plz schedule next 3 cycles of maintenance Atezolizumab with labs and MD visit as ordered -plz remove treatment appointment tomorrow 6/16.   All of the patients questions were answered with apparent satisfaction. The patient knows to call the clinic with any problems, questions or concerns.  The total time spent in the appt was 25 minutes and more than 50% was on counseling and direct patient cares.    Sullivan Lone MD [place] AAHIVMS [facility] Hematology/Oncology Physician [facility]  (Office):       [phone] (Work cell):  [phone] (Fax):           [phone]  I, Baldwin [place], am acting as a scribe for Dr. Sullivan Lone.   .I have reviewed the above documentation for accuracy and completeness, and I agree with the above. Brunetta Genera MD

## 2019-03-06 NOTE — Telephone Encounter (Signed)
Opened in error

## 2019-03-09 ENCOUNTER — Inpatient Hospital Stay (HOSPITAL_BASED_OUTPATIENT_CLINIC_OR_DEPARTMENT_OTHER): Payer: Self-pay | Admitting: Hematology

## 2019-03-09 ENCOUNTER — Other Ambulatory Visit: Payer: Self-pay

## 2019-03-09 ENCOUNTER — Inpatient Hospital Stay: Payer: Self-pay

## 2019-03-09 ENCOUNTER — Inpatient Hospital Stay: Payer: Self-pay | Attending: Hematology

## 2019-03-09 VITALS — BP 141/81 | HR 70 | Temp 97.7°F | Resp 18 | Ht 70.0 in | Wt 181.4 lb

## 2019-03-09 DIAGNOSIS — C3490 Malignant neoplasm of unspecified part of unspecified bronchus or lung: Secondary | ICD-10-CM

## 2019-03-09 DIAGNOSIS — Z5112 Encounter for antineoplastic immunotherapy: Secondary | ICD-10-CM

## 2019-03-09 DIAGNOSIS — Z5111 Encounter for antineoplastic chemotherapy: Secondary | ICD-10-CM

## 2019-03-09 DIAGNOSIS — Z720 Tobacco use: Secondary | ICD-10-CM

## 2019-03-09 DIAGNOSIS — C787 Secondary malignant neoplasm of liver and intrahepatic bile duct: Secondary | ICD-10-CM

## 2019-03-09 DIAGNOSIS — M545 Low back pain: Secondary | ICD-10-CM

## 2019-03-09 DIAGNOSIS — C7951 Secondary malignant neoplasm of bone: Secondary | ICD-10-CM

## 2019-03-09 DIAGNOSIS — Z7189 Other specified counseling: Secondary | ICD-10-CM

## 2019-03-09 LAB — CBC WITH DIFFERENTIAL/PLATELET
Abs Immature Granulocytes: 0.03 10*3/uL (ref 0.00–0.07)
Basophils Absolute: 0 10*3/uL (ref 0.0–0.1)
Basophils Relative: 0 %
Eosinophils Absolute: 0.1 10*3/uL (ref 0.0–0.5)
Eosinophils Relative: 1 %
HCT: 41.7 % (ref 39.0–52.0)
Hemoglobin: 13.6 g/dL (ref 13.0–17.0)
Immature Granulocytes: 0 %
Lymphocytes Relative: 24 %
Lymphs Abs: 1.9 10*3/uL (ref 0.7–4.0)
MCH: 33.2 pg (ref 26.0–34.0)
MCHC: 32.6 g/dL (ref 30.0–36.0)
MCV: 101.7 fL — ABNORMAL HIGH (ref 80.0–100.0)
Monocytes Absolute: 0.8 10*3/uL (ref 0.1–1.0)
Monocytes Relative: 10 %
Neutro Abs: 5.2 10*3/uL (ref 1.7–7.7)
Neutrophils Relative %: 65 %
Platelets: 212 10*3/uL (ref 150–400)
RBC: 4.1 MIL/uL — ABNORMAL LOW (ref 4.22–5.81)
RDW: 14.2 % (ref 11.5–15.5)
WBC: 8 10*3/uL (ref 4.0–10.5)
nRBC: 0 % (ref 0.0–0.2)

## 2019-03-09 LAB — CMP (CANCER CENTER ONLY)
ALT: 15 U/L (ref 0–44)
AST: 14 U/L — ABNORMAL LOW (ref 15–41)
Albumin: 3.6 g/dL (ref 3.5–5.0)
Alkaline Phosphatase: 96 U/L (ref 38–126)
Anion gap: 9 (ref 5–15)
BUN: 10 mg/dL (ref 8–23)
CO2: 21 mmol/L — ABNORMAL LOW (ref 22–32)
Calcium: 9.1 mg/dL (ref 8.9–10.3)
Chloride: 108 mmol/L (ref 98–111)
Creatinine: 0.65 mg/dL (ref 0.61–1.24)
GFR, Est AFR Am: >60 mL/min (ref >60)
GFR, Estimated: >60 mL/min (ref >60)
Glucose, Bld: 88 mg/dL (ref 70–99)
Potassium: 4 mmol/L (ref 3.5–5.1)
Sodium: 138 mmol/L (ref 135–145)
Total Bilirubin: 0.4 mg/dL (ref 0.3–1.2)
Total Protein: 7.3 g/dL (ref 6.5–8.1)

## 2019-03-09 LAB — MAGNESIUM: Magnesium: 1.7 mg/dL (ref 1.7–2.4)

## 2019-03-09 MED ORDER — FAMOTIDINE 20 MG PO TABS
20.0000 mg | ORAL_TABLET | Freq: Once | ORAL | Status: AC
  Administered 2019-03-09: 20 mg via ORAL

## 2019-03-09 MED ORDER — SODIUM CHLORIDE 0.9 % IV SOLN
Freq: Once | INTRAVENOUS | Status: AC
  Administered 2019-03-09: 11:00:00 via INTRAVENOUS
  Filled 2019-03-09: qty 250

## 2019-03-09 MED ORDER — DIPHENHYDRAMINE HCL 25 MG PO CAPS
ORAL_CAPSULE | ORAL | Status: AC
  Filled 2019-03-09: qty 1

## 2019-03-09 MED ORDER — SODIUM CHLORIDE 0.9 % IV SOLN
1200.0000 mg | Freq: Once | INTRAVENOUS | Status: AC
  Administered 2019-03-09: 1200 mg via INTRAVENOUS
  Filled 2019-03-09: qty 20

## 2019-03-09 MED ORDER — DIPHENHYDRAMINE HCL 25 MG PO CAPS
25.0000 mg | ORAL_CAPSULE | Freq: Once | ORAL | Status: AC
  Administered 2019-03-09: 25 mg via ORAL

## 2019-03-09 MED ORDER — ACETAMINOPHEN 325 MG PO TABS
ORAL_TABLET | ORAL | Status: AC
  Filled 2019-03-09: qty 2

## 2019-03-09 MED ORDER — FAMOTIDINE 20 MG PO TABS
ORAL_TABLET | ORAL | Status: AC
  Filled 2019-03-09: qty 1

## 2019-03-09 MED ORDER — ACETAMINOPHEN 325 MG PO TABS
650.0000 mg | ORAL_TABLET | Freq: Once | ORAL | Status: AC
  Administered 2019-03-09: 650 mg via ORAL

## 2019-03-09 MED ORDER — FAMOTIDINE IN NACL 20-0.9 MG/50ML-% IV SOLN
INTRAVENOUS | Status: AC
  Filled 2019-03-09: qty 50

## 2019-03-09 NOTE — Patient Instructions (Signed)
Atezolizumab injection What is this medicine? ATEZOLIZUMAB (a te zoe LIZ ue mab) is a monoclonal antibody. It is used to treat bladder cancer (urothelial cancer), non-small cell lung cancer, small cell lung cancer, and breast cancer. This medicine may be used for other purposes; ask your health care provider or pharmacist if you have questions. COMMON BRAND NAME(S): Tecentriq What should I tell my health care provider before I take this medicine? They need to know if you have any of these conditions: -diabetes -immune system problems -infection -inflammatory bowel disease -liver disease -lung or breathing disease -lupus -nervous system problems like myasthenia gravis or Guillain-Barre syndrome -organ transplant -an unusual or allergic reaction to atezolizumab, other medicines, foods, dyes, or preservatives -pregnant or trying to get pregnant -breast-feeding How should I use this medicine? This medicine is for infusion into a vein. It is given by a health care professional in a hospital or clinic setting. A special MedGuide will be given to you before each treatment. Be sure to read this information carefully each time. Talk to your pediatrician regarding the use of this medicine in children. Special care may be needed. Overdosage: If you think you have taken too much of this medicine contact a poison control center or emergency room at once. NOTE: This medicine is only for you. Do not share this medicine with others. What if I miss a dose? It is important not to miss your dose. Call your doctor or health care professional if you are unable to keep an appointment. What may interact with this medicine? Interactions have not been studied. This list may not describe all possible interactions. Give your health care provider a list of all the medicines, herbs, non-prescription drugs, or dietary supplements you use. Also tell them if you smoke, drink alcohol, or use illegal drugs. Some items  may interact with your medicine. What should I watch for while using this medicine? Your condition will be monitored carefully while you are receiving this medicine. You may need blood work done while you are taking this medicine. Do not become pregnant while taking this medicine or for at least 5 months after stopping it. Women should inform their doctor if they wish to become pregnant or think they might be pregnant. There is a potential for serious side effects to an unborn child. Talk to your health care professional or pharmacist for more information. Do not breast-feed an infant while taking this medicine or for at least 5 months after the last dose. What side effects may I notice from receiving this medicine? Side effects that you should report to your doctor or health care professional as soon as possible: -allergic reactions like skin rash, itching or hives, swelling of the face, lips, or tongue -black, tarry stools -bloody or watery diarrhea -breathing problems -changes in vision -chest pain or chest tightness -chills -facial flushing -fever -headache -signs and symptoms of high blood sugar such as dizziness; dry mouth; dry skin; fruity breath; nausea; stomach pain; increased hunger or thirst; increased urination -signs and symptoms of liver injury like dark yellow or brown urine; general ill feeling or flu-like symptoms; light-colored stools; loss of appetite; nausea; right upper belly pain; unusually weak or tired; yellowing of the eyes or skin -stomach pain -trouble passing urine or change in the amount of urine Side effects that usually do not require medical attention (report to your doctor or health care professional if they continue or are bothersome): -cough -diarrhea -joint pain -muscle pain -muscle weakness -tiredness -weight loss   This list may not describe all possible side effects. Call your doctor for medical advice about side effects. You may report side effects  to FDA at 1-800-FDA-1088. Where should I keep my medicine? This drug is given in a hospital or clinic and will not be stored at home. NOTE: This sheet is a summary. It may not cover all possible information. If you have questions about this medicine, talk to your doctor, pharmacist, or health care provider.  2019 [employer] Standard (2017-12-13 09:33:38)  

## 2019-03-10 ENCOUNTER — Ambulatory Visit: Payer: Self-pay

## 2019-03-11 ENCOUNTER — Telehealth: Payer: Self-pay | Admitting: Hematology

## 2019-03-11 NOTE — Telephone Encounter (Signed)
Scheduled appts per 6/15 los. Treatment plan not updated yet.   Treatments will be added and I will contact patient with appt date and time when it has been added.

## 2019-03-25 ENCOUNTER — Other Ambulatory Visit: Payer: Self-pay | Admitting: *Deleted

## 2019-03-25 MED ORDER — OXYCODONE HCL 5 MG PO TABS: 5.0000 mg | ORAL_TABLET | Freq: Four times a day (QID) | ORAL | 0 refills | Status: DC | PRN

## 2019-03-25 NOTE — Telephone Encounter (Signed)
Called and requested refill of Oxy IR 5 mg. States it should go to [facility] on [address]. He is out of town and will pick up on Monday

## 2019-03-26 ENCOUNTER — Other Ambulatory Visit: Payer: Self-pay | Admitting: *Deleted

## 2019-03-26 MED ORDER — OXYCODONE HCL ER 10 MG PO T12A: 10.0000 mg | EXTENDED_RELEASE_TABLET | Freq: Two times a day (BID) | ORAL | 0 refills | Status: DC

## 2019-03-26 NOTE — Telephone Encounter (Signed)
Patient called. Will need refill of Oxycontin 10 mg, 12 hr tablet (generic) sent to [facility] by Monday.

## 2019-03-26 NOTE — Progress Notes (Signed)
HEMATOLOGY/ONCOLOGY CLINIC NOTE  Date of Service: 03/30/2019  Patient Care Team: Default, Provider, MD as PCP - General  CHIEF COMPLAINTS/PURPOSE OF CONSULTATION:  Recently Diagnosed Small Cell Lung Cancer  HISTORY OF PRESENTING ILLNESS:  Cody Montoya a 63 y.o.malewith medical history significant ofalcohol abuse.He does not have a primary care provider and does not seek routine medical care. Around 3 weeks ago, the patient started having epigastric abdominal pain. It would wax and wane in severity but progressively worsenedsince onset. He has had associated nausea and vomiting. His appetite has decreased and he is lost around 10 pounds. He stopped drinking when this happened. He would drink around 7-8 shots of vodka daily in addition to the 2-3 beers. He has done this for the past 4-5 years. He does not have any known history of hepatitis B or C and denies taking any medication/supplements routinely. He denies any prominent vessels, yellowing of the skin/eyes, fevers, shortness of breath, or bowel changes. The patient has been using ibuprofen for pain with little relief.  In the emergency room, a CT scan showsmultifocal hepatocellular carcinoma that is not appear to be metastatic to the liver. There is also thoracic adenopathy,omental/peritoneal involvement and left pleural effusion suggestive of metastatic disease. The oncology team was called and recommended admission for work-up.   When seen today, the patient reports that he continues to have abdominal discomfort in his upper abdomen.  Along with the discomfort in his abdomen, he also reports nausea and vomiting and increased constipation.  This is been present for about 2 to 3 weeks.  He states his appetite has been decreased and he has lost weight.  He estimates that he has lost about 10 pounds over the past few months.  Oncology was asked to see the patient to make recommendations regarding his liver  masses.  Interval History:   Cody Montoya is here for follow-up of his extensive stage small cell lung cancer. The patient's last visit with [place] was on 03/09/19. The pt reports that he is doing well overall.  The pt reports that he has continued having lots of joy fishing in the interim. He notes that he is eating well and has gained four pounds in the interim. He has continued on long acting pain medication BID and notes that he is using "a couple of the short acting per day on average." He notes that his pain is mostly concentrated in the center of his back. He denies concerns for infections at this time, and denies abdominal pains and leg swelling.  Lab results today (03/30/19) of CBC w/diff and CMP is as follows: all values are WNL except for RBC at 4.21. 03/30/19 Magnesium at 1.7 03/30/19 TSH and T4 are wnl  On review of systems, pt reports good energy levels, eating well, mild weight gain, controlled mid-back pain, and denies concerns for infections, abdominal pains, leg swelling, and any other symptoms.   MEDICAL HISTORY:  Past Medical History:  Diagnosis Date   Cancer [facility])     SURGICAL HISTORY: Past Surgical History:  Procedure Laterality Date   NO PAST SURGERIES      SOCIAL HISTORY: Social History   Socioeconomic History   Marital status: Single    Spouse name: Not on file   Number of children: Not on file   Years of education: Not on file   Highest education level: Not on file  Occupational History   Occupation: Surveyor, quantity  Social Needs   Financial resource strain:  Not very hard   Food insecurity    Worry: Never true    Inability: Never true   Transportation needs    Medical: No    Non-medical: No  Tobacco Use   Smoking status: Current Every Day Smoker    Packs/day: 1.00    Years: 45.00    Pack years: 45.00    Types: Cigarettes  Substance and Sexual Activity   Alcohol use: Yes    Alcohol/week: 8.0 standard drinks    Types: 3 Cans  of beer, 5 Shots of liquor per week    Comment: no alcohol in past month due to symptoms   Drug use: Not Currently    Frequency: 7.0 times per week    Types: Marijuana   Sexual activity: Not Currently  Lifestyle   Physical activity    Days per week: 5 days    Minutes per session: 150+ min   Stress: To some extent  Relationships   Social connections    Talks on phone: More than three times a week    Gets together: More than three times a week    Attends religious service: Never    Active member of club or organization: No    Attends meetings of clubs or organizations: Never    Relationship status: Living with partner   Intimate partner violence    Fear of current or ex partner: No    Emotionally abused: No    Physically abused: No    Forced sexual activity: No  Other Topics Concern   Not on file  Social History Narrative   Not on file    FAMILY HISTORY: No family history on file.  ALLERGIES:  has No Known Allergies.  MEDICATIONS:  Current Outpatient Medications  Medication Sig Dispense Refill   docusate sodium (COLACE) 100 MG capsule Take 1 capsule (100 mg total) by mouth 2 (two) times daily. 10 capsule 0   ibuprofen (ADVIL,MOTRIN) 200 MG tablet Take 800 mg by mouth every 6 (six) hours as needed for fever or moderate pain.     ondansetron (ZOFRAN) 8 MG tablet Take 1 tablet (8 mg total) by mouth 2 (two) times daily as needed for refractory nausea / vomiting. Start on day 3 after carboplatin chemo. 30 tablet 1   oxyCODONE (OXY IR/ROXICODONE) 5 MG immediate release tablet Take 1-2 tablets (5-10 mg total) by mouth every 6 (six) hours as needed for moderate pain. 120 tablet 0   oxyCODONE (OXYCONTIN) 10 mg 12 hr tablet Take 1 tablet (10 mg total) by mouth every 12 (twelve) hours. 60 tablet 0   polyethylene glycol (MIRALAX / GLYCOLAX) packet Take 17 g by mouth daily as needed for mild constipation. 14 each 0   No current facility-administered medications for this  visit.    Facility-Administered Medications Ordered in Other Visits  Medication Dose Route Frequency Provider Last Rate Last Dose   oxyCODONE (Oxy IR/ROXICODONE) immediate release tablet 5 mg  5 mg Oral Once Brunetta Genera, MD        REVIEW OF SYSTEMS:    A 10+ POINT REVIEW OF SYSTEMS WAS OBTAINED including neurology, dermatology, psychiatry, cardiac, respiratory, lymph, extremities, GI, GU, Musculoskeletal, constitutional, breasts, reproductive, HEENT.  All pertinent positives are noted in the HPI.  All others are negative.   PHYSICAL EXAMINATION: ECOG PERFORMANCE STATUS: 1-2 Vital signs reviewed in epic  GENERAL:alert, in no acute distress and comfortable SKIN: no acute rashes, no significant lesions EYES: conjunctiva are pink and non-injected, sclera anicteric  OROPHARYNX: MMM, no exudates, no oropharyngeal erythema or ulceration NECK: supple, no JVD LYMPH:  no palpable lymphadenopathy in the cervical, axillary or inguinal regions LUNGS: clear to auscultation b/l with normal respiratory effort HEART: regular rate & rhythm ABDOMEN: Improved hepatomegaly about 1-2 fingerbreadths below costal line, no TTP, and negative Murphy's sign Extremity: no pedal edema PSYCH: alert & oriented x 3 with fluent speech NEURO: no focal motor/sensory deficits   LABORATORY DATA:  I have reviewed the data as listed  . CBC Latest Ref Rng & Units 03/30/2019 03/09/2019 02/17/2019  WBC 4.0 - 10.5 K/uL 5.8 8.0 9.4  Hemoglobin 13.0 - 17.0 g/dL 14.0 13.6 13.0  Hematocrit 39.0 - 52.0 % 41.0 41.7 39.8  Platelets 150 - 400 K/uL 218 212 245    . CMP Latest Ref Rng & Units 03/30/2019 03/09/2019 02/17/2019  Glucose 70 - 99 mg/dL 96 88 102(H)  BUN 8 - 23 mg/dL 8 10 12   Creatinine 0.61 - 1.24 mg/dL 0.66 0.65 0.68  Sodium 135 - 145 mmol/L 138 138 135  Potassium 3.5 - 5.1 mmol/L 4.0 4.0 4.5  Chloride 98 - 111 mmol/L 107 108 104  CO2 22 - 32 mmol/L 23 21(L) 24  Calcium 8.9 - 10.3 mg/dL 9.0 9.1 9.2  Total  Protein 6.5 - 8.1 g/dL 7.4 7.3 7.5  Total Bilirubin 0.3 - 1.2 mg/dL 0.4 0.4 0.5  Alkaline Phos 38 - 126 U/L 78 96 112  AST 15 - 41 U/L 19 14(L) 16  ALT 0 - 44 U/L 17 15 17        RADIOGRAPHIC STUDIES: I have personally reviewed the radiological images as listed and agreed with the findings in the report. Ct Chest W Contrast  Result Date: 03/03/2019 CLINICAL DATA:  Extensive stage left upper lobe small cell lung cancer status post chemotherapy. Assess treatment response. EXAM: CT CHEST, ABDOMEN, AND PELVIS WITH CONTRAST TECHNIQUE: Multidetector CT imaging of the chest, abdomen and pelvis was performed following the standard protocol during bolus administration of intravenous contrast. CONTRAST:  152mL OMNIPAQUE IOHEXOL 300 MG/ML  SOLN COMPARISON:  12/01/2018 chest CT.  11/29/2018 CT abdomen/pelvis. FINDINGS: CT CHEST FINDINGS Cardiovascular: Normal heart size. No significant pericardial effusion/thickening. Three-vessel coronary atherosclerosis. Atherosclerotic nonaneurysmal thoracic aorta. Normal caliber pulmonary arteries. No central pulmonary emboli. Mediastinum/Nodes: No discrete thyroid nodules. Unremarkable esophagus. No axillary adenopathy. Mediastinal adenopathy is substantially decreased. Mildly enlarged 1.3 cm AP window node (series 2/image 22), decreased from 3.1 cm using similar measurement technique. No additional residual pathologically enlarged mediastinal nodes. No pathologically enlarged hilar nodes. Lungs/Pleura: No pneumothorax. No pleural effusion. Diffuse bronchial wall thickening. Central left upper lobe lung mass has substantially decreased in size, now 1.6 x 1.3 cm (series 2/image 23), compared to 6.3 x 4.9 cm on 12/01/2018 chest CT using similar measurement technique. No acute consolidative airspace disease or new significant pulmonary nodules. Musculoskeletal: Known osseous metastases throughout the thoracic spine remain relatively occult on CT, with faint sclerosis within some  of the lesions, for example within the T2 and T9 vertebral bodies. No appreciable new osseous lesions in the chest. Moderate thoracic spondylosis. CT ABDOMEN PELVIS FINDINGS Hepatobiliary: Numerous (greater than 10) irregular hypodense liver masses scattered throughout the liver, decreased in size since 11/29/2018 CT abdomen study. Representative inferior right liver lobe 4.5 x 3.0 cm mass (series 2/image 69), decreased from 7.2 x 5.1 cm. Representative segment 7 right liver lobe 4.5 x 3.2 cm mass (series 2/image 57), decreased from 6.7 x 4.8 cm. Representative anterior segment 2 left  liver lobe 3.0 x 1.8 cm mass (series 2/image 56), decreased from 5.0 x 2.8 cm. No appreciable new liver lesions. Normal gallbladder with no radiopaque cholelithiasis. No biliary ductal dilatation. Pancreas: Normal, with no mass or duct dilation. Spleen: Normal size. No mass. Adrenals/Urinary Tract: Stable irregular mild thickening of both adrenal glands without discrete adrenal nodules. No hydronephrosis. Subcentimeter hypodense renal cortical lesions in the interpolar right kidney and upper left kidney are too small to characterize and are stable. No new renal lesions. Normal bladder. Stomach/Bowel: Normal non-distended stomach. Normal caliber small bowel with no small bowel wall thickening. Oral contrast transits to cecum. Normal appendix. Normal large bowel with no diverticulosis, large bowel wall thickening or pericolonic fat stranding. Vascular/Lymphatic: Atherosclerotic nonaneurysmal abdominal aorta. Patent portal, splenic, hepatic and renal veins. Previously visualized enlarged retroperitoneal lymph nodes in porta hepatis, portacaval, aortocaval and left para-aortic chains are all significantly decreased in size, with no residual pathologically enlarged abdominal nodes. No pathologically enlarged lymph nodes in the pelvis. Reproductive: Top-normal size prostate. Other: No pneumoperitoneum, ascites or focal fluid collection.  Musculoskeletal: Stable faintly sclerotic expansile left iliac wing lesion (series 2/image 89). Stable small sclerotic superior L5 vertebral lesion. No new focal osseous lesions. Moderate lumbar spondylosis. IMPRESSION: 1. Substantial partial treatment response as detailed below. No new or progressive metastatic disease. 2. Central left upper lobe lung mass is substantially decreased in size. 3. Mediastinal adenopathy is substantially decreased, with residual mild AP window adenopathy. Retroperitoneal adenopathy has resolved. 4. Liver metastases are decreased. 5. Stable faintly sclerotic osseous lesions in the spine and left iliac wing. 6. Stable irregular thickening of the adrenal glands without discrete adrenal nodules. 7.  Aortic Atherosclerosis (ICD10-I70.0). Electronically Signed   By: Ilona Sorrel M.D.   On: 03/03/2019 14:41   Ct Abdomen Pelvis W Contrast  Result Date: 03/03/2019 CLINICAL DATA:  Extensive stage left upper lobe small cell lung cancer status post chemotherapy. Assess treatment response. EXAM: CT CHEST, ABDOMEN, AND PELVIS WITH CONTRAST TECHNIQUE: Multidetector CT imaging of the chest, abdomen and pelvis was performed following the standard protocol during bolus administration of intravenous contrast. CONTRAST:  174mL OMNIPAQUE IOHEXOL 300 MG/ML  SOLN COMPARISON:  12/01/2018 chest CT.  11/29/2018 CT abdomen/pelvis. FINDINGS: CT CHEST FINDINGS Cardiovascular: Normal heart size. No significant pericardial effusion/thickening. Three-vessel coronary atherosclerosis. Atherosclerotic nonaneurysmal thoracic aorta. Normal caliber pulmonary arteries. No central pulmonary emboli. Mediastinum/Nodes: No discrete thyroid nodules. Unremarkable esophagus. No axillary adenopathy. Mediastinal adenopathy is substantially decreased. Mildly enlarged 1.3 cm AP window node (series 2/image 22), decreased from 3.1 cm using similar measurement technique. No additional residual pathologically enlarged mediastinal  nodes. No pathologically enlarged hilar nodes. Lungs/Pleura: No pneumothorax. No pleural effusion. Diffuse bronchial wall thickening. Central left upper lobe lung mass has substantially decreased in size, now 1.6 x 1.3 cm (series 2/image 23), compared to 6.3 x 4.9 cm on 12/01/2018 chest CT using similar measurement technique. No acute consolidative airspace disease or new significant pulmonary nodules. Musculoskeletal: Known osseous metastases throughout the thoracic spine remain relatively occult on CT, with faint sclerosis within some of the lesions, for example within the T2 and T9 vertebral bodies. No appreciable new osseous lesions in the chest. Moderate thoracic spondylosis. CT ABDOMEN PELVIS FINDINGS Hepatobiliary: Numerous (greater than 10) irregular hypodense liver masses scattered throughout the liver, decreased in size since 11/29/2018 CT abdomen study. Representative inferior right liver lobe 4.5 x 3.0 cm mass (series 2/image 69), decreased from 7.2 x 5.1 cm. Representative segment 7 right liver lobe 4.5 x 3.2  cm mass (series 2/image 57), decreased from 6.7 x 4.8 cm. Representative anterior segment 2 left liver lobe 3.0 x 1.8 cm mass (series 2/image 56), decreased from 5.0 x 2.8 cm. No appreciable new liver lesions. Normal gallbladder with no radiopaque cholelithiasis. No biliary ductal dilatation. Pancreas: Normal, with no mass or duct dilation. Spleen: Normal size. No mass. Adrenals/Urinary Tract: Stable irregular mild thickening of both adrenal glands without discrete adrenal nodules. No hydronephrosis. Subcentimeter hypodense renal cortical lesions in the interpolar right kidney and upper left kidney are too small to characterize and are stable. No new renal lesions. Normal bladder. Stomach/Bowel: Normal non-distended stomach. Normal caliber small bowel with no small bowel wall thickening. Oral contrast transits to cecum. Normal appendix. Normal large bowel with no diverticulosis, large bowel wall  thickening or pericolonic fat stranding. Vascular/Lymphatic: Atherosclerotic nonaneurysmal abdominal aorta. Patent portal, splenic, hepatic and renal veins. Previously visualized enlarged retroperitoneal lymph nodes in porta hepatis, portacaval, aortocaval and left para-aortic chains are all significantly decreased in size, with no residual pathologically enlarged abdominal nodes. No pathologically enlarged lymph nodes in the pelvis. Reproductive: Top-normal size prostate. Other: No pneumoperitoneum, ascites or focal fluid collection. Musculoskeletal: Stable faintly sclerotic expansile left iliac wing lesion (series 2/image 89). Stable small sclerotic superior L5 vertebral lesion. No new focal osseous lesions. Moderate lumbar spondylosis. IMPRESSION: 1. Substantial partial treatment response as detailed below. No new or progressive metastatic disease. 2. Central left upper lobe lung mass is substantially decreased in size. 3. Mediastinal adenopathy is substantially decreased, with residual mild AP window adenopathy. Retroperitoneal adenopathy has resolved. 4. Liver metastases are decreased. 5. Stable faintly sclerotic osseous lesions in the spine and left iliac wing. 6. Stable irregular thickening of the adrenal glands without discrete adrenal nodules. 7.  Aortic Atherosclerosis (ICD10-I70.0). Electronically Signed   By: Ilona Sorrel M.D.   On: 03/03/2019 14:41    ASSESSMENT & PLAN:   63 y.o. male with  1. 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 2. Mild chronic small vessel ischemic disease."   2.  Abnormal liver function tests.  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  Bump in bilirubin could be from progression of his liver mets.   12/18/18 US Abdomen revealed Widespread metastatic disease throughout the liver. The liver has a somewhat  nodular contour suggesting underlying cirrhosis. 2. No gallstones seen. Gallbladder wall appears mildly thickened and edematous. Sludge is noted in the gallbladder. There is also prominence of the common bile duct without mass or calculus evident. These findings raise concern for potential degree of acalculus cholecystitis. In this regard, it may be prudent to consider nuclear medicine hepatobiliary imaging study to assess for cystic duct patency.  01/02/19 NM Hepatobiliary including GB revealed No biliary obstruction. 2. Normal hepatobiliary scan.  S/p 4 cycles of Carboplatin AUC of 5, 50mg /m2 Etoposide, and Atezolizumab at this time.  03/03/19 CT C/A/P revealed "Substantial partial treatment response as detailed below. No new or progressive metastatic disease. 2. Central left upper lobe lung mass is substantially decreased in size. 3. Mediastinal adenopathy is substantially decreased, with residual mild AP window adenopathy. Retroperitoneal adenopathy has resolved. 4. Liver metastases are decreased. 5. Stable faintly sclerotic osseous lesions in the spine and left iliac wing. 6. Stable irregular thickening of the adrenal glands without discrete adrenal nodules. 7. Aortic Atherosclerosis."  Previously discussed the NCCN guidelines of 4-6 cycles of treatment, followed by  maintenance immunotherapy. Pt's disease has responded well, and discussed risks vs benefits of completing 1-2 additional cycles (pt has completed 4 cycles thus far), in setting of patient's liver cirrhosis and palliative treatment. Discussed patient's goals of care. Patient's goals are to stop chemotherapy treatment after C4, and will focus on spending quality time with friends and family.  PLAN: -Discussed pt labwork today, 03/30/19; blood counts and chemistries are normal. Magnesium normal at 1.7. -The pt has no prohibitive toxicities from continuing maintenance Atezolizumab ever 3 weeks, at this time. -Discussed that there can be a role  for preventative brain RT to reduce risk of recurrence in the brain. Disease outside of the brain is thus far well controlled. Would not be unreasonable to discuss this further with Rad Onc. Discussed that this can potentially result in worsened memory and other cognitive effects. Pt does have risk of systemic progression, and there may be increased risk of cognitive effects of RT given significant alcohol history. Pt does prefer to discuss this option with Rad Onc, and I will refer him. -Will continue Atezolizumab every 3 weeks until progression or intolerance -Will repeat scans in 3-4 months, sooner if symptoms develop -Continue Xgeva every 4 weeks for bone strengthening, denies dental pains, discussed risk of non-healing ulcers -Discussed pain management strategies again with the pt: 1-2 short acting Oxycodone every 4-6 hours as needed. -Recommended that the pt continue to eat well, drink at least 48-64 oz of water each day, and walk 20-30 minutes each day. -Will see the pt back in 3 weeks   -Plz cancel treatment appointment for 7/7/ -F/u as per scheduled appointments on 7/27 -Radiation oncology referral for discussion regarding pros vs cons of PCI with extensive stage small cell lung cancer and ETOH abuse history.   All of the patients questions were answered with apparent satisfaction. The patient knows to call the clinic with any problems, questions or concerns.  The total time spent in the appt was 25 minutes and more than 50% was on counseling and direct patient cares.    Sullivan Lone MD [place] AAHIVMS [facility] Hematology/Oncology Physician [facility]  (Office):       [phone] (Work cell):  [phone] (Fax):           [phone]  I, Baldwin [place], am acting as a scribe for Dr. Sullivan Lone.   .I have reviewed the above documentation for accuracy and completeness, and I agree with the above. Brunetta Genera MD

## 2019-03-30 ENCOUNTER — Inpatient Hospital Stay: Payer: Self-pay

## 2019-03-30 ENCOUNTER — Telehealth: Payer: Self-pay | Admitting: Hematology

## 2019-03-30 ENCOUNTER — Other Ambulatory Visit: Payer: Self-pay

## 2019-03-30 ENCOUNTER — Inpatient Hospital Stay: Payer: Self-pay | Attending: Hematology

## 2019-03-30 ENCOUNTER — Inpatient Hospital Stay (HOSPITAL_BASED_OUTPATIENT_CLINIC_OR_DEPARTMENT_OTHER): Payer: Self-pay | Admitting: Hematology

## 2019-03-30 VITALS — BP 145/86 | HR 64 | Temp 98.9°F | Resp 18 | Ht 70.0 in | Wt 185.1 lb

## 2019-03-30 DIAGNOSIS — Z79899 Other long term (current) drug therapy: Secondary | ICD-10-CM | POA: Insufficient documentation

## 2019-03-30 DIAGNOSIS — C7951 Secondary malignant neoplasm of bone: Secondary | ICD-10-CM

## 2019-03-30 DIAGNOSIS — M545 Low back pain: Secondary | ICD-10-CM

## 2019-03-30 DIAGNOSIS — C7931 Secondary malignant neoplasm of brain: Secondary | ICD-10-CM | POA: Insufficient documentation

## 2019-03-30 DIAGNOSIS — C3490 Malignant neoplasm of unspecified part of unspecified bronchus or lung: Secondary | ICD-10-CM

## 2019-03-30 DIAGNOSIS — M899 Disorder of bone, unspecified: Secondary | ICD-10-CM

## 2019-03-30 DIAGNOSIS — Z7189 Other specified counseling: Secondary | ICD-10-CM

## 2019-03-30 DIAGNOSIS — C3412 Malignant neoplasm of upper lobe, left bronchus or lung: Secondary | ICD-10-CM | POA: Insufficient documentation

## 2019-03-30 DIAGNOSIS — Z5112 Encounter for antineoplastic immunotherapy: Secondary | ICD-10-CM | POA: Insufficient documentation

## 2019-03-30 DIAGNOSIS — F1011 Alcohol abuse, in remission: Secondary | ICD-10-CM

## 2019-03-30 DIAGNOSIS — C787 Secondary malignant neoplasm of liver and intrahepatic bile duct: Secondary | ICD-10-CM

## 2019-03-30 DIAGNOSIS — Z720 Tobacco use: Secondary | ICD-10-CM

## 2019-03-30 DIAGNOSIS — G893 Neoplasm related pain (acute) (chronic): Secondary | ICD-10-CM

## 2019-03-30 LAB — CMP (CANCER CENTER ONLY)
ALT: 17 U/L (ref 0–44)
AST: 19 U/L (ref 15–41)
Albumin: 3.6 g/dL (ref 3.5–5.0)
Alkaline Phosphatase: 78 U/L (ref 38–126)
Anion gap: 8 (ref 5–15)
BUN: 8 mg/dL (ref 8–23)
CO2: 23 mmol/L (ref 22–32)
Calcium: 9 mg/dL (ref 8.9–10.3)
Chloride: 107 mmol/L (ref 98–111)
Creatinine: 0.66 mg/dL (ref 0.61–1.24)
GFR, Est AFR Am: >60 mL/min (ref >60)
GFR, Estimated: >60 mL/min (ref >60)
Glucose, Bld: 96 mg/dL (ref 70–99)
Potassium: 4 mmol/L (ref 3.5–5.1)
Sodium: 138 mmol/L (ref 135–145)
Total Bilirubin: 0.4 mg/dL (ref 0.3–1.2)
Total Protein: 7.4 g/dL (ref 6.5–8.1)

## 2019-03-30 LAB — CBC WITH DIFFERENTIAL/PLATELET
Abs Immature Granulocytes: 0.01 10*3/uL (ref 0.00–0.07)
Basophils Absolute: 0 10*3/uL (ref 0.0–0.1)
Basophils Relative: 0 %
Eosinophils Absolute: 0.1 10*3/uL (ref 0.0–0.5)
Eosinophils Relative: 2 %
HCT: 41 % (ref 39.0–52.0)
Hemoglobin: 14 g/dL (ref 13.0–17.0)
Immature Granulocytes: 0 %
Lymphocytes Relative: 29 %
Lymphs Abs: 1.7 10*3/uL (ref 0.7–4.0)
MCH: 33.3 pg (ref 26.0–34.0)
MCHC: 34.1 g/dL (ref 30.0–36.0)
MCV: 97.4 fL (ref 80.0–100.0)
Monocytes Absolute: 0.5 10*3/uL (ref 0.1–1.0)
Monocytes Relative: 9 %
Neutro Abs: 3.5 10*3/uL (ref 1.7–7.7)
Neutrophils Relative %: 60 %
Platelets: 218 10*3/uL (ref 150–400)
RBC: 4.21 MIL/uL — ABNORMAL LOW (ref 4.22–5.81)
RDW: 13.1 % (ref 11.5–15.5)
WBC: 5.8 10*3/uL (ref 4.0–10.5)
nRBC: 0 % (ref 0.0–0.2)

## 2019-03-30 LAB — MAGNESIUM: Magnesium: 1.7 mg/dL (ref 1.7–2.4)

## 2019-03-30 LAB — TSH: TSH: 0.678 u[IU]/mL (ref 0.320–4.118)

## 2019-03-30 MED ORDER — OXYCODONE HCL ER 10 MG PO T12A: 10.0000 mg | EXTENDED_RELEASE_TABLET | Freq: Two times a day (BID) | ORAL | 0 refills | Status: DC

## 2019-03-30 MED ORDER — FAMOTIDINE 20 MG PO TABS
ORAL_TABLET | ORAL | Status: AC
  Filled 2019-03-30: qty 1

## 2019-03-30 MED ORDER — DIPHENHYDRAMINE HCL 25 MG PO CAPS
ORAL_CAPSULE | ORAL | Status: AC
  Filled 2019-03-30: qty 1

## 2019-03-30 MED ORDER — ACETAMINOPHEN 325 MG PO TABS
650.0000 mg | ORAL_TABLET | Freq: Once | ORAL | Status: AC
  Administered 2019-03-30: 650 mg via ORAL

## 2019-03-30 MED ORDER — SODIUM CHLORIDE 0.9 % IV SOLN
Freq: Once | INTRAVENOUS | Status: AC
  Administered 2019-03-30: 12:00:00 via INTRAVENOUS
  Filled 2019-03-30: qty 250

## 2019-03-30 MED ORDER — FAMOTIDINE 20 MG PO TABS
20.0000 mg | ORAL_TABLET | Freq: Once | ORAL | Status: AC
  Administered 2019-03-30: 20 mg via ORAL

## 2019-03-30 MED ORDER — OXYCODONE HCL 5 MG PO TABS: 5.0000 mg | ORAL_TABLET | Freq: Four times a day (QID) | ORAL | 0 refills | Status: DC | PRN

## 2019-03-30 MED ORDER — ACETAMINOPHEN 325 MG PO TABS
ORAL_TABLET | ORAL | Status: AC
  Filled 2019-03-30: qty 2

## 2019-03-30 MED ORDER — DENOSUMAB 120 MG/1.7ML ~~LOC~~ SOLN
120.0000 mg | Freq: Once | SUBCUTANEOUS | Status: AC
  Administered 2019-03-30: 120 mg via SUBCUTANEOUS

## 2019-03-30 MED ORDER — SODIUM CHLORIDE 0.9 % IV SOLN
1200.0000 mg | Freq: Once | INTRAVENOUS | Status: AC
  Administered 2019-03-30: 1200 mg via INTRAVENOUS
  Filled 2019-03-30: qty 20

## 2019-03-30 MED ORDER — DENOSUMAB 120 MG/1.7ML ~~LOC~~ SOLN
SUBCUTANEOUS | Status: AC
  Filled 2019-03-30: qty 1.7

## 2019-03-30 MED ORDER — DIPHENHYDRAMINE HCL 25 MG PO TABS
25.0000 mg | ORAL_TABLET | Freq: Once | ORAL | Status: AC
  Administered 2019-03-30: 25 mg via ORAL
  Filled 2019-03-30: qty 1

## 2019-03-30 MED ORDER — FAMOTIDINE IN NACL 20-0.9 MG/50ML-% IV SOLN
INTRAVENOUS | Status: AC
  Filled 2019-03-30: qty 50

## 2019-03-30 NOTE — Patient Instructions (Signed)
[facility] Discharge Instructions for Patients Receiving Chemotherapy  Today you received the following Immunotherapy: Tencentriq and other agent: Xgeva  To help prevent nausea and vomiting after your treatment, we encourage you to take your nausea medication as directed by your MD.   If you develop nausea and vomiting that is not controlled by your nausea medication, call the clinic.   BELOW ARE SYMPTOMS THAT SHOULD BE REPORTED IMMEDIATELY:  *FEVER GREATER THAN 100.5 F  *CHILLS WITH OR WITHOUT FEVER  NAUSEA AND VOMITING THAT IS NOT CONTROLLED WITH YOUR NAUSEA MEDICATION  *UNUSUAL SHORTNESS OF BREATH  *UNUSUAL BRUISING OR BLEEDING  TENDERNESS IN MOUTH AND THROAT WITH OR WITHOUT PRESENCE OF ULCERS  *URINARY PROBLEMS  *BOWEL PROBLEMS  UNUSUAL RASH Items with * indicate a potential emergency and should be followed up as soon as possible.  Feel free to call the clinic should you have any questions or concerns. The clinic phone number is [phone].  Please show the [place] at check-in to the Emergency Department and triage nurse.  Coronavirus (COVID-19) Are you at risk?  Are you at risk for the Coronavirus (COVID-19)?  To be considered HIGH RISK for Coronavirus (COVID-19), you have to meet the following criteria:  . Traveled to [place], [place], [place], [place] or [place]; or in the [place] to [place], [place], [place], or [place]; and have fever, cough, and shortness of breath within the last 2 weeks of travel OR . Been in close contact with a person diagnosed with COVID-19 within the last 2 weeks and have fever, cough, and shortness of breath . IF YOU DO NOT MEET THESE CRITERIA, YOU ARE CONSIDERED LOW RISK FOR COVID-19.  What to do if you are HIGH RISK for COVID-19?  Marland Kitchen If you are having a medical emergency, call 911. . Seek medical care right away. Before you go to a doctor's office, urgent care or emergency department, call ahead  and tell them about your recent travel, contact with someone diagnosed with COVID-19, and your symptoms. You should receive instructions from your physician's office regarding next steps of care.  . When you arrive at healthcare provider, tell the healthcare staff immediately you have returned from visiting [place], [place], [place], [place] or [place]; or traveled in the [place] to [place], [place], [place], or [place]; in the last two weeks or you have been in close contact with a person diagnosed with COVID-19 in the last 2 weeks.   . Tell the health care staff about your symptoms: fever, cough and shortness of breath. . After you have been seen by a medical provider, you will be either: o Tested for (COVID-19) and discharged home on quarantine except to seek medical care if symptoms worsen, and asked to  - Stay home and avoid contact with others until you get your results (4-5 days)  - Avoid travel on public transportation if possible (such as bus, train, or airplane) or o Sent to the Emergency Department by EMS for evaluation, COVID-19 testing, and possible admission depending on your condition and test results.  What to do if you are LOW RISK for COVID-19?  Reduce your risk of any infection by using the same precautions used for avoiding the common cold or flu:  Marland Kitchen Wash your hands often with soap and warm water for at least 20 seconds.  If soap and water are not readily available, use an alcohol-based hand sanitizer with at least 60% alcohol.  Marland Kitchen  If coughing or sneezing, cover your mouth and nose by coughing or sneezing into the elbow areas of your shirt or coat, into a tissue or into your sleeve (not your hands). . Avoid shaking hands with others and consider head nods or verbal greetings only. . Avoid touching your eyes, nose, or mouth with unwashed hands.  . Avoid close contact with people who are sick. . Avoid places or events with large numbers of people in one location, like  concerts or sporting events. . Carefully consider travel plans you have or are making. . If you are planning any travel outside or inside the [place], visit the CDC's Travelers' Health webpage for the latest health notices. . If you have some symptoms but not all symptoms, continue to monitor at home and seek medical attention if your symptoms worsen. . If you are having a medical emergency, call 911.   [place] / e-Visit: [email or web address]         [facility]: [facility]: [phone]                   [facility]: [phone]

## 2019-03-30 NOTE — Telephone Encounter (Signed)
Scheduled appt per 7/6 los.

## 2019-03-31 ENCOUNTER — Ambulatory Visit: Payer: Self-pay

## 2019-03-31 LAB — T4: T4, Total: 7.5 ug/dL (ref 4.5–12.0)

## 2019-04-01 NOTE — Progress Notes (Signed)
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  It would wax and wane in severity.  Decreased appetite, lost about 10 pounds.  CT CAP 03/03/2019: Substantial partial treatment response.  No new or progressive metastatic disease.  Central left upper lobe lung mass is substantially decreased in size.  Mediastinal adenopathy is substantially decreased with residual mild AP window adenopathy.  Retroperitoneal adenopathy has resolved.  Liver metastases are decreased.  Stable faintly sclerotic osseous lesions in the spine and left iliac wing.  Stable irregular thickening of the adrenal glands without discrete adrenal nodules.  CT Head 01/13/2019: No evidence of intracranial metastatic disease on this un-enhanced study.  Mild chronic small vessel ischemic disease.  CT Chest 12/01/2018: Large left upper lobe lung mass is identified with invasion of the mediastinum and left hilum. Associated left-sided pleural metastasis with pleural nodularity and effusion noted. Ipsilateral hilar and bilateral mediastinal nodal metastasis. Left supraclavicular metastasis also noted.  Lytic lesion involving the posterior aspect of the approximate T11 vertebra with suspected epidural tumor involvement noted.  Biopsies of   Tobacco/Marijuana/Snuff/ETOH use: Current Smoker.  Past/Anticipated interventions by medical oncology, if any:  Dr. Irene Limbo 03/30/2019 -Radiation oncology referral for discussion regarding pros vs cons of PCI with extensive stage small cell lung cancer and ETOH abuse history. - Discussed that there can be a role for preventative brain RT to reduce risk of recurrence in the brain.  Disease outside of the brain is thus far well controlled. -Would not be unreasonable to discuss this further with Rad Onc. -Discussed that this can potentially result in worsened memory and  other cognitive effects.  Pt does have risk of systemic progression, and there may be increased risk of cognitive effects of RT given significant alcohol history.  Pt prefers to discuss this option with RadOnc. -Previously discussed the NCCN guidelines of 4-6 cycles of treatment, followed by maintenance immunotherapy. Pt's disease has responded well, and discussed risks vs benefits of completing 1-2 additional cycles (pt has completed 4 cycles thus far), in setting of patient's liver cirrhosis and palliative treatment. -Discussed patient's goals of care. Patient's goals are to stop chemotherapy treatment after C4, and will focus on spending quality time with friends and family. 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  There is also thoracic adenopathy, omental/peritoneal involvement and left pleural effusion suggestive of metastatic disease. -Chemo start 12/15/2018 -Chemo stopped  -On immunotherapy, plans for repeat scans in a couple months.  Signs/Symptoms  Weight changes, if any: No changes  Pain issues, if any:  Some pain in mid-back between shoulder blades, taking pain medication-oxycodone 5mg  with relief.  SAFETY ISSUES:  Prior radiation? No  Pacemaker/ICD? No  Possible current pregnancy? N/A  Is the patient on methotrexate? No  Current Complaints / other details:

## 2019-04-02 ENCOUNTER — Encounter: Payer: Self-pay | Admitting: Radiation Oncology

## 2019-04-02 ENCOUNTER — Other Ambulatory Visit: Payer: Self-pay

## 2019-04-02 ENCOUNTER — Ambulatory Visit
Admission: RE | Admit: 2019-04-02 | Discharge: 2019-04-02 | Disposition: A | Discharge status: Home / Self Care | Payer: Self-pay | Source: Ambulatory Visit | Attending: Radiation Oncology | Admitting: Radiation Oncology

## 2019-04-02 VITALS — Ht 70.0 in | Wt 185.0 lb

## 2019-04-02 DIAGNOSIS — C3490 Malignant neoplasm of unspecified part of unspecified bronchus or lung: Secondary | ICD-10-CM

## 2019-04-02 DIAGNOSIS — C220 Liver cell carcinoma: Secondary | ICD-10-CM

## 2019-04-02 MED ORDER — LORAZEPAM 0.5 MG PO TABS: ORAL_TABLET | ORAL | 0 refills | Status: DC

## 2019-04-02 MED ORDER — MEMANTINE HCL 5 MG PO TABS: ORAL_TABLET | ORAL | 3 refills | Status: AC

## 2019-04-02 NOTE — Progress Notes (Addendum)
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 Diagnoses of Hepatocellular carcinoma ([place]) and Malignant neoplasm of unspecified part of unspecified bronchus or lung ([place]) were also pertinent to this visit.   HISTORY OF PRESENT ILLNESS: Cody Montoya is a 63 y.o. male seen at the request of Dr. Irene Limbo for a history of extensive stage small cell carcinoma of the lung. He was originally diagnosed with extensive stage disease after a liver biopsy on 12/01/18 revealed small cell carcinoma consistent with lung primary. He was unable to tolerate MRI brain and a CT head revealed no disease. He did have concerns for bony disease as well in the spine around T10. He began carboplatin/etoposide and completed  4 cycles of this and then transitioned to [place] which he has completed 4 cycles of on 02/19/2019. He continues on Atezolizumab and received this last on 03/30/2019. He has had a nice response to treatment and is seen today to discuss options of prophylactic cranial irradiation (PCI) to reduce the risk of CNS disease.     PREVIOUS RADIATION THERAPY: No   PAST MEDICAL HISTORY:  Past Medical History:  Diagnosis Date  . Cancer [facility])        PAST SURGICAL HISTORY: Past Surgical History:  Procedure Laterality Date  . NO PAST SURGERIES       FAMILY HISTORY:  Family History  Problem Relation Age of Onset  . Breast cancer Sister   . Breast cancer Sister      SOCIAL HISTORY:  reports that he has been smoking cigarettes. He has a 45.00 pack-year smoking history. He has never used smokeless tobacco. He reports previous alcohol use of about 8.0 standard  drinks of alcohol per week. He reports previous drug use. Frequency: 7.00 times per week. Drug: Marijuana. The patient is single and is accompanied on the call by his sisters.    ALLERGIES: Patient has no known allergies.   MEDICATIONS:  Current Outpatient Medications  Medication Sig Dispense Refill  . docusate sodium (COLACE) 100 MG capsule Take 1 capsule (100 mg total) by mouth 2 (two) times daily. 10 capsule 0  . ibuprofen (ADVIL,MOTRIN) 200 MG tablet Take 800 mg by mouth every 6 (six) hours as needed for fever or moderate pain.    Marland Kitchen ondansetron (ZOFRAN) 8 MG tablet Take 1 tablet (8 mg total) by mouth 2 (two) times daily as needed for refractory nausea / vomiting. Start on day 3 after carboplatin chemo. 30 tablet 1  . oxyCODONE (OXY IR/ROXICODONE) 5 MG immediate release tablet Take 1-2 tablets (5-10 mg total) by mouth every 6 (six) hours as needed for moderate pain. 120 tablet 0  . oxyCODONE (OXYCONTIN) 10 mg 12 hr tablet Take 1 tablet (10 mg total) by mouth every 12 (twelve) hours. 60 tablet 0  . polyethylene glycol (MIRALAX / GLYCOLAX) packet Take 17 g by mouth daily as needed for mild constipation. 14 each 0  . LORazepam (ATIVAN) 0.5 MG tablet 1 tab po q 4-6 hours prn or 1 tab po 30 minutes prior to MRI or radiation 30 tablet 0   No current facility-administered medications for this encounter.  Facility-Administered Medications Ordered in Other Encounters  Medication Dose Route Frequency Provider Last Rate Last Dose  . oxyCODONE (Oxy IR/ROXICODONE) immediate release tablet 5 mg  5 mg Oral Once Brunetta Genera, MD         REVIEW OF SYSTEMS: On review of systems, the patient reports that he is doing well overall. He denies any chest pain, shortness of breath, cough, fevers, chills, night sweats, unintended weight changes. He denies any bowel or bladder disturbances, and denies abdominal pain, nausea or vomiting. He denies any headaches, visual or auditory changes, and denies any  new musculoskeletal or joint aches or pains. A complete review of systems is obtained and is otherwise negative.     PHYSICAL EXAM:  Wt Readings from Last 3 Encounters:  04/02/19 185 lb (83.9 kg)  03/30/19 185 lb 1.6 oz (84 kg)  03/09/19 181 lb 6.4 oz (82.3 kg)   Temp Readings from Last 3 Encounters:  03/30/19 98.9 F (37.2 C) (Oral)  03/09/19 97.7 F (36.5 C) (Oral)  02/19/19 98.8 F (37.1 C) (Oral)   BP Readings from Last 3 Encounters:  03/30/19 (!) 145/86  03/09/19 (!) 141/81  02/19/19 (!) 154/97   Pulse Readings from Last 3 Encounters:  03/30/19 64  03/09/19 70  02/19/19 (!) 58   Pain Assessment Pain Score: 3  Pain Loc: Back/10  In general this is a well appearing caucasian male in no acute distress. He's alert and oriented x4 and appropriate throughout the examination. Cardiopulmonary assessment is negative for acute distress and he exhibits normal effort.     ECOG = 1  0 - Asymptomatic (Fully active, able to carry on all predisease activities without restriction)  1 - Symptomatic but completely ambulatory (Restricted in physically strenuous activity but ambulatory and able to carry out work of a light or sedentary nature. For example, light housework, office work)  2 - Symptomatic, <50% in bed during the day (Ambulatory and capable of all self care but unable to carry out any work activities. Up and about more than 50% of waking hours)  3 - Symptomatic, >50% in bed, but not bedbound (Capable of only limited self-care, confined to bed or chair 50% or more of waking hours)  4 - Bedbound (Completely disabled. Cannot carry on any self-care. Totally confined to bed or chair)  5 - Death   Eustace Pen MM, Creech RH, Tormey DC, et al. [phone]). "Toxicity and response criteria of the [facility] Group". [place] Oncol. 5 (6): 649-55    LABORATORY DATA:  Lab Results  Component Value Date   WBC 5.8 03/30/2019   HGB 14.0 03/30/2019   HCT 41.0  03/30/2019   MCV 97.4 03/30/2019   PLT 218 03/30/2019   Lab Results  Component Value Date   NA 138 03/30/2019   K 4.0 03/30/2019   CL 107 03/30/2019   CO2 23 03/30/2019   Lab Results  Component Value Date   ALT 17 03/30/2019   AST 19 03/30/2019   ALKPHOS 78 03/30/2019   BILITOT 0.4 03/30/2019      RADIOGRAPHY: No results found.     IMPRESSION/PLAN: 1. Extensive Stage Small Cell Carcinoma of the LUL. Dr. Lisbeth Renshaw discusses the findings and workup thus far. He discusses the option to consider PCI as he has had a good response to treatment and active disease appears under control with immunotherapy. . We discussed the risks, benefits, short, and long term effects of radiotherapy, and the patient is interested in  proceeding. Dr. Lisbeth Renshaw discusses the delivery and logistics of radiotherapy and anticipates a course of 2 weeks of radiotherapy. Dr. Lisbeth Renshaw discusses the dose fo radiotherapy would differ if he had disease at present and we will proceed with an MRI to rule this out. He will proceed with MRI in the next week or two and simulate on 04/20/2019. At that time he will sign written consent to proceed.  2. Concern for cognitive compromise from radiotherapy. Dr. Lisbeth Renshaw discusses the option of using Namenda to consider blunting the risks of cognitive compromise from radiotherapy. He reviews the risks and benefits and would like to start this along with treatment.  3. Claustrophobia. The patient would be interested in medication to proceed with tolerating MRI, simulation and radiotherapy treatments. A new Rx was sent in for Ativan to use prior to MRIs and treatment.   This encounter was provided by telemedicine platform Mychart.  The patient has given verbal consent for this type of encounter and has been advised to only accept a meeting of this type in a secure network environment. The time spent during this encounter was 45 minutes. The attendants for this meeting include Blenda Nicely, RN, Dr.  Lisbeth Renshaw, Hayden Pedro  and Jannifer Rodney Durango.  During the encounter,  Blenda Nicely, RN, Dr. Lisbeth Renshaw, and Hayden Pedro were located at [facility] Radiation Oncology Department.  Toddrick Pilar Plate Sorber was located at home.    The above documentation reflects my direct findings during this shared patient visit. Please see the separate note by Dr. Lisbeth Renshaw on this date for the remainder of the patient's plan of care.     Carola Rhine, PAC

## 2019-04-02 NOTE — Addendum Note (Signed)
Encounter addended by: Hayden Pedro, PA-C on: 04/02/2019 3:53 PM  Actions taken: Clinical Note Signed

## 2019-04-13 ENCOUNTER — Telehealth: Payer: Self-pay | Admitting: Radiation Oncology

## 2019-04-13 ENCOUNTER — Telehealth: Payer: Self-pay | Admitting: *Deleted

## 2019-04-13 NOTE — Telephone Encounter (Signed)
Called patient to inform of MRI for 04-16-19 - arrival time - 11:30 am @ [facility], no restrictions to test, spoke with Beather Arbour, and she verified understanding this test.

## 2019-04-13 NOTE — Telephone Encounter (Signed)
I received a call from the patient. He is wanting to forgo any radiation to the brain as a preventative course, but is interested in still having his MRI. He reports he would consider radiation if there was a lesion in the brain, but does not wish to pursue prophylactic treatment. We will cancel his simulation but follow up with his MRI when scheduled and completed.

## 2019-04-14 ENCOUNTER — Telehealth: Payer: Self-pay | Admitting: *Deleted

## 2019-04-14 NOTE — Telephone Encounter (Signed)
Patient called - stated he had a CT SIM appt for next week w/ Dr. Lisbeth Renshaw that he had just cancelled. He's not sure he wants to do radiation at this time and wanted Dr. Irene Limbo to know. He states he is having some increased pain in his back and will discuss that with Dr. Irene Limbo when he's here Monday 7/27.

## 2019-04-16 ENCOUNTER — Ambulatory Visit (HOSPITAL_COMMUNITY)
Admission: RE | Admit: 2019-04-16 | Discharge: 2019-04-16 | Disposition: A | Discharge status: Home / Self Care | Payer: Self-pay | Source: Ambulatory Visit | Attending: Radiation Oncology | Admitting: Radiation Oncology

## 2019-04-16 ENCOUNTER — Other Ambulatory Visit: Payer: Self-pay

## 2019-04-16 DIAGNOSIS — C3490 Malignant neoplasm of unspecified part of unspecified bronchus or lung: Secondary | ICD-10-CM | POA: Insufficient documentation

## 2019-04-16 MED ORDER — GADOBUTROL 1 MMOL/ML IV SOLN
8.0000 mL | Freq: Once | INTRAVENOUS | Status: AC | PRN
  Administered 2019-04-16: 8 mL via INTRAVENOUS

## 2019-04-17 NOTE — Progress Notes (Signed)
HEMATOLOGY/ONCOLOGY CLINIC NOTE  Date of Service: 04/20/2019  Patient Care Team: Default, Provider, MD as PCP - General  CHIEF COMPLAINTS/PURPOSE OF CONSULTATION:  F/u for Small Cell Lung Cancer  HISTORY OF PRESENTING ILLNESS:  Cody Montoya a 63 y.o.malewith medical history significant ofalcohol abuse.He does not have a primary care provider and does not seek routine medical care. Around 3 weeks ago, the patient started having epigastric abdominal pain. It would wax and wane in severity but progressively worsenedsince onset. He has had associated nausea and vomiting. His appetite has decreased and he is lost around 10 pounds. He stopped drinking when this happened. He would drink around 7-8 shots of vodka daily in addition to the 2-3 beers. He has done this for the past 4-5 years. He does not have any known history of hepatitis B or C and denies taking any medication/supplements routinely. He denies any prominent vessels, yellowing of the skin/eyes, fevers, shortness of breath, or bowel changes. The patient has been using ibuprofen for pain with little relief.  In the emergency room, a CT scan showsmultifocal hepatocellular carcinoma that is not appear to be metastatic to the liver. There is also thoracic adenopathy,omental/peritoneal involvement and left pleural effusion suggestive of metastatic disease. The oncology team was called and recommended admission for work-up.   When seen today, the patient reports that he continues to have abdominal discomfort in his upper abdomen.  Along with the discomfort in his abdomen, he also reports nausea and vomiting and increased constipation.  This is been present for about 2 to 3 weeks.  He states his appetite has been decreased and he has lost weight.  He estimates that he has lost about 10 pounds over the past few months.  Oncology was asked to see the patient to make recommendations regarding his liver  masses.  Interval History:   Cody Montoya is here for follow-up of his extensive stage small cell lung cancer. The patient's last visit with [place] was on 03/30/2019. The pt reports that he is doing well overall.  The pt reports some back pain in a new spot that feels like a bruised rib. He notes falling on a boat recently, but is unsure if this is the cause of his pain. He also has pain in his shoulders that radiates to his upper chest, but this is not new. Denies any new headaches. He has been taking Ibuprofen in addition to his current pain medication, which manages his back pain relatively well.  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 No significant edema or mass effect."  Lab results today (04/20/2019) of CBC w/diff and CMP is as follows: all values are WNL except for PLT at 0.3 04/20/2019 Magnesium at 1.7 04/20/2019 T4 is pending 04/20/2019 TSH at 2.059 04/20/2019 LDH at 159  On review of systems, pt reports some leg swelling, moving bowels well, new back pain, upper shoulder pain, and denies belly pain, new headaches and any other symptoms.    MEDICAL HISTORY:  Past Medical History:  Diagnosis Date   Cancer [facility])     SURGICAL HISTORY: Past Surgical History:  Procedure Laterality Date   NO PAST SURGERIES      SOCIAL HISTORY: Social History   Socioeconomic History   Marital status: Single    Spouse name: Not on file   Number of children: Not on file   Years  of education: Not on file   Highest education level: Not on file  Occupational History   Occupation: Surveyor, quantity  Social Needs   Financial resource strain: Not very hard   Food insecurity    Worry: Never true    Inability: Never true   Transportation needs    Medical: No    Non-medical: No  Tobacco Use   Smoking status: Current Every  Day Smoker    Packs/day: 1.00    Years: 45.00    Pack years: 45.00    Types: Cigarettes   Smokeless tobacco: Never Used  Substance and Sexual Activity   Alcohol use: Not Currently    Alcohol/week: 8.0 standard drinks    Types: 3 Cans of beer, 5 Shots of liquor per week    Comment: no alcohol in past month due to symptoms   Drug use: Not Currently    Frequency: 7.0 times per week    Types: Marijuana   Sexual activity: Not Currently  Lifestyle   Physical activity    Days per week: 5 days    Minutes per session: 150+ min   Stress: To some extent  Relationships   Social connections    Talks on phone: More than three times a week    Gets together: More than three times a week    Attends religious service: Never    Active member of club or organization: No    Attends meetings of clubs or organizations: Never    Relationship status: Living with partner   Intimate partner violence    Fear of current or ex partner: No    Emotionally abused: No    Physically abused: No    Forced sexual activity: No  Other Topics Concern   Not on file  Social History Narrative   Not on file    FAMILY HISTORY: Family History  Problem Relation Age of Onset   Breast cancer Sister    Breast cancer Sister     ALLERGIES:  has No Known Allergies.  MEDICATIONS:  Current Outpatient Medications  Medication Sig Dispense Refill   docusate sodium (COLACE) 100 MG capsule Take 1 capsule (100 mg total) by mouth 2 (two) times daily. 10 capsule 0   ibuprofen (ADVIL,MOTRIN) 200 MG tablet Take 800 mg by mouth every 6 (six) hours as needed for fever or moderate pain.     LORazepam (ATIVAN) 0.5 MG tablet 1 tab po q 4-6 hours prn or 1 tab po 30 minutes prior to MRI or radiation 30 tablet 0   memantine (NAMENDA) 5 MG tablet Start on first day of XRT: Week 1: take 1 tablet po q am Week 2: take 1 tablet po q am and 1 tablet po q pm Week 3: take 2 tablets po q am, and 1 tablets po q pm Week 4-24: take  2 tablets po q am, and 2 tablets po q pm.  Stop after 24 weeks 10/09/2019 should be last day 120 tablet 3   ondansetron (ZOFRAN) 8 MG tablet Take 1 tablet (8 mg total) by mouth 2 (two) times daily as needed for refractory nausea / vomiting. Start on day 3 after carboplatin chemo. 30 tablet 1   oxyCODONE (OXY IR/ROXICODONE) 5 MG immediate release tablet Take 1-2 tablets (5-10 mg total) by mouth every 6 (six) hours as needed for moderate pain. 120 tablet 0   oxyCODONE (OXYCONTIN) 10 mg 12 hr tablet Take 1 tablet (10 mg total) by mouth every 12 (twelve) hours. [place]  tablet 0   polyethylene glycol (MIRALAX / GLYCOLAX) packet Take 17 g by mouth daily as needed for mild constipation. 14 each 0   No current facility-administered medications for this visit.    Facility-Administered Medications Ordered in Other Visits  Medication Dose Route Frequency Provider Last Rate Last Dose   oxyCODONE (Oxy IR/ROXICODONE) immediate release tablet 5 mg  5 mg Oral Once Brunetta Genera, MD        REVIEW OF SYSTEMS:    A 10+ POINT REVIEW OF SYSTEMS WAS OBTAINED including neurology, dermatology, psychiatry, cardiac, respiratory, lymph, extremities, GI, GU, Musculoskeletal, constitutional, breasts, reproductive, HEENT.  All pertinent positives are noted in the HPI.  All others are negative.   PHYSICAL EXAMINATION: ECOG PERFORMANCE STATUS: 1-2 Vital signs reviewed in epic  GENERAL:alert, in no acute distress and comfortable SKIN: no acute rashes, right posterior rib pain with palpation EYES: conjunctiva are pink and non-injected, sclera anicteric OROPHARYNX: MMM, no exudates, no oropharyngeal erythema or ulceration NECK: supple, no JVD LYMPH:  no palpable lymphadenopathy in the cervical, axillary or inguinal regions LUNGS: clear to auscultation b/l with normal respiratory effort HEART: regular rate & rhythm ABDOMEN: improved hepatomegaly about 1-2 fingerbreadths below costal line, no TTP, negative Murphy's  sign Extremity: no pedal edema PSYCH: alert & oriented x 3 with fluent speech NEURO: no focal motor/sensory deficits    LABORATORY DATA:  I have reviewed the data as listed  . CBC Latest Ref Rng & Units 04/20/2019 03/30/2019 03/09/2019  WBC 4.0 - 10.5 K/uL 7.5 5.8 8.0  Hemoglobin 13.0 - 17.0 g/dL 14.2 14.0 13.6  Hematocrit 39.0 - 52.0 % 42.2 41.0 41.7  Platelets 150 - 400 K/uL 186 218 212    . CMP Latest Ref Rng & Units 04/20/2019 03/30/2019 03/09/2019  Glucose 70 - 99 mg/dL 99 96 88  BUN 8 - 23 mg/dL 14 8 10   Creatinine 0.61 - 1.24 mg/dL 0.74 0.66 0.65  Sodium 135 - 145 mmol/L 137 138 138  Potassium 3.5 - 5.1 mmol/L 4.5 4.0 4.0  Chloride 98 - 111 mmol/L 105 107 108  CO2 22 - 32 mmol/L 23 23 21(L)  Calcium 8.9 - 10.3 mg/dL 9.8 9.0 9.1  Total Protein 6.5 - 8.1 g/dL 7.7 7.4 7.3  Total Bilirubin 0.3 - 1.2 mg/dL 0.5 0.4 0.4  Alkaline Phos 38 - 126 U/L 61 78 96  AST 15 - 41 U/L 17 19 14(L)  ALT 0 - 44 U/L 18 17 15        RADIOGRAPHIC STUDIES: I have personally reviewed the radiological images as listed and agreed with the findings in the report. Mr Jeri Cos Wo Contrast  Result Date: 04/16/2019 CLINICAL DATA:  Small cell lung cancer staging EXAM: MRI HEAD WITHOUT AND WITH CONTRAST TECHNIQUE: Multiplanar, multiecho pulse sequences of the brain and surrounding structures were obtained without and with intravenous contrast. CONTRAST:  8 mL Gadavist COMPARISON:  None. FINDINGS: BRAIN: There is no acute infarct, acute hemorrhage or extra-axial collection. The white matter signal is normal for the patient's age. The cerebral and cerebellar volume are age-appropriate. There is no hydrocephalus. The midline structures are normal. There is no midline shift or mass effect. Susceptibility-sensitive sequences show no chronic microhemorrhage or superficial siderosis. There are multiple contrast-enhancing lesions: 1. Right cerebellum 7 x 7 mm, series 12, image 12 2. Left cerebellum 3 mm, image 13 3.  Midline pons, 2 mm, image 20 4. Ventral right pons 5 x 4 mm, image 23 5. Left ventral pons  3 mm, image 25 6. Right occipital lobe 3 mm, image 29 7. Left thalamus, 4 mm, image 44 left occipital lobe 4.7 mm, image 44 8. Right frontal lobe, 2 mm, image 66 VASCULAR: The major intracranial arterial and venous sinus flow voids are normal. SKULL AND UPPER CERVICAL SPINE: Calvarial bone marrow signal is normal. There is no skull base mass. The visualized upper cervical spine and soft tissues are normal. SINUSES/ORBITS: Moderate bilateral maxillary sinus mucosal thickening. The orbits are normal. 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 No significant edema or mass effect. Electronically Signed   By: Ulyses Jarred M.D.   On: 04/16/2019 22:37    ASSESSMENT & PLAN:   63 y.o. male with  1. 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 2. Mild chronic small vessel ischemic disease."   2.  Abnormal liver function tests.  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  Bump in bilirubin could be from progression of his liver mets.   12/18/18 US Abdomen revealed Widespread metastatic disease throughout the liver. The liver has a somewhat nodular contour suggesting underlying cirrhosis. 2. No gallstones seen. Gallbladder wall appears mildly thickened and edematous. Sludge is noted in the gallbladder. There is also prominence of the common bile duct without mass or calculus evident. These findings raise concern for potential degree of acalculus cholecystitis. In this regard, it may be prudent to consider nuclear medicine hepatobiliary imaging study to assess for cystic duct patency.  01/02/19 NM Hepatobiliary including GB revealed No biliary obstruction. 2. Normal hepatobiliary scan.  S/p 4 cycles of Carboplatin AUC of 5, 50mg /m2  Etoposide, and Atezolizumab at this time.  03/03/19 CT C/A/P revealed "Substantial partial treatment response as detailed below. No new or progressive metastatic disease. 2. Central left upper lobe lung mass is substantially decreased in size. 3. Mediastinal adenopathy is substantially decreased, with residual mild AP window adenopathy. Retroperitoneal adenopathy has resolved. 4. Liver metastases are decreased. 5. Stable faintly sclerotic osseous lesions in the spine and left iliac wing. 6. Stable irregular thickening of the adrenal glands without discrete adrenal nodules. 7. Aortic Atherosclerosis."  Previously discussed the NCCN guidelines of 4-6 cycles of treatment, followed by maintenance immunotherapy. Pt's disease has responded well, and discussed risks vs benefits of completing 1-2 additional cycles (pt has completed 4 cycles thus far), in setting of patient's liver cirrhosis and palliative treatment. Discussed patient's goals of care. Patient's goals are to stop chemotherapy treatment after C4, and will focus on spending quality time with friends and family.  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 No significant edema or mass effect." -patient will need to f/u with radiation oncology to consider WBXRT. -The pt has no prohibitive toxicities from continuing maintenance Atezolizumab every 3 weeks, at this time. -Will continue Atezolizumab every 3 weeks until progression or intolerance -Continue Xgeva every 4 weeks for bone strengthening, denies dental pains, discussed risk of non-healing ulcers -Continue immunotherapy -F/U with rad onc -Continue Oxycontin 10 mg every 12 hrs and Oxycodone IR 5-10 mg every 6  hrs -Refill oxycodone and oxycontin  -Repeat CT in 1 month -Schedule CXR and XRay T spine x ray for today-- reviewed non displaced rib fractures noted -Will see the  pt back in 3 weeks   -F/u for next dose of maintenance Atezolizumab in 3 weeks as scheduled on 8/17 -CXR and XRay T spine today after treatment   All of the patients questions were answered with apparent satisfaction. The patient knows to call the clinic with any problems, questions or concerns.  The total time spent in the appt was 25 minutes and more than 50% was on counseling and direct patient cares.  Sullivan Lone MD [place] AAHIVMS [facility] Hematology/Oncology Physician [facility]  (Office):       [phone] (Work cell):  [phone] (Fax):           [phone]  I, De Burrs, am acting as a scribe for Dr. Irene Limbo  .I have reviewed the above documentation for accuracy and completeness, and I agree with the above. Brunetta Genera MD

## 2019-04-20 ENCOUNTER — Other Ambulatory Visit: Payer: Self-pay

## 2019-04-20 ENCOUNTER — Ambulatory Visit: Payer: Self-pay | Admitting: Radiation Oncology

## 2019-04-20 ENCOUNTER — Inpatient Hospital Stay: Payer: Self-pay

## 2019-04-20 ENCOUNTER — Inpatient Hospital Stay (HOSPITAL_BASED_OUTPATIENT_CLINIC_OR_DEPARTMENT_OTHER): Payer: Self-pay | Admitting: Hematology

## 2019-04-20 ENCOUNTER — Telehealth: Payer: Self-pay | Admitting: Radiation Oncology

## 2019-04-20 ENCOUNTER — Telehealth: Payer: Self-pay | Admitting: Hematology

## 2019-04-20 ENCOUNTER — Ambulatory Visit (HOSPITAL_COMMUNITY)
Admission: RE | Admit: 2019-04-20 | Discharge: 2019-04-20 | Disposition: A | Discharge status: Home / Self Care | Payer: Self-pay | Source: Ambulatory Visit | Attending: Hematology | Admitting: Hematology

## 2019-04-20 VITALS — BP 135/76 | HR 56 | Temp 98.3°F | Resp 18 | Wt 185.3 lb

## 2019-04-20 DIAGNOSIS — C787 Secondary malignant neoplasm of liver and intrahepatic bile duct: Secondary | ICD-10-CM

## 2019-04-20 DIAGNOSIS — Z7189 Other specified counseling: Secondary | ICD-10-CM

## 2019-04-20 DIAGNOSIS — C7951 Secondary malignant neoplasm of bone: Secondary | ICD-10-CM | POA: Insufficient documentation

## 2019-04-20 DIAGNOSIS — C3490 Malignant neoplasm of unspecified part of unspecified bronchus or lung: Secondary | ICD-10-CM

## 2019-04-20 DIAGNOSIS — Z720 Tobacco use: Secondary | ICD-10-CM

## 2019-04-20 DIAGNOSIS — C3412 Malignant neoplasm of upper lobe, left bronchus or lung: Secondary | ICD-10-CM

## 2019-04-20 DIAGNOSIS — M899 Disorder of bone, unspecified: Secondary | ICD-10-CM

## 2019-04-20 DIAGNOSIS — M25519 Pain in unspecified shoulder: Secondary | ICD-10-CM

## 2019-04-20 DIAGNOSIS — M545 Low back pain: Secondary | ICD-10-CM

## 2019-04-20 DIAGNOSIS — C7931 Secondary malignant neoplasm of brain: Secondary | ICD-10-CM

## 2019-04-20 DIAGNOSIS — R0781 Pleurodynia: Secondary | ICD-10-CM

## 2019-04-20 DIAGNOSIS — R0789 Other chest pain: Secondary | ICD-10-CM

## 2019-04-20 LAB — MAGNESIUM: Magnesium: 1.7 mg/dL (ref 1.7–2.4)

## 2019-04-20 LAB — CBC WITH DIFFERENTIAL/PLATELET
Abs Immature Granulocytes: 0.02 10*3/uL (ref 0.00–0.07)
Basophils Absolute: 0 10*3/uL (ref 0.0–0.1)
Basophils Relative: 0 %
Eosinophils Absolute: 0.1 10*3/uL (ref 0.0–0.5)
Eosinophils Relative: 2 %
HCT: 42.2 % (ref 39.0–52.0)
Hemoglobin: 14.2 g/dL (ref 13.0–17.0)
Immature Granulocytes: 0 %
Lymphocytes Relative: 26 %
Lymphs Abs: 2 10*3/uL (ref 0.7–4.0)
MCH: 32.9 pg (ref 26.0–34.0)
MCHC: 33.6 g/dL (ref 30.0–36.0)
MCV: 97.7 fL (ref 80.0–100.0)
Monocytes Absolute: 0.6 10*3/uL (ref 0.1–1.0)
Monocytes Relative: 8 %
Neutro Abs: 4.8 10*3/uL (ref 1.7–7.7)
Neutrophils Relative %: 64 %
Platelets: 186 10*3/uL (ref 150–400)
RBC: 4.32 MIL/uL (ref 4.22–5.81)
RDW: 12.6 % (ref 11.5–15.5)
WBC: 7.5 10*3/uL (ref 4.0–10.5)
nRBC: 0.3 % — ABNORMAL HIGH (ref 0.0–0.2)

## 2019-04-20 LAB — CMP (CANCER CENTER ONLY)
ALT: 18 U/L (ref 0–44)
AST: 17 U/L (ref 15–41)
Albumin: 4.1 g/dL (ref 3.5–5.0)
Alkaline Phosphatase: 61 U/L (ref 38–126)
Anion gap: 9 (ref 5–15)
BUN: 14 mg/dL (ref 8–23)
CO2: 23 mmol/L (ref 22–32)
Calcium: 9.8 mg/dL (ref 8.9–10.3)
Chloride: 105 mmol/L (ref 98–111)
Creatinine: 0.74 mg/dL (ref 0.61–1.24)
GFR, Est AFR Am: >60 mL/min (ref >60)
GFR, Estimated: >60 mL/min (ref >60)
Glucose, Bld: 99 mg/dL (ref 70–99)
Potassium: 4.5 mmol/L (ref 3.5–5.1)
Sodium: 137 mmol/L (ref 135–145)
Total Bilirubin: 0.5 mg/dL (ref 0.3–1.2)
Total Protein: 7.7 g/dL (ref 6.5–8.1)

## 2019-04-20 LAB — LACTATE DEHYDROGENASE: LDH: 159 U/L (ref 98–192)

## 2019-04-20 LAB — TSH: TSH: 2.059 u[IU]/mL (ref 0.320–4.118)

## 2019-04-20 MED ORDER — ACETAMINOPHEN 325 MG PO TABS
ORAL_TABLET | ORAL | Status: AC
  Filled 2019-04-20: qty 2

## 2019-04-20 MED ORDER — FAMOTIDINE 20 MG PO TABS
20.0000 mg | ORAL_TABLET | Freq: Once | ORAL | Status: AC
  Administered 2019-04-20: 10:00:00 20 mg via ORAL

## 2019-04-20 MED ORDER — DIPHENHYDRAMINE HCL 25 MG PO TABS
25.0000 mg | ORAL_TABLET | Freq: Once | ORAL | Status: AC
  Administered 2019-04-20: 25 mg via ORAL
  Filled 2019-04-20: qty 1

## 2019-04-20 MED ORDER — ACETAMINOPHEN 325 MG PO TABS
650.0000 mg | ORAL_TABLET | Freq: Once | ORAL | Status: AC
  Administered 2019-04-20: 650 mg via ORAL

## 2019-04-20 MED ORDER — SODIUM CHLORIDE 0.9 % IV SOLN
Freq: Once | INTRAVENOUS | Status: AC
  Administered 2019-04-20: 10:00:00 via INTRAVENOUS
  Filled 2019-04-20: qty 250

## 2019-04-20 MED ORDER — OXYCODONE HCL 5 MG PO TABS: 5.0000 mg | ORAL_TABLET | Freq: Four times a day (QID) | ORAL | 0 refills | Status: DC | PRN

## 2019-04-20 MED ORDER — DIPHENHYDRAMINE HCL 25 MG PO CAPS
ORAL_CAPSULE | ORAL | Status: AC
  Filled 2019-04-20: qty 1

## 2019-04-20 MED ORDER — OXYCODONE HCL ER 10 MG PO T12A: 10.0000 mg | EXTENDED_RELEASE_TABLET | Freq: Two times a day (BID) | ORAL | 0 refills | Status: DC

## 2019-04-20 MED ORDER — SODIUM CHLORIDE 0.9 % IV SOLN
1200.0000 mg | Freq: Once | INTRAVENOUS | Status: AC
  Administered 2019-04-20: 1200 mg via INTRAVENOUS
  Filled 2019-04-20: qty 20

## 2019-04-20 MED ORDER — FAMOTIDINE 20 MG PO TABS
ORAL_TABLET | ORAL | Status: AC
  Filled 2019-04-20: qty 1

## 2019-04-20 NOTE — Telephone Encounter (Signed)
LM for pt to call me back about MRI results

## 2019-04-20 NOTE — Telephone Encounter (Signed)
Per 7/27 los, appts already scheduled.

## 2019-04-20 NOTE — Telephone Encounter (Signed)
The patient called back and we discussed his MRI results and need to treat. He is interested in coming in for simulation on Wednesday at 11 am.

## 2019-04-20 NOTE — Patient Instructions (Signed)
Atezolizumab injection What is this medicine? ATEZOLIZUMAB (a te zoe LIZ ue mab) is a monoclonal antibody. It is used to treat bladder cancer (urothelial cancer), non-small cell lung cancer, small cell lung cancer, and breast cancer. This medicine may be used for other purposes; ask your health care provider or pharmacist if you have questions. COMMON BRAND NAME(S): Tecentriq What should I tell my health care provider before I take this medicine? They need to know if you have any of these conditions:  diabetes  immune system problems  infection  inflammatory bowel disease  liver disease  lung or breathing disease  lupus  nervous system problems like myasthenia gravis or Guillain-Barre syndrome  organ transplant  an unusual or allergic reaction to atezolizumab, other medicines, foods, dyes, or preservatives  pregnant or trying to get pregnant  breast-feeding How should I use this medicine? This medicine is for infusion into a vein. It is given by a health care professional in a hospital or clinic setting. A special MedGuide will be given to you before each treatment. Be sure to read this information carefully each time. Talk to your pediatrician regarding the use of this medicine in children. Special care may be needed. Overdosage: If you think you have taken too much of this medicine contact a poison control center or emergency room at once. NOTE: This medicine is only for you. Do not share this medicine with others. What if I miss a dose? It is important not to miss your dose. Call your doctor or health care professional if you are unable to keep an appointment. What may interact with this medicine? Interactions have not been studied. This list may not describe all possible interactions. Give your health care provider a list of all the medicines, herbs, non-prescription drugs, or dietary supplements you use. Also tell them if you smoke, drink alcohol, or use illegal drugs.  Some items may interact with your medicine. What should I watch for while using this medicine? Your condition will be monitored carefully while you are receiving this medicine. You may need blood work done while you are taking this medicine. Do not become pregnant while taking this medicine or for at least 5 months after stopping it. Women should inform their doctor if they wish to become pregnant or think they might be pregnant. There is a potential for serious side effects to an unborn child. Talk to your health care professional or pharmacist for more information. Do not breast-feed an infant while taking this medicine or for at least 5 months after the last dose. What side effects may I notice from receiving this medicine? Side effects that you should report to your doctor or health care professional as soon as possible:  allergic reactions like skin rash, itching or hives, swelling of the face, lips, or tongue  black, tarry stools  bloody or watery diarrhea  breathing problems  changes in vision  chest pain or chest tightness  chills  facial flushing  fever  headache  signs and symptoms of high blood sugar such as dizziness; dry mouth; dry skin; fruity breath; nausea; stomach pain; increased hunger or thirst; increased urination  signs and symptoms of liver injury like dark yellow or brown urine; general ill feeling or flu-like symptoms; light-colored stools; loss of appetite; nausea; right upper belly pain; unusually weak or tired; yellowing of the eyes or skin  stomach pain  trouble passing urine or change in the amount of urine Side effects that usually do not require medical   attention (report to your doctor or health care professional if they continue or are bothersome):  cough  diarrhea  joint pain  muscle pain  muscle weakness  tiredness  weight loss This list may not describe all possible side effects. Call your doctor for medical advice about side  effects. You may report side effects to FDA at 1-800-FDA-1088. Where should I keep my medicine? This drug is given in a hospital or clinic and will not be stored at home. NOTE: This sheet is a summary. It may not cover all possible information. If you have questions about this medicine, talk to your doctor, pharmacist, or health care provider.  2020 Elsevier/Gold Standard (2017-12-13 09:33:38)  

## 2019-04-21 LAB — T4: T4, Total: 7.5 ug/dL (ref 4.5–12.0)

## 2019-04-22 ENCOUNTER — Other Ambulatory Visit: Payer: Self-pay

## 2019-04-22 ENCOUNTER — Ambulatory Visit
Admission: RE | Admit: 2019-04-22 | Discharge: 2019-04-22 | Disposition: A | Discharge status: Home / Self Care | Payer: Self-pay | Source: Ambulatory Visit | Attending: Radiation Oncology | Admitting: Radiation Oncology

## 2019-04-22 DIAGNOSIS — Z299 Encounter for prophylactic measures, unspecified: Secondary | ICD-10-CM | POA: Insufficient documentation

## 2019-04-22 DIAGNOSIS — C3412 Malignant neoplasm of upper lobe, left bronchus or lung: Secondary | ICD-10-CM | POA: Insufficient documentation

## 2019-04-22 DIAGNOSIS — C7931 Secondary malignant neoplasm of brain: Secondary | ICD-10-CM

## 2019-04-22 DIAGNOSIS — Z510 Encounter for antineoplastic radiation therapy: Secondary | ICD-10-CM | POA: Insufficient documentation

## 2019-04-22 DIAGNOSIS — F17210 Nicotine dependence, cigarettes, uncomplicated: Secondary | ICD-10-CM | POA: Insufficient documentation

## 2019-04-29 ENCOUNTER — Other Ambulatory Visit: Payer: Self-pay

## 2019-04-29 ENCOUNTER — Ambulatory Visit
Admission: RE | Admit: 2019-04-29 | Discharge: 2019-04-29 | Disposition: A | Discharge status: Home / Self Care | Payer: Medicaid Other | Source: Ambulatory Visit | Attending: Radiation Oncology | Admitting: Radiation Oncology

## 2019-04-29 DIAGNOSIS — C3412 Malignant neoplasm of upper lobe, left bronchus or lung: Secondary | ICD-10-CM | POA: Diagnosis not present

## 2019-04-29 DIAGNOSIS — Z510 Encounter for antineoplastic radiation therapy: Secondary | ICD-10-CM | POA: Diagnosis present

## 2019-04-29 DIAGNOSIS — Z299 Encounter for prophylactic measures, unspecified: Secondary | ICD-10-CM | POA: Diagnosis not present

## 2019-04-29 DIAGNOSIS — F17210 Nicotine dependence, cigarettes, uncomplicated: Secondary | ICD-10-CM | POA: Diagnosis not present

## 2019-04-30 ENCOUNTER — Telehealth: Payer: Self-pay | Admitting: Hematology

## 2019-04-30 ENCOUNTER — Telehealth: Payer: Self-pay | Admitting: *Deleted

## 2019-04-30 ENCOUNTER — Other Ambulatory Visit: Payer: Self-pay

## 2019-04-30 ENCOUNTER — Ambulatory Visit
Admission: RE | Admit: 2019-04-30 | Discharge: 2019-04-30 | Disposition: A | Discharge status: Home / Self Care | Payer: Medicaid Other | Source: Ambulatory Visit | Attending: Radiation Oncology | Admitting: Radiation Oncology

## 2019-04-30 DIAGNOSIS — Z510 Encounter for antineoplastic radiation therapy: Secondary | ICD-10-CM | POA: Diagnosis not present

## 2019-04-30 NOTE — Telephone Encounter (Signed)
Patient called - experiencing numbness on left side, "from armpit down the side". He wanted MD to know. Also asked about a prescription for Ibuprofen 800 mg. Says he takes ibuprofen with his pain medicine to make it work better. And he said he's here Friday in radiation (9:15 appt) if MD wants to see him. His next appt w/Dr. Irene Limbo is 8/17. Dr. Irene Limbo given message

## 2019-05-01 ENCOUNTER — Other Ambulatory Visit: Payer: Self-pay

## 2019-05-01 ENCOUNTER — Ambulatory Visit
Admission: RE | Admit: 2019-05-01 | Discharge: 2019-05-01 | Disposition: A | Discharge status: Home / Self Care | Payer: Medicaid Other | Source: Ambulatory Visit | Attending: Radiation Oncology | Admitting: Radiation Oncology

## 2019-05-01 DIAGNOSIS — Z510 Encounter for antineoplastic radiation therapy: Secondary | ICD-10-CM | POA: Diagnosis not present

## 2019-05-04 ENCOUNTER — Ambulatory Visit
Admission: RE | Admit: 2019-05-04 | Discharge: 2019-05-04 | Disposition: A | Discharge status: Home / Self Care | Payer: Medicaid Other | Source: Ambulatory Visit | Attending: Radiation Oncology | Admitting: Radiation Oncology

## 2019-05-04 ENCOUNTER — Other Ambulatory Visit: Payer: Self-pay

## 2019-05-04 DIAGNOSIS — Z510 Encounter for antineoplastic radiation therapy: Secondary | ICD-10-CM | POA: Diagnosis not present

## 2019-05-05 ENCOUNTER — Telehealth: Payer: Self-pay | Admitting: Internal Medicine

## 2019-05-05 ENCOUNTER — Ambulatory Visit
Admission: RE | Admit: 2019-05-05 | Discharge: 2019-05-05 | Disposition: A | Discharge status: Home / Self Care | Payer: Medicaid Other | Source: Ambulatory Visit | Attending: Radiation Oncology | Admitting: Radiation Oncology

## 2019-05-05 ENCOUNTER — Telehealth: Payer: Self-pay | Admitting: *Deleted

## 2019-05-05 ENCOUNTER — Other Ambulatory Visit: Payer: Self-pay

## 2019-05-05 ENCOUNTER — Other Ambulatory Visit: Payer: Self-pay | Admitting: Hematology

## 2019-05-05 DIAGNOSIS — R200 Anesthesia of skin: Secondary | ICD-10-CM

## 2019-05-05 DIAGNOSIS — Z510 Encounter for antineoplastic radiation therapy: Secondary | ICD-10-CM | POA: Diagnosis not present

## 2019-05-05 NOTE — Telephone Encounter (Signed)
Patient reports numbness to his left chest wall and down the left part of his left side of trunk for 1 week. He was recommended to go to ED/Urgent care for urgent evaluation. He might need rpt brain and possibly spine imaging.  Brunetta Genera

## 2019-05-05 NOTE — Telephone Encounter (Signed)
Patient called back today - LVM asking if Dr. Irene Limbo was going to order a scan, thought he was going to do it when they spoke last Friday.  Per Dr. Irene Limbo, he told patient he might need scan if symptoms worsened significantly and recommended Urgent Care/ED if that occurred. He is referring patient to Dr. Mickeal Skinner, neuro-oncologist at [facility] for evaluation of left sided numbness. Attempted to contact patient with this information. Per Ms. Reller, patient asleep. She took message of all information provided by Dr. Irene Limbo as noted above.  She reports patient has been very nauseated, not able to eat or drink much without throwing up. He is taking OTC Dramamine for N/V. When asked why pt was not taking Zofran, she explained cost of $10.00 a pill was too much.  Informed her that scheduling will contact w/appt for Dr. Mickeal Skinner and Dr. Irene Limbo would be updated. She verbalized understanding.

## 2019-05-05 NOTE — Telephone Encounter (Signed)
Received an urgent referral from Dr. Irene Limbo for left chest wall numbness, h/o brain mets and spinal lesions. I cld Cody Montoya and scheduled him to see Dr. Mickeal Skinner on 8/13 at 1030am. He's agreed to the appt date and time.

## 2019-05-06 ENCOUNTER — Other Ambulatory Visit: Payer: Self-pay

## 2019-05-06 ENCOUNTER — Ambulatory Visit
Admission: RE | Admit: 2019-05-06 | Discharge: 2019-05-06 | Disposition: A | Discharge status: Home / Self Care | Payer: Medicaid Other | Source: Ambulatory Visit | Attending: Radiation Oncology | Admitting: Radiation Oncology

## 2019-05-06 DIAGNOSIS — Z510 Encounter for antineoplastic radiation therapy: Secondary | ICD-10-CM | POA: Diagnosis not present

## 2019-05-07 ENCOUNTER — Inpatient Hospital Stay: Payer: Medicaid Other | Attending: Hematology | Admitting: Internal Medicine

## 2019-05-07 ENCOUNTER — Ambulatory Visit
Admission: RE | Admit: 2019-05-07 | Discharge: 2019-05-07 | Disposition: A | Discharge status: Home / Self Care | Payer: Medicaid Other | Source: Ambulatory Visit | Attending: Radiation Oncology | Admitting: Radiation Oncology

## 2019-05-07 ENCOUNTER — Other Ambulatory Visit: Payer: Self-pay

## 2019-05-07 VITALS — BP 145/80 | HR 60 | Temp 98.3°F | Resp 17 | Ht 70.0 in | Wt 182.2 lb

## 2019-05-07 DIAGNOSIS — M5414 Radiculopathy, thoracic region: Secondary | ICD-10-CM | POA: Insufficient documentation

## 2019-05-07 DIAGNOSIS — Z5112 Encounter for antineoplastic immunotherapy: Secondary | ICD-10-CM | POA: Insufficient documentation

## 2019-05-07 DIAGNOSIS — C3412 Malignant neoplasm of upper lobe, left bronchus or lung: Secondary | ICD-10-CM | POA: Insufficient documentation

## 2019-05-07 DIAGNOSIS — C7931 Secondary malignant neoplasm of brain: Secondary | ICD-10-CM | POA: Insufficient documentation

## 2019-05-07 DIAGNOSIS — Z510 Encounter for antineoplastic radiation therapy: Secondary | ICD-10-CM | POA: Diagnosis not present

## 2019-05-07 DIAGNOSIS — Z79899 Other long term (current) drug therapy: Secondary | ICD-10-CM | POA: Diagnosis not present

## 2019-05-08 ENCOUNTER — Telehealth: Payer: Self-pay | Admitting: Internal Medicine

## 2019-05-08 ENCOUNTER — Other Ambulatory Visit: Payer: Self-pay

## 2019-05-08 ENCOUNTER — Ambulatory Visit
Admission: RE | Admit: 2019-05-08 | Discharge: 2019-05-08 | Disposition: A | Discharge status: Home / Self Care | Payer: Medicaid Other | Source: Ambulatory Visit | Attending: Radiation Oncology | Admitting: Radiation Oncology

## 2019-05-08 DIAGNOSIS — Z510 Encounter for antineoplastic radiation therapy: Secondary | ICD-10-CM | POA: Diagnosis not present

## 2019-05-08 NOTE — Telephone Encounter (Signed)
No los per 8/13. °

## 2019-05-08 NOTE — Progress Notes (Signed)
[facility] [place], [place] [address] [phone]   New Patient Evaluation  Date of Service: 05/08/19 Patient Name: Nigil Braman [facility] Patient MRN: [id number] Patient DOB: 08-22-1956 Provider: Ventura Sellers, MD  Identifying Statement:  Reno Clasby is a 63 y.o. 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[place])   12/15/2018 -  Chemotherapy   The patient had palonosetron (ALOXI) injection 0.25 mg, 0.25 mg, Intravenous,  Once, 4 of 4 cycles Administration: 0.25 mg (12/15/2018), 0.25 mg (01/05/2019), 0.25 mg (01/26/2019), 0.25 mg (02/17/2019) pegfilgrastim (NEULASTA ONPRO KIT) injection 6 mg, 6 mg, Subcutaneous, Once, 3 of 3 cycles Administration: 6 mg (01/07/2019), 6 mg (01/28/2019), 6 mg (02/19/2019) pegfilgrastim-cbqv (UDENYCA) injection 6 mg, 6 mg, Subcutaneous, Once, 1 of 1 cycle Administration: 6 mg (12/16/2018) CARBOplatin (PARAPLATIN) 340 mg in sodium chloride 0.9 % 250 mL chemo infusion, 340 mg (100 % of original dose 335.4 mg), Intravenous,  Once, 4 of 4 cycles Dose modification: 335.4 mg (original dose 335.4 mg, Cycle 1),   (original dose 590.4 mg, Cycle 2), 700 mg (original dose 590.4 mg, Cycle 4, Reason: Provider Judgment) Administration: 340 mg (12/15/2018), 550 mg (01/05/2019), 550 mg (01/26/2019), 700 mg (02/17/2019) etoposide (VEPESID) 160 mg in sodium chloride 0.9 % 500 mL chemo infusion, 75 mg/m2 = 160 mg (100 % of original dose 75 mg/m2), Intravenous,  Once, 4 of 4 cycles Dose modification: 75 mg/m2  (original dose 75 mg/m2, Cycle 1, Reason: Provider Judgment), 50 mg/m2 (original dose 50 mg/m2, Cycle 2, Reason: Change in LFTs) Administration: 160 mg (12/15/2018), 110 mg (01/05/2019), 110 mg (01/07/2019), 110 mg (01/26/2019), 110 mg (01/28/2019), 110 mg (02/17/2019), 110 mg (02/19/2019), 110 mg (01/06/2019), 110 mg (01/27/2019), 110 mg (02/18/2019) fosaprepitant (EMEND) 150 mg, dexamethasone (DECADRON) 12 mg in sodium chloride 0.9 % 145 mL IVPB, , Intravenous,  Once, 4 of 4 cycles Administration:  (12/15/2018),  (01/05/2019),  (01/26/2019),  (02/17/2019) atezolizumab (TECENTRIQ) 1,200 mg in sodium chloride 0.9 % 250 mL chemo infusion, 1,200 mg, Intravenous, Once, 6 of 8 cycles Administration: 1,200 mg (01/05/2019), 1,200 mg (01/26/2019), 1,200 mg (02/17/2019), 1,200 mg (03/09/2019), 1,200 mg (03/30/2019), 1,200 mg (04/20/2019)  for chemotherapy treatment.      History of Present Illness: The patient's records from the referring physician were obtained and reviewed and the patient interviewed to confirm this HPI.  Camauri Pilar Plate Parrales presents today as referral from Dr. Irene Limbo for new neurologic symptoms.  The patient describes numbness of chest well, affecting the left side, wrapping around front to back and stopping right at the midline.  It is difficult to pinpoint the exact level of sensory loss, it seems to primarily affect the lower abdominal region, although he feels "weird" all the way up his armpit at times.  These symptoms have been persistent for several weeks.  In addition, he described recurrence of midline (upper) back pain that had initially resolved completely after completing chemotherapy (reduced dose carbo+etop in may).  This pain is localized and non-radiating.  He is walking ok on his own, but feels a little increase in "unsteadiness" at times, which is  also new.  No issues with urinary urgency. 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   Medications: Current Outpatient Medications on File Prior to Visit  Medication Sig Dispense Refill   docusate sodium (COLACE) 100 MG capsule Take 1 capsule (100 mg total) by mouth 2 (two) times daily. 10 capsule 0   ibuprofen (ADVIL,MOTRIN) 200 MG tablet Take 800 mg by mouth every 6 (six) hours as needed for fever or moderate pain.     LORazepam (ATIVAN) 0.5 MG tablet 1 tab po q 4-6 hours prn or 1 tab po 30 minutes prior to MRI or radiation 30 tablet 0   oxyCODONE (OXY IR/ROXICODONE) 5 MG immediate release tablet Take 1-2 tablets (5-10 mg total) by mouth every 6 (six) hours as needed for moderate pain. 120 tablet 0   oxyCODONE (OXYCONTIN) 10 mg 12 hr tablet Take 1 tablet (10 mg total) by mouth every 12 (twelve) hours. 60 tablet 0   polyethylene glycol (MIRALAX / GLYCOLAX) packet Take 17 g by mouth daily as needed for mild constipation. 14 each 0   memantine (NAMENDA) 5 MG tablet Start on first day of XRT: Week 1: take 1 tablet po q am Week 2: take 1 tablet po q am and 1 tablet po q pm Week 3: take 2 tablets po q am, and 1 tablets po q pm Week 4-24: take 2 tablets po q am, and 2 tablets po q pm.  Stop after 24 weeks 10/09/2019 should be last day 120 tablet 3   ondansetron (ZOFRAN) 8 MG tablet Take 1 tablet (8 mg total) by mouth 2 (two) times daily as needed for refractory nausea / vomiting. Start on day 3 after carboplatin chemo. 30 tablet 1   Current Facility-Administered Medications on File Prior to Visit  Medication Dose Route Frequency Provider Last Rate Last Dose   oxyCODONE (Oxy IR/ROXICODONE) immediate release tablet 5 mg  5 mg Oral Once Brunetta Genera, MD        Allergies: No Known Allergies Past Medical History:  Past Medical History:  Diagnosis Date   Cancer [facility])    Past Surgical History:  Past Surgical History:  Procedure Laterality Date   NO PAST SURGERIES     Social History:  Social History   Socioeconomic History   Marital status: Single     Spouse name: Not on file   Number of children: Not on file   Years of education: Not on file   Highest education level: Not on file  Occupational History   Occupation: Surveyor, quantity  Social Needs   Financial resource strain: Not very hard   Food insecurity    Worry: Never true    Inability: Never true   Transportation needs    Medical: No    Non-medical: No  Tobacco Use   Smoking status: Current Every Day Smoker    Packs/day: 1.00    Years: 45.00    Pack years: 45.00    Types: Cigarettes   Smokeless tobacco: Never Used  Substance and Sexual Activity   Alcohol use: Not Currently    Alcohol/week: 8.0 standard drinks    Types: 3 Cans of beer, 5 Shots of liquor per week    Comment: no alcohol in past month due to symptoms   Drug use: Not Currently    Frequency: 7.0 times per week    Types: Marijuana   Sexual activity: Not Currently  Lifestyle  Physical activity    Days per week: 5 days    Minutes per session: 150+ min   Stress: To some extent  Relationships   Social connections    Talks on phone: More than three times a week    Gets together: More than three times a week    Attends religious service: Never    Active member of club or organization: No    Attends meetings of clubs or organizations: Never    Relationship status: Living with partner   Intimate partner violence    Fear of current or ex partner: No    Emotionally abused: No    Physically abused: No    Forced sexual activity: No  Other Topics Concern   Not on file  Social History Narrative   Not on file   Family History:  Family History  Problem Relation Age of Onset   Breast cancer Sister    Breast cancer Sister     Review of Systems: Constitutional: Denies fevers, chills or abnormal weight loss Eyes: Denies blurriness of vision Ears, nose, mouth, throat, and face: Denies mucositis or sore throat Respiratory: Denies cough, dyspnea or wheezes Cardiovascular: Denies  palpitation, chest discomfort or lower extremity swelling Gastrointestinal:  Denies nausea, constipation, diarrhea GU: Denies dysuria or incontinence Skin: Denies abnormal skin rashes Neurological: Per HPI Musculoskeletal: Denies joint pain, back or neck discomfort. No decrease in ROM Behavioral/Psych: Denies anxiety, disturbance in thought content, and mood instability   Physical Exam: Vitals:   05/07/19 1001  BP: (!) 145/80  Pulse: 60  Resp: 17  Temp: 98.3 F (36.8 C)  SpO2: 99%   KPS: 80. General: Alert, cooperative, pleasant, in no acute distress Head: Craniotomy scar noted, dry and intact. EENT: No conjunctival injection or scleral icterus. Oral mucosa moist Lungs: Resp effort normal Cardiac: Regular rate and rhythm Abdomen: Soft, non-distended abdomen Skin: No rashes cyanosis or petechiae. Extremities: No clubbing or edema  Neurologic Exam: Mental Status: Awake, alert, attentive to examiner. Oriented to self and environment. Language is fluent with intact comprehension.  Cranial Nerves: Visual acuity is grossly normal. Visual fields are full. Extra-ocular movements intact. No ptosis. Face is symmetric, tongue midline. Motor: Tone and bulk are normal. Power is full in both arms and legs. Reflexes are symmetric, no pathologic reflexes present. Intact finger to nose bilaterally Sensory: Impaired to light touch, left chest wall and back, primarily umbilical Gait: Normal and tandem gait is normal.   Labs: I have reviewed the data as listed    Component Value Date/Time   NA 137 04/20/2019 0758   K 4.5 04/20/2019 0758   CL 105 04/20/2019 0758   CO2 23 04/20/2019 0758   GLUCOSE 99 04/20/2019 0758   BUN 14 04/20/2019 0758   CREATININE 0.74 04/20/2019 0758   CALCIUM 9.8 04/20/2019 0758   PROT 7.7 04/20/2019 0758   ALBUMIN 4.1 04/20/2019 0758   AST 17 04/20/2019 0758   ALT 18 04/20/2019 0758   ALKPHOS 61 04/20/2019 0758   BILITOT 0.5 04/20/2019 0758   GFRNONAA >60  04/20/2019 0758   GFRAA >60 04/20/2019 0758   Lab Results  Component Value Date   WBC 7.5 04/20/2019   NEUTROABS 4.8 04/20/2019   HGB 14.2 04/20/2019   HCT 42.2 04/20/2019   MCV 97.7 04/20/2019   PLT 186 04/20/2019    Imaging:  Dg Ribs Bilateral W/chest  Result Date: 04/20/2019 CLINICAL DATA:  Right-sided chest wall pain. Fell. History of metastatic small cell lung cancer.  EXAM: BILATERAL RIBS AND CHEST - 4+ VIEW COMPARISON:  Chest CT 03/03/2019 FINDINGS: The cardiac silhouette, mediastinal and hilar contours are within normal limits and stable. Left upper lobe scarring changes are noted and likely related to prior radiation. Bilateral nipple shadows but no worrisome pulmonary lesions. Underlying emphysematous changes. There is a left-sided rib fracture involving the seventh anterolateral rib. Seventh and eighth right rib fractures are also noted. No pleural effusions or pneumothorax. IMPRESSION: Bilateral nondisplaced rib fractures. No acute pulmonary findings. Electronically Signed   By: Marijo Sanes M.D.   On: 04/20/2019 12:34   Dg Thoracic Spine 2 View  Result Date: 04/20/2019 CLINICAL DATA:  Back pain.  Fell. EXAM: THORACIC SPINE 2 VIEWS COMPARISON:  Chest CT 03/03/2019 FINDINGS: CT scan 03/03/2019 moderate to advanced lumbar spine degenerative changes for age. No acute fracture or obvious bone lesions. No significant paraspinal findings. IMPRESSION: Normal alignment and no acute bony findings. Electronically Signed   By: Marijo Sanes M.D.   On: 04/20/2019 12:38   Mr Jeri Cos FR Contrast  Result Date: 04/16/2019 CLINICAL DATA:  Small cell lung cancer staging EXAM: MRI HEAD WITHOUT AND WITH CONTRAST TECHNIQUE: Multiplanar, multiecho pulse sequences of the brain and surrounding structures were obtained without and with intravenous contrast. CONTRAST:  8 mL Gadavist COMPARISON:  None. FINDINGS: BRAIN: There is no acute infarct, acute hemorrhage or extra-axial collection. The white matter  signal is normal for the patient's age. The cerebral and cerebellar volume are age-appropriate. There is no hydrocephalus. The midline structures are normal. There is no midline shift or mass effect. Susceptibility-sensitive sequences show no chronic microhemorrhage or superficial siderosis. There are multiple contrast-enhancing lesions: 1. Right cerebellum 7 x 7 mm, series 12, image 12 2. Left cerebellum 3 mm, image 13 3. Midline pons, 2 mm, image 20 4. Ventral right pons 5 x 4 mm, image 23 5. Left ventral pons 3 mm, image 25 6. Right occipital lobe 3 mm, image 29 7. Left thalamus, 4 mm, image 44 left occipital lobe 4.7 mm, image 44 8. Right frontal lobe, 2 mm, image 66 VASCULAR: The major intracranial arterial and venous sinus flow voids are normal. SKULL AND UPPER CERVICAL SPINE: Calvarial bone marrow signal is normal. There is no skull base mass. The visualized upper cervical spine and soft tissues are normal. SINUSES/ORBITS: Moderate bilateral maxillary sinus mucosal thickening. The orbits are normal. 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 No significant edema or mass effect. Electronically Signed   By: Ulyses Jarred M.D.   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 Kretschmer presents today with a clinical syndrome suggestive of thoracic radiculopathy, likely secondary to metastasis from small cell lung cancer.  Clearly demonstrated on thoracic MRI from March 2020 is tumor encroaching into the epidural space at T10 lateral to the cord which correlates with current symptomatology.   It is unclear if his back pain is referred from this region, but there is clear temporal association and recurrence of symptoms in accordance with cytotoxic chemotherapy.  The pain he is experiencing at this time is identical to symptoms he was experiencing in March at the time of this scan.   At that time, however, he was not experiencing radicular symptoms.  MRI was performed after lesions were discovered incidentally on abdominal CT imaging.  Our recommendation at this time is to repeat the MRI Thoracic Spine study ASAP, hopefully he will be able  to tolerate full scan with contrast.  We feel it is likely this study will reveal target(s) that could be approached with localized radiotherapy for symptomatic control.  He is not a good candidate for further chemotherapy because of very poor liver function.  We spent twenty additional minutes teaching regarding the natural history, biology, and historical experience in the treatment of neurologic complications of cancer. We also provided teaching sheets for the patient to take home as an additional resource.  We appreciate the opportunity to participate in the care of Delevan.  We will discuss his case in brain/spine tumor board once scan is completed, and reach out to him with recommendations for care moving forward.  In the meantime we are happy to follow up with him as needed.  All questions were answered. The patient knows to call the clinic with any problems, questions or concerns. No barriers to learning were detected.  The total time spent in the encounter was 40 minutes and more than 50% was on counseling and review of test results   Ventura Sellers, MD Medical Director of Neuro-Oncology [facility] at [place] 05/08/19 9:43 AM

## 2019-05-10 NOTE — Progress Notes (Signed)
HEMATOLOGY/ONCOLOGY CLINIC NOTE  Date of Service: 05/10/2019  Patient Care Team: Default, Provider, MD as PCP - General  CHIEF COMPLAINTS/PURPOSE OF CONSULTATION:  F/u for Small Cell Lung Cancer  HISTORY OF PRESENTING ILLNESS:  Cody Montoya a 63 y.o.malewith medical history significant ofalcohol abuse.He does not have a primary care provider and does not seek routine medical care. Around 3 weeks ago, the patient started having epigastric abdominal pain. It would wax and wane in severity but progressively worsenedsince onset. He has had associated nausea and vomiting. His appetite has decreased and he is lost around 10 pounds. He stopped drinking when this happened. He would drink around 7-8 shots of vodka daily in addition to the 2-3 beers. He has done this for the past 4-5 years. He does not have any known history of hepatitis B or C and denies taking any medication/supplements routinely. He denies any prominent vessels, yellowing of the skin/eyes, fevers, shortness of breath, or bowel changes. The patient has been using ibuprofen for pain with little relief.  In the emergency room, a CT scan showsmultifocal hepatocellular carcinoma that is not appear to be metastatic to the liver. There is also thoracic adenopathy,omental/peritoneal involvement and left pleural effusion suggestive of metastatic disease. The oncology team was called and recommended admission for work-up.   When seen today, the patient reports that he continues to have abdominal discomfort in his upper abdomen.  Along with the discomfort in his abdomen, he also reports nausea and vomiting and increased constipation.  This is been present for about 2 to 3 weeks.  He states his appetite has been decreased and he has lost weight.  He estimates that he has lost about 10 pounds over the past few months.  Oncology was asked to see the patient to make recommendations regarding his liver  masses.  Interval History:   Cody Montoya is here for follow-up of his extensive stage small cell lung cancer. The patient's last visit with [place] was on 04/20/2019. The pt reports that he is doing well overall.  The pt reports that his medication is not working as well as it used to. His back pain has worsened. He has been taking Ibuprofen and "Doans", which works well. He reports that radiation makes him feel woozy, but that tomorrow is his last appointment. He has not started Memantine because it is too expensive for him.  He saw Dr. Mickeal Skinner for his left side numbness and is scheduled for an MRI thoracic spine on 05/13/2019. He is able to move around without a cane or walker.  Lab results today (05/11/2019) of CBC w/diff and CMP is as follows: all values are WNL except for glucose bld at 103. 05/11/2019 TSH is pending 05/11/2019 T4 is pending 05/11/2019 Magnesium at 1.8  On review of systems, pt reports worsening back pain, left side numbness, and denies weakness and any other symptoms.   MEDICAL HISTORY:  Past Medical History:  Diagnosis Date   Cancer [facility])     SURGICAL HISTORY: Past Surgical History:  Procedure Laterality Date   NO PAST SURGERIES      SOCIAL HISTORY: Social History   Socioeconomic History   Marital status: Single    Spouse name: Not on file   Number of children: Not on file   Years of education: Not on file   Highest education level: Not on file  Occupational History   Occupation: Surveyor, quantity  Social Needs   Financial resource strain: Not very hard  Food insecurity    Worry: Never true    Inability: Never true   Transportation needs    Medical: No    Non-medical: No  Tobacco Use   Smoking status: Current Every Day Smoker    Packs/day: 1.00    Years: 45.00    Pack years: 45.00    Types: Cigarettes   Smokeless tobacco: Never Used  Substance and Sexual Activity   Alcohol use: Not Currently    Alcohol/week: 8.0  standard drinks    Types: 3 Cans of beer, 5 Shots of liquor per week    Comment: no alcohol in past month due to symptoms   Drug use: Not Currently    Frequency: 7.0 times per week    Types: Marijuana   Sexual activity: Not Currently  Lifestyle   Physical activity    Days per week: 5 days    Minutes per session: 150+ min   Stress: To some extent  Relationships   Social connections    Talks on phone: More than three times a week    Gets together: More than three times a week    Attends religious service: Never    Active member of club or organization: No    Attends meetings of clubs or organizations: Never    Relationship status: Living with partner   Intimate partner violence    Fear of current or ex partner: No    Emotionally abused: No    Physically abused: No    Forced sexual activity: No  Other Topics Concern   Not on file  Social History Narrative   Not on file    FAMILY HISTORY: Family History  Problem Relation Age of Onset   Breast cancer Sister    Breast cancer Sister     ALLERGIES:  has No Known Allergies.  MEDICATIONS:  Current Outpatient Medications  Medication Sig Dispense Refill   docusate sodium (COLACE) 100 MG capsule Take 1 capsule (100 mg total) by mouth 2 (two) times daily. 10 capsule 0   ibuprofen (ADVIL,MOTRIN) 200 MG tablet Take 800 mg by mouth every 6 (six) hours as needed for fever or moderate pain.     LORazepam (ATIVAN) 0.5 MG tablet 1 tab po q 4-6 hours prn or 1 tab po 30 minutes prior to MRI or radiation 30 tablet 0   memantine (NAMENDA) 5 MG tablet Start on first day of XRT: Week 1: take 1 tablet po q am Week 2: take 1 tablet po q am and 1 tablet po q pm Week 3: take 2 tablets po q am, and 1 tablets po q pm Week 4-24: take 2 tablets po q am, and 2 tablets po q pm.  Stop after 24 weeks 10/09/2019 should be last day 120 tablet 3   ondansetron (ZOFRAN) 8 MG tablet Take 1 tablet (8 mg total) by mouth 2 (two) times daily as needed  for refractory nausea / vomiting. Start on day 3 after carboplatin chemo. 30 tablet 1   oxyCODONE (OXY IR/ROXICODONE) 5 MG immediate release tablet Take 1-2 tablets (5-10 mg total) by mouth every 6 (six) hours as needed for moderate pain. 120 tablet 0   oxyCODONE (OXYCONTIN) 10 mg 12 hr tablet Take 1 tablet (10 mg total) by mouth every 12 (twelve) hours. 60 tablet 0   polyethylene glycol (MIRALAX / GLYCOLAX) packet Take 17 g by mouth daily as needed for mild constipation. 14 each 0   No current facility-administered medications for this visit.  Facility-Administered Medications Ordered in Other Visits  Medication Dose Route Frequency Provider Last Rate Last Dose   oxyCODONE (Oxy IR/ROXICODONE) immediate release tablet 5 mg  5 mg Oral Once Brunetta Genera, MD        REVIEW OF SYSTEMS:    A 10+ POINT REVIEW OF SYSTEMS WAS OBTAINED including neurology, dermatology, psychiatry, cardiac, respiratory, lymph, extremities, GI, GU, Musculoskeletal, constitutional, breasts, reproductive, HEENT.  All pertinent positives are noted in the HPI.  All others are negative.   PHYSICAL EXAMINATION: ECOG PERFORMANCE STATUS: 1-2  GENERAL:alert, in no acute distress and comfortable SKIN: no acute rashes, no significant lesions EYES: conjunctiva are pink and non-injected, sclera anicteric OROPHARYNX: MMM, no exudates, no oropharyngeal erythema or ulceration NECK: supple, no JVD LYMPH:  no palpable lymphadenopathy in the cervical, axillary or inguinal regions LUNGS: clear to auscultation b/l with normal respiratory effort HEART: regular rate & rhythm ABDOMEN:  Improved hepatomegaly about 1-2 fingerbreadths below costal line, no TTP, negative Murphy's sign Extremity: no pedal edema PSYCH: alert & oriented x 3 with fluent speech NEURO: no focal motor/sensory deficits  BACK: mild tenderness in mid-thoracic spine  LABORATORY DATA:  I have reviewed the data as listed  . CBC Latest Ref Rng & Units  04/20/2019 03/30/2019 03/09/2019  WBC 4.0 - 10.5 K/uL 7.5 5.8 8.0  Hemoglobin 13.0 - 17.0 g/dL 14.2 14.0 13.6  Hematocrit 39.0 - 52.0 % 42.2 41.0 41.7  Platelets 150 - 400 K/uL 186 218 212    . CMP Latest Ref Rng & Units 04/20/2019 03/30/2019 03/09/2019  Glucose 70 - 99 mg/dL 99 96 88  BUN 8 - 23 mg/dL 14 8 10   Creatinine 0.61 - 1.24 mg/dL 0.74 0.66 0.65  Sodium 135 - 145 mmol/L 137 138 138  Potassium 3.5 - 5.1 mmol/L 4.5 4.0 4.0  Chloride 98 - 111 mmol/L 105 107 108  CO2 22 - 32 mmol/L 23 23 21(L)  Calcium 8.9 - 10.3 mg/dL 9.8 9.0 9.1  Total Protein 6.5 - 8.1 g/dL 7.7 7.4 7.3  Total Bilirubin 0.3 - 1.2 mg/dL 0.5 0.4 0.4  Alkaline Phos 38 - 126 U/L 61 78 96  AST 15 - 41 U/L 17 19 14(L)  ALT 0 - 44 U/L 18 17 15        RADIOGRAPHIC STUDIES: I have personally reviewed the radiological images as listed and agreed with the findings in the report. Dg Ribs Bilateral W/chest  Result Date: 04/20/2019 CLINICAL DATA:  Right-sided chest wall pain. Fell. History of metastatic small cell lung cancer. EXAM: BILATERAL RIBS AND CHEST - 4+ VIEW COMPARISON:  Chest CT 03/03/2019 FINDINGS: The cardiac silhouette, mediastinal and hilar contours are within normal limits and stable. Left upper lobe scarring changes are noted and likely related to prior radiation. Bilateral nipple shadows but no worrisome pulmonary lesions. Underlying emphysematous changes. There is a left-sided rib fracture involving the seventh anterolateral rib. Seventh and eighth right rib fractures are also noted. No pleural effusions or pneumothorax. IMPRESSION: Bilateral nondisplaced rib fractures. No acute pulmonary findings. Electronically Signed   By: Marijo Sanes M.D.   On: 04/20/2019 12:34   Dg Thoracic Spine 2 View  Result Date: 04/20/2019 CLINICAL DATA:  Back pain.  Fell. EXAM: THORACIC SPINE 2 VIEWS COMPARISON:  Chest CT 03/03/2019 FINDINGS: CT scan 03/03/2019 moderate to advanced lumbar spine degenerative changes for age. No acute  fracture or obvious bone lesions. No significant paraspinal findings. IMPRESSION: Normal alignment and no acute bony findings. Electronically Signed   By:  Marijo Sanes M.D.   On: 04/20/2019 12:38   Mr Jeri Cos ZO Contrast  Result Date: 04/16/2019 CLINICAL DATA:  Small cell lung cancer staging EXAM: MRI HEAD WITHOUT AND WITH CONTRAST TECHNIQUE: Multiplanar, multiecho pulse sequences of the brain and surrounding structures were obtained without and with intravenous contrast. CONTRAST:  8 mL Gadavist COMPARISON:  None. FINDINGS: BRAIN: There is no acute infarct, acute hemorrhage or extra-axial collection. The white matter signal is normal for the patient's age. The cerebral and cerebellar volume are age-appropriate. There is no hydrocephalus. The midline structures are normal. There is no midline shift or mass effect. Susceptibility-sensitive sequences show no chronic microhemorrhage or superficial siderosis. There are multiple contrast-enhancing lesions: 1. Right cerebellum 7 x 7 mm, series 12, image 12 2. Left cerebellum 3 mm, image 13 3. Midline pons, 2 mm, image 20 4. Ventral right pons 5 x 4 mm, image 23 5. Left ventral pons 3 mm, image 25 6. Right occipital lobe 3 mm, image 29 7. Left thalamus, 4 mm, image 44 left occipital lobe 4.7 mm, image 44 8. Right frontal lobe, 2 mm, image 66 VASCULAR: The major intracranial arterial and venous sinus flow voids are normal. SKULL AND UPPER CERVICAL SPINE: Calvarial bone marrow signal is normal. There is no skull base mass. The visualized upper cervical spine and soft tissues are normal. SINUSES/ORBITS: Moderate bilateral maxillary sinus mucosal thickening. The orbits are normal. 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 No significant edema or mass effect. Electronically Signed   By: Ulyses Jarred M.D.   On: 04/16/2019 22:37    ASSESSMENT & PLAN:   63 y.o. male with  1. Recently diagnosed Extensive Stage Small Cell  Carcinoma 01/13/19 CT Head revealed "No evidence of intracranial metastatic disease on this unenhanced study. 2. Mild chronic small vessel ischemic disease."   2.  Abnormal liver function tests.  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  Bump in bilirubin could be from progression of his liver mets.   12/18/18 US Abdomen revealed Widespread metastatic disease throughout the liver. The liver has a somewhat nodular contour suggesting underlying cirrhosis. 2. No gallstones seen. Gallbladder wall appears mildly thickened and edematous. Sludge is noted in the gallbladder. There is also prominence of the common bile duct without mass or calculus evident. These findings raise concern for potential degree of acalculus cholecystitis. In this regard, it may be prudent to consider nuclear medicine hepatobiliary imaging study to assess for cystic duct patency.  01/02/19 NM Hepatobiliary including GB revealed No biliary obstruction. 2. Normal hepatobiliary scan.  S/p 4 cycles of Carboplatin AUC of 5, 50mg /m2 Etoposide, and Atezolizumab at this time.  03/03/19 CT C/A/P revealed "Substantial partial treatment response as detailed below. No new or progressive metastatic disease. 2. Central left upper lobe lung mass is substantially decreased in size. 3. Mediastinal adenopathy is substantially decreased, with residual mild AP window adenopathy. Retroperitoneal adenopathy has resolved. 4. Liver metastases are decreased. 5. Stable faintly sclerotic osseous lesions in the spine and left iliac wing. 6. Stable irregular thickening of the adrenal glands without discrete adrenal nodules. 7. Aortic Atherosclerosis."  Previously discussed the NCCN guidelines of 4-6 cycles of treatment, followed by maintenance immunotherapy. Pt's disease has responded well, and discussed risks vs benefits of completing 1-2 additional cycles (pt has completed 4 cycles thus far), in setting of patient's  liver cirrhosis and palliative treatment. Discussed patient's goals of care. Patient's goals are to stop chemotherapy treatment after C4,  and will focus on spending quality time with friends and family.  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 No significant edema or mass effect."  PLAN: -Discussed pt labwork today, 05/11/2019 -Discussed that if there is possibility of progression, will have to increase steroids. -The pt has no prohibitive toxicities from continuing maintenance Atezolizumab every 3 weeks, at this time. -Will continue Atezolizumab every 3 weeks until progression or intolerance -Continue Xgeva every 4 weeks for bone strengthening, denies dental pains, discussed risk of non-healing ulcers -Continue immunotherapy -Continue Oxycontin 10 mg every 12 hrs and Oxycodone IR 5-10 mg every 6 hrs -Recommend starting Memantine as prescribed by radiation oncology - which hasnt yet been taking, goodRx coupon was given to the pt today. -Pt has an MRI thoracic spine scheduled for . Has seen neuro-oncology Dr Mickeal Skinner appreciate input. Might have role for RT if spinal involvement with nerve compression. -Will see the pt back in 3 weeks   -F/u as per scheduled appointments for next Atezolizumab with labs and MD visit in 3 weeks  -Continue Xgeva every 4 weeks    All of the patients questions were answered with apparent satisfaction. The patient knows to call the clinic with any problems, questions or concerns.  The total time spent in the appt was 25 minutes and more than 50% was on counseling and direct patient cares.  Sullivan Lone MD [place] AAHIVMS [facility] Hematology/Oncology [facility]  (Office):       [phone] (Work cell):  [phone] (Fax):           [phone]  I, De Burrs, am acting as a scribe for Dr. Irene Limbo  .I have reviewed the above documentation for accuracy and  completeness, and I agree with the above. Brunetta Genera MD

## 2019-05-11 ENCOUNTER — Telehealth: Payer: Self-pay | Admitting: Hematology

## 2019-05-11 ENCOUNTER — Ambulatory Visit
Admission: RE | Admit: 2019-05-11 | Discharge: 2019-05-11 | Disposition: A | Discharge status: Home / Self Care | Payer: Medicaid Other | Source: Ambulatory Visit | Attending: Radiation Oncology | Admitting: Radiation Oncology

## 2019-05-11 ENCOUNTER — Inpatient Hospital Stay (HOSPITAL_BASED_OUTPATIENT_CLINIC_OR_DEPARTMENT_OTHER): Payer: Medicaid Other | Admitting: Hematology

## 2019-05-11 ENCOUNTER — Inpatient Hospital Stay: Payer: Medicaid Other

## 2019-05-11 ENCOUNTER — Other Ambulatory Visit: Payer: Self-pay

## 2019-05-11 VITALS — BP 126/76 | HR 61 | Temp 98.5°F | Resp 18

## 2019-05-11 DIAGNOSIS — Z7189 Other specified counseling: Secondary | ICD-10-CM

## 2019-05-11 DIAGNOSIS — C3490 Malignant neoplasm of unspecified part of unspecified bronchus or lung: Secondary | ICD-10-CM

## 2019-05-11 DIAGNOSIS — C787 Secondary malignant neoplasm of liver and intrahepatic bile duct: Secondary | ICD-10-CM

## 2019-05-11 DIAGNOSIS — Z510 Encounter for antineoplastic radiation therapy: Secondary | ICD-10-CM | POA: Diagnosis not present

## 2019-05-11 DIAGNOSIS — Z5112 Encounter for antineoplastic immunotherapy: Secondary | ICD-10-CM

## 2019-05-11 DIAGNOSIS — C7951 Secondary malignant neoplasm of bone: Secondary | ICD-10-CM

## 2019-05-11 LAB — CMP (CANCER CENTER ONLY)
ALT: 24 U/L (ref 0–44)
AST: 35 U/L (ref 15–41)
Albumin: 4.3 g/dL (ref 3.5–5.0)
Alkaline Phosphatase: 81 U/L (ref 38–126)
Anion gap: 10 (ref 5–15)
BUN: 12 mg/dL (ref 8–23)
CO2: 25 mmol/L (ref 22–32)
Calcium: 9.6 mg/dL (ref 8.9–10.3)
Chloride: 100 mmol/L (ref 98–111)
Creatinine: 0.79 mg/dL (ref 0.61–1.24)
GFR, Est AFR Am: >60 mL/min (ref >60)
GFR, Estimated: >60 mL/min (ref >60)
Glucose, Bld: 103 mg/dL — ABNORMAL HIGH (ref 70–99)
Potassium: 4.1 mmol/L (ref 3.5–5.1)
Sodium: 135 mmol/L (ref 135–145)
Total Bilirubin: 0.7 mg/dL (ref 0.3–1.2)
Total Protein: 8 g/dL (ref 6.5–8.1)

## 2019-05-11 LAB — CBC WITH DIFFERENTIAL (CANCER CENTER ONLY)
Abs Immature Granulocytes: 0.01 10*3/uL (ref 0.00–0.07)
Basophils Absolute: 0 10*3/uL (ref 0.0–0.1)
Basophils Relative: 0 %
Eosinophils Absolute: 0 10*3/uL (ref 0.0–0.5)
Eosinophils Relative: 1 %
HCT: 41.4 % (ref 39.0–52.0)
Hemoglobin: 14 g/dL (ref 13.0–17.0)
Immature Granulocytes: 0 %
Lymphocytes Relative: 22 %
Lymphs Abs: 1.2 10*3/uL (ref 0.7–4.0)
MCH: 32.3 pg (ref 26.0–34.0)
MCHC: 33.8 g/dL (ref 30.0–36.0)
MCV: 95.6 fL (ref 80.0–100.0)
Monocytes Absolute: 0.5 10*3/uL (ref 0.1–1.0)
Monocytes Relative: 9 %
Neutro Abs: 3.6 10*3/uL (ref 1.7–7.7)
Neutrophils Relative %: 68 %
Platelet Count: 177 10*3/uL (ref 150–400)
RBC: 4.33 MIL/uL (ref 4.22–5.81)
RDW: 11.9 % (ref 11.5–15.5)
WBC Count: 5.2 10*3/uL (ref 4.0–10.5)
nRBC: 0 % (ref 0.0–0.2)

## 2019-05-11 LAB — TSH: TSH: 0.921 u[IU]/mL (ref 0.320–4.118)

## 2019-05-11 LAB — MAGNESIUM: Magnesium: 1.8 mg/dL (ref 1.7–2.4)

## 2019-05-11 MED ORDER — DENOSUMAB 120 MG/1.7ML ~~LOC~~ SOLN
120.0000 mg | Freq: Once | SUBCUTANEOUS | Status: AC
  Administered 2019-05-11: 120 mg via SUBCUTANEOUS

## 2019-05-11 MED ORDER — DIPHENHYDRAMINE HCL 25 MG PO TABS
25.0000 mg | ORAL_TABLET | Freq: Once | ORAL | Status: AC
  Administered 2019-05-11: 25 mg via ORAL
  Filled 2019-05-11: qty 1

## 2019-05-11 MED ORDER — ACETAMINOPHEN 325 MG PO TABS
650.0000 mg | ORAL_TABLET | Freq: Once | ORAL | Status: AC
  Administered 2019-05-11: 650 mg via ORAL

## 2019-05-11 MED ORDER — DENOSUMAB 120 MG/1.7ML ~~LOC~~ SOLN
SUBCUTANEOUS | Status: AC
  Filled 2019-05-11: qty 1.7

## 2019-05-11 MED ORDER — ACETAMINOPHEN 325 MG PO TABS
ORAL_TABLET | ORAL | Status: AC
  Filled 2019-05-11: qty 2

## 2019-05-11 MED ORDER — FAMOTIDINE 20 MG PO TABS
20.0000 mg | ORAL_TABLET | Freq: Once | ORAL | Status: AC
  Administered 2019-05-11: 20 mg via ORAL

## 2019-05-11 MED ORDER — SODIUM CHLORIDE 0.9 % IV SOLN
1200.0000 mg | Freq: Once | INTRAVENOUS | Status: AC
  Administered 2019-05-11: 1200 mg via INTRAVENOUS
  Filled 2019-05-11: qty 20

## 2019-05-11 MED ORDER — FAMOTIDINE 20 MG PO TABS
ORAL_TABLET | ORAL | Status: AC
  Filled 2019-05-11: qty 1

## 2019-05-11 MED ORDER — SODIUM CHLORIDE 0.9 % IV SOLN
Freq: Once | INTRAVENOUS | Status: AC
  Administered 2019-05-11: 12:00:00 via INTRAVENOUS
  Filled 2019-05-11: qty 250

## 2019-05-11 MED ORDER — DIPHENHYDRAMINE HCL 25 MG PO CAPS
ORAL_CAPSULE | ORAL | Status: AC
  Filled 2019-05-11: qty 1

## 2019-05-11 NOTE — Patient Instructions (Signed)
[facility] Discharge Instructions for Patients Receiving Chemotherapy  Today you received the following chemotherapy agents: tecentriq  To help prevent nausea and vomiting after your treatment, we encourage you to take your nausea medication as directed.    If you develop nausea and vomiting that is not controlled by your nausea medication, call the clinic.   BELOW ARE SYMPTOMS THAT SHOULD BE REPORTED IMMEDIATELY:  *FEVER GREATER THAN 100.5 F  *CHILLS WITH OR WITHOUT FEVER  NAUSEA AND VOMITING THAT IS NOT CONTROLLED WITH YOUR NAUSEA MEDICATION  *UNUSUAL SHORTNESS OF BREATH  *UNUSUAL BRUISING OR BLEEDING  TENDERNESS IN MOUTH AND THROAT WITH OR WITHOUT PRESENCE OF ULCERS  *URINARY PROBLEMS  *BOWEL PROBLEMS  UNUSUAL RASH Items with * indicate a potential emergency and should be followed up as soon as possible.  Feel free to call the clinic should you have any questions or concerns. The clinic phone number is [phone].  Please show the [place] at check-in to the Emergency Department and triage nurse.

## 2019-05-11 NOTE — Telephone Encounter (Signed)
Scheduled appt per 8/17 los.

## 2019-05-12 ENCOUNTER — Other Ambulatory Visit: Payer: Self-pay

## 2019-05-12 ENCOUNTER — Other Ambulatory Visit: Payer: Self-pay | Admitting: Radiation Oncology

## 2019-05-12 ENCOUNTER — Ambulatory Visit
Admission: RE | Admit: 2019-05-12 | Discharge: 2019-05-12 | Disposition: A | Discharge status: Home / Self Care | Payer: Medicaid Other | Source: Ambulatory Visit | Attending: Radiation Oncology | Admitting: Radiation Oncology

## 2019-05-12 ENCOUNTER — Other Ambulatory Visit: Payer: Self-pay | Admitting: Radiation Therapy

## 2019-05-12 ENCOUNTER — Encounter: Payer: Self-pay | Admitting: Radiation Oncology

## 2019-05-12 DIAGNOSIS — Z510 Encounter for antineoplastic radiation therapy: Secondary | ICD-10-CM | POA: Diagnosis not present

## 2019-05-12 LAB — T4: T4, Total: 8.7 ug/dL (ref 4.5–12.0)

## 2019-05-12 MED ORDER — LORAZEPAM 0.5 MG PO TABS: ORAL_TABLET | ORAL | 0 refills | Status: AC

## 2019-05-13 ENCOUNTER — Other Ambulatory Visit: Payer: Self-pay | Admitting: *Deleted

## 2019-05-13 ENCOUNTER — Ambulatory Visit (HOSPITAL_COMMUNITY): Payer: Self-pay

## 2019-05-13 MED ORDER — OXYCODONE HCL 5 MG PO TABS: 5.0000 mg | ORAL_TABLET | Freq: Four times a day (QID) | ORAL | 0 refills | Status: DC | PRN

## 2019-05-13 NOTE — Telephone Encounter (Signed)
Requested refill of Hydrocodone. States it is early,but will be out over weekend.

## 2019-05-14 DIAGNOSIS — C7931 Secondary malignant neoplasm of brain: Secondary | ICD-10-CM | POA: Insufficient documentation

## 2019-05-14 NOTE — Progress Notes (Signed)
  Radiation Oncology         [phone] ________________________________  Name: Mazin Emma MRN: [id number]  Date: 04/22/2019  DOB: [birth year]    Simulation and treatment planning note  DIAGNOSIS:     ICD-10-CM   1. Brain metastasis ([place])  C79.31      The patient presented for simulation for the patient's upcoming course of whole brain radiation treatment. The patient was placed in a supine position and a customized thermoplastic head cast was constructed to aid in patient immobilization during the treatment. This complex treatment device will be used on a daily basis. In this fashion a CT scan was obtained through the head and neck region and isocenter was placed near midline within the brain.  The patient will be planned to receive a course of whole brain radiation treatment to a dose of 30 gray in 10 fractions at 3 gray per fraction. To accomplish this, 2 customized blocks have been designed which corresponds to left and right whole brain radiation fields. These 2 complex treatment devices will be used on a daily basis during the course of radiation. A complex isodose plan is requested to insure that the target area is adequately covered in to facilitate optimization of the treatment plan. A forward planning technique will also be evaluated to determine if this approach significantly improves the plan.   ________________________________   Jodelle Gross, MD, PhD

## 2019-05-16 ENCOUNTER — Other Ambulatory Visit: Payer: Self-pay

## 2019-05-16 ENCOUNTER — Emergency Department (HOSPITAL_COMMUNITY): Payer: Medicaid Other

## 2019-05-16 ENCOUNTER — Inpatient Hospital Stay (HOSPITAL_COMMUNITY)
Admission: EM | Admit: 2019-05-16 | Discharge: 2019-05-24 | DRG: 948 | Disposition: A | Discharge status: Home / Self Care | Payer: Medicaid Other | Attending: Family Medicine | Admitting: Family Medicine

## 2019-05-16 DIAGNOSIS — K5900 Constipation, unspecified: Secondary | ICD-10-CM | POA: Diagnosis present

## 2019-05-16 DIAGNOSIS — F1011 Alcohol abuse, in remission: Secondary | ICD-10-CM | POA: Diagnosis present

## 2019-05-16 DIAGNOSIS — Z803 Family history of malignant neoplasm of breast: Secondary | ICD-10-CM

## 2019-05-16 DIAGNOSIS — M4802 Spinal stenosis, cervical region: Secondary | ICD-10-CM | POA: Diagnosis present

## 2019-05-16 DIAGNOSIS — C7951 Secondary malignant neoplasm of bone: Secondary | ICD-10-CM | POA: Diagnosis present

## 2019-05-16 DIAGNOSIS — C3490 Malignant neoplasm of unspecified part of unspecified bronchus or lung: Secondary | ICD-10-CM

## 2019-05-16 DIAGNOSIS — M5414 Radiculopathy, thoracic region: Secondary | ICD-10-CM

## 2019-05-16 DIAGNOSIS — Z923 Personal history of irradiation: Secondary | ICD-10-CM

## 2019-05-16 DIAGNOSIS — C220 Liver cell carcinoma: Secondary | ICD-10-CM | POA: Diagnosis present

## 2019-05-16 DIAGNOSIS — F17210 Nicotine dependence, cigarettes, uncomplicated: Secondary | ICD-10-CM | POA: Diagnosis present

## 2019-05-16 DIAGNOSIS — G893 Neoplasm related pain (acute) (chronic): Principal | ICD-10-CM | POA: Diagnosis present

## 2019-05-16 DIAGNOSIS — Z9221 Personal history of antineoplastic chemotherapy: Secondary | ICD-10-CM

## 2019-05-16 DIAGNOSIS — M5114 Intervertebral disc disorders with radiculopathy, thoracic region: Secondary | ICD-10-CM | POA: Diagnosis present

## 2019-05-16 DIAGNOSIS — K7460 Unspecified cirrhosis of liver: Secondary | ICD-10-CM | POA: Diagnosis present

## 2019-05-16 DIAGNOSIS — C7931 Secondary malignant neoplasm of brain: Secondary | ICD-10-CM | POA: Diagnosis present

## 2019-05-16 DIAGNOSIS — C787 Secondary malignant neoplasm of liver and intrahepatic bile duct: Secondary | ICD-10-CM | POA: Diagnosis present

## 2019-05-16 DIAGNOSIS — Z20828 Contact with and (suspected) exposure to other viral communicable diseases: Secondary | ICD-10-CM | POA: Diagnosis present

## 2019-05-16 DIAGNOSIS — C799 Secondary malignant neoplasm of unspecified site: Secondary | ICD-10-CM | POA: Diagnosis present

## 2019-05-16 DIAGNOSIS — G9589 Other specified diseases of spinal cord: Secondary | ICD-10-CM

## 2019-05-16 DIAGNOSIS — Z791 Long term (current) use of non-steroidal anti-inflammatories (NSAID): Secondary | ICD-10-CM

## 2019-05-16 DIAGNOSIS — Z79891 Long term (current) use of opiate analgesic: Secondary | ICD-10-CM

## 2019-05-16 DIAGNOSIS — M546 Pain in thoracic spine: Secondary | ICD-10-CM

## 2019-05-16 DIAGNOSIS — E871 Hypo-osmolality and hyponatremia: Secondary | ICD-10-CM

## 2019-05-16 DIAGNOSIS — Z79899 Other long term (current) drug therapy: Secondary | ICD-10-CM

## 2019-05-16 DIAGNOSIS — R937 Abnormal findings on diagnostic imaging of other parts of musculoskeletal system: Secondary | ICD-10-CM

## 2019-05-16 DIAGNOSIS — E860 Dehydration: Secondary | ICD-10-CM | POA: Diagnosis present

## 2019-05-16 LAB — COMPREHENSIVE METABOLIC PANEL
ALT: 35 U/L (ref 0–44)
AST: 49 U/L — ABNORMAL HIGH (ref 15–41)
Albumin: 4.5 g/dL (ref 3.5–5.0)
Alkaline Phosphatase: 96 U/L (ref 38–126)
Anion gap: 11 (ref 5–15)
BUN: 16 mg/dL (ref 8–23)
CO2: 24 mmol/L (ref 22–32)
Calcium: 8.7 mg/dL — ABNORMAL LOW (ref 8.9–10.3)
Chloride: 93 mmol/L — ABNORMAL LOW (ref 98–111)
Creatinine, Ser: 0.65 mg/dL (ref 0.61–1.24)
GFR calc Af Amer: >60 mL/min (ref >60)
GFR calc non Af Amer: >60 mL/min (ref >60)
Glucose, Bld: 93 mg/dL (ref 70–99)
Potassium: 3.9 mmol/L (ref 3.5–5.1)
Sodium: 128 mmol/L — ABNORMAL LOW (ref 135–145)
Total Bilirubin: 1.3 mg/dL — ABNORMAL HIGH (ref 0.3–1.2)
Total Protein: 8.6 g/dL — ABNORMAL HIGH (ref 6.5–8.1)

## 2019-05-16 LAB — URINALYSIS, ROUTINE W REFLEX MICROSCOPIC
Bilirubin Urine: NEGATIVE
Glucose, UA: NEGATIVE mg/dL
Hgb urine dipstick: NEGATIVE
Ketones, ur: 20 mg/dL — AB
Leukocytes,Ua: NEGATIVE
Nitrite: NEGATIVE
Protein, ur: NEGATIVE mg/dL
Specific Gravity, Urine: 1.02 (ref 1.005–1.030)
pH: 5 (ref 5.0–8.0)

## 2019-05-16 LAB — CBC
HCT: 42.5 % (ref 39.0–52.0)
Hemoglobin: 14.5 g/dL (ref 13.0–17.0)
MCH: 33 pg (ref 26.0–34.0)
MCHC: 34.1 g/dL (ref 30.0–36.0)
MCV: 96.6 fL (ref 80.0–100.0)
Platelets: 192 10*3/uL (ref 150–400)
RBC: 4.4 MIL/uL (ref 4.22–5.81)
RDW: 11.9 % (ref 11.5–15.5)
WBC: 6.6 10*3/uL (ref 4.0–10.5)
nRBC: 0 % (ref 0.0–0.2)

## 2019-05-16 LAB — LIPASE, BLOOD: Lipase: 52 U/L — ABNORMAL HIGH (ref 11–51)

## 2019-05-16 MED ORDER — SODIUM CHLORIDE 0.9% FLUSH
3.0000 mL | Freq: Once | INTRAVENOUS | Status: AC
  Administered 2019-05-16: 3 mL via INTRAVENOUS

## 2019-05-16 MED ORDER — GADOBUTROL 1 MMOL/ML IV SOLN
9.0000 mL | Freq: Once | INTRAVENOUS | Status: AC | PRN
  Administered 2019-05-16: 9 mL via INTRAVENOUS

## 2019-05-16 MED ORDER — MORPHINE SULFATE (PF) 4 MG/ML IV SOLN
4.0000 mg | Freq: Once | INTRAVENOUS | Status: AC
  Administered 2019-05-16: 4 mg via INTRAVENOUS
  Filled 2019-05-16: qty 1

## 2019-05-16 MED ORDER — LORAZEPAM 2 MG/ML IJ SOLN: 1.0000 mg | Freq: Once | INTRAMUSCULAR | Status: DC

## 2019-05-16 MED ORDER — SODIUM CHLORIDE 0.9 % IV BOLUS
1000.0000 mL | Freq: Once | INTRAVENOUS | Status: AC
  Administered 2019-05-16: 1000 mL via INTRAVENOUS

## 2019-05-16 MED ORDER — ONDANSETRON 4 MG PO TBDP
4.0000 mg | ORAL_TABLET | Freq: Once | ORAL | Status: AC | PRN
  Administered 2019-05-16: 4 mg via ORAL
  Filled 2019-05-16: qty 1

## 2019-05-16 NOTE — ED Notes (Signed)
Pt said if he goes back to MRI, that he will need pain medication because lying on the table is painful.

## 2019-05-16 NOTE — ED Notes (Signed)
Patient transported to MRI 

## 2019-05-16 NOTE — ED Triage Notes (Signed)
Pt POV from home.   Pt reports n/v x1.5 week.  Pt reports diarrhea this AM.  Pt also c/o poor PO intake for 1.5 weeks. Hx of stage 4 small cell carcinoma.  Pt recently finished 10th round of radiation Tuesday.  Pt c/o upper thoracic back pain, stating "theres a tumor there pushing on nerves."  Pt was supposed to have MRI Wednesday, machine was nonoperational at that time. Pt also c/o left sided numbness ongoing since radiation first started, oncologist aware.

## 2019-05-16 NOTE — ED Provider Notes (Signed)
CA w/mets to spine and brain Radiation therapy C/o radicular left arm and left chest MRI pending  Plan: per MRI read  MRI  1. 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 Before any treatment suggest enhanced cervical MRI after symptom control. 2. Widespread osseous metastatic disease with progression from March 2020. 3. Thoracic progression from chest CT June 2020.  Per MRI tech the patient will not be able to receive another contrasted study for 24 hours.   Discussion with the patient: He reports left sided numbness to extremities and torso w/o bowel or bladder dysfunction. This numbness is no worse today. He came to ED with worsening right paraspinal pain uncontrolled with medications at home. He reports he feels dehydrated, unable to tolerate PO's for weeks.   Discussed implications of the MRI findings tonight. Will discuss findings with neurosurgery with regard to further studies, and coordination of care.   Talked to Margo Aye NP with neurosurgery, who consulted with Dr. Saintclair Halsted. Enhanced MRI cervical spine is appropriate study, no others recommended. They will provide consultation tomorrow.   Will admit to hospitalist tonight in anticipation of needed study tomorrow, pain control and hydration. Patient's pain is controlled currently. Discussed admission with Dr. Renaee Munda who accepts the patient on to his service.     Charlann Lange, PA-C 05/17/19 0012    Lucrezia Starch, MD 05/17/19 1215

## 2019-05-16 NOTE — ED Provider Notes (Signed)
[place] DEPT Provider Note   CSN: [id number] Arrival date & time: 05/16/19  4259     History   Chief Complaint Chief Complaint  Patient presents with  . Back Pain  . Weakness  . Emesis    HPI Cody Montoya is a 63 y.o. male.  History of lung cancer, metastasis to brain, thoracic spine.  Presents to the ER with worsening upper back pain, rating down left arm, numbness left axilla, left chest wall.  States pain up to 10-10 in severity, sharp, shooting.  Has been taking his MS Contin and oxycodone at home with minimal relief.  No associated fevers, neck pain, neck stiffness, bladder or bowel incontinence, saddle anesthesia, lower back pain.     HPI  Past Medical History:  Diagnosis Date  . Cancer [facility])     Patient Active Problem List   Diagnosis Date Noted  . Brain metastasis ([place]) 05/14/2019  . 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 Counseling regarding advance care planning and goals of care 12/15/2018  . Metastatic cancer ([place])   . Bone metastases ([place])   . Liver metastases ([place])   . Hepatocellular carcinoma ([place]) 11/29/2018  . History of alcohol abuse 11/29/2018  . Hypokalemia 11/29/2018    Past Surgical History:  Procedure Laterality Date  . NO PAST SURGERIES          Home Medications    Prior to Admission medications   Medication Sig Start Date End Date Taking? Authorizing Provider  docusate sodium (COLACE) 100 MG capsule Take 1 capsule (100 mg total) by mouth 2 (two) times daily. 12/02/18   Spongberg, Audie Pinto, MD  ibuprofen (ADVIL,MOTRIN) 200 MG tablet Take 800 mg by mouth every 6 (six) hours as needed for fever or moderate pain.    [provider]  LORazepam (ATIVAN) 0.5 MG tablet 1 tab po q 4-6 hours prn or 1 tab po 30 minutes prior to MRI or radiation 05/12/19   Kyung Rudd, MD  memantine (NAMENDA) 5 MG tablet Start on first day of XRT: Week 1: take 1 tablet po q am  Week 2: take 1 tablet po q am and 1 tablet po q pm Week 3: take 2 tablets po q am, and 1 tablets po q pm Week 4-24: take 2 tablets po q am, and 2 tablets po q pm.  Stop after 24 weeks 10/09/2019 should be last day 04/02/19   Hayden Pedro, PA-C  ondansetron (ZOFRAN) 8 MG tablet Take 1 tablet (8 mg total) by mouth 2 (two) times daily as needed for refractory nausea / vomiting. Start on day 3 after carboplatin chemo. 12/15/18   Brunetta Genera, MD  oxyCODONE (OXY IR/ROXICODONE) 5 MG immediate release tablet Take 1-2 tablets (5-10 mg total) by mouth every 6 (six) hours as needed for moderate pain. 05/13/19   Brunetta Genera, MD  oxyCODONE (OXYCONTIN) 10 mg 12 hr tablet Take 1 tablet (10 mg total) by mouth every 12 (twelve) hours. 04/20/19   Brunetta Genera, MD  polyethylene glycol [facility] Floria Raveling) packet Take 17 g by mouth daily as needed for mild constipation. 12/02/18   Spongberg, Audie Pinto, MD    Family History Family History  Problem Relation Age of Onset  . Breast cancer Sister   . Breast cancer Sister     Social History Social History   Tobacco Use  . Smoking status: Current Every Day Smoker    Packs/day:  1.00    Years: 45.00    Pack years: 45.00    Types: Cigarettes  . Smokeless tobacco: Never Used  Substance Use Topics  . Alcohol use: Not Currently    Alcohol/week: 8.0 standard drinks    Types: 3 Cans of beer, 5 Shots of liquor per week    Comment: no alcohol in past month due to symptoms  . Drug use: Not Currently    Frequency: 7.0 times per week    Types: Marijuana     Allergies   Patient has no known allergies.   Review of Systems Review of Systems  Constitutional: Negative for chills and fever.  HENT: Negative for ear pain and sore throat.   Eyes: Negative for pain and visual disturbance.  Respiratory: Negative for cough and shortness of breath.   Cardiovascular: Negative for chest pain and palpitations.  Gastrointestinal: Negative for  abdominal pain and vomiting.  Genitourinary: Negative for dysuria and hematuria.  Musculoskeletal: Positive for back pain. Negative for arthralgias.  Skin: Negative for color change and rash.  Neurological: Positive for weakness. Negative for seizures, syncope and headaches.  All other systems reviewed and are negative.    Physical Exam Updated Vital Signs BP (!) 169/92 (BP Location: Right Arm)   Pulse 79   Temp 98.2 F (36.8 C) (Oral)   Resp 18   Ht 5\' 10"  (1.778 m)   Wt 79.4 kg   SpO2 98%   BMI 25.11 kg/m   Physical Exam Vitals signs and nursing note reviewed.  Constitutional:      Appearance: He is well-developed.  HENT:     Head: Normocephalic and atraumatic.  Eyes:     Conjunctiva/sclera: Conjunctivae normal.  Neck:     Musculoskeletal: Neck supple.     Comments: Return to palpation mid line C-spine Cardiovascular:     Rate and Rhythm: Normal rate and regular rhythm.     Heart sounds: No murmur.  Pulmonary:     Effort: Pulmonary effort is normal. No respiratory distress.     Breath sounds: Normal breath sounds.  Abdominal:     Palpations: Abdomen is soft.     Tenderness: There is no abdominal tenderness.  Musculoskeletal:     Comments: Tenderness palpation throughout T-spine, no L-spine tenderness, no deformity noted  Skin:    General: Skin is warm and dry.  Neurological:     Mental Status: He is alert.     Comments: 5 out of 5 strength throughout bilateral upper and lower extremities, sensation to light touch intact in bilateral upper and lower extremities      ED Treatments / Results  Labs (all labs ordered are listed, but only abnormal results are displayed) Labs Reviewed  LIPASE, BLOOD - Abnormal; Notable for the following components:      Result Value   Lipase 52 (*)    All other components within normal limits  COMPREHENSIVE METABOLIC PANEL - Abnormal; Notable for the following components:   Sodium 128 (*)    Chloride 93 (*)    Calcium 8.7  (*)    Total Protein 8.6 (*)    AST 49 (*)    Total Bilirubin 1.3 (*)    All other components within normal limits  URINALYSIS, ROUTINE W REFLEX MICROSCOPIC - Abnormal; Notable for the following components:   Color, Urine AMBER (*)    Ketones, ur 20 (*)    All other components within normal limits  CBC    EKG None  Radiology No  results found.  Procedures Procedures (including critical care time)  Medications Ordered in ED Medications  LORazepam (ATIVAN) injection 1 mg (has no administration in time range)  sodium chloride flush (NS) 0.9 % injection 3 mL (3 mLs Intravenous Given 05/16/19 2057)  ondansetron (ZOFRAN-ODT) disintegrating tablet 4 mg (4 mg Oral Given 05/16/19 2056)  morphine 4 MG/ML injection 4 mg (4 mg Intravenous Given 05/16/19 2056)  gadobutrol (GADAVIST) 1 MMOL/ML injection 9 mL (9 mLs Intravenous Contrast Given 05/16/19 2209)     Initial Impression / Assessment and Plan / ED Course  I have reviewed the triage vital signs and the nursing notes.  Pertinent labs & imaging results that were available during my care of the patient were reviewed by me and considered in my medical decision making (see chart for details).  Clinical Course as of May 16 2235  Sat May 16, 2019  2210 Signed out to Bryson - will follow up on MRI   [RD]    Clinical Course User Index [RD] Lucrezia Starch, MD       63 year old gentleman with history of lung cancer with mets to spine, brain presents to ER with thoracic spine pain, radicular left arm pain, numbness to her axilla, upper chest.  On exam did not find focal deficit.  However given patient's reported symptoms and progression of symptoms, known metastases to thoracic spine, believe patient would benefit from MRI for further evaluation.  MRI had been ordered but was unable to be completed as outpatient.  While awaiting MRI results, signed out to physician assistant Judeen Hammans.  Please refer to her note for further plan of  disposition after MRI results and patient is reassessed.  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

## 2019-05-16 NOTE — ED Notes (Signed)
Around 6:45 PM pt took oxycodone 10 MG ER

## 2019-05-17 ENCOUNTER — Observation Stay (HOSPITAL_COMMUNITY): Payer: Medicaid Other

## 2019-05-17 ENCOUNTER — Encounter (HOSPITAL_COMMUNITY): Payer: Self-pay

## 2019-05-17 DIAGNOSIS — C3490 Malignant neoplasm of unspecified part of unspecified bronchus or lung: Secondary | ICD-10-CM | POA: Diagnosis present

## 2019-05-17 DIAGNOSIS — K5900 Constipation, unspecified: Secondary | ICD-10-CM | POA: Diagnosis present

## 2019-05-17 DIAGNOSIS — Z803 Family history of malignant neoplasm of breast: Secondary | ICD-10-CM | POA: Diagnosis not present

## 2019-05-17 DIAGNOSIS — Z79899 Other long term (current) drug therapy: Secondary | ICD-10-CM | POA: Diagnosis not present

## 2019-05-17 DIAGNOSIS — Z7189 Other specified counseling: Secondary | ICD-10-CM

## 2019-05-17 DIAGNOSIS — C787 Secondary malignant neoplasm of liver and intrahepatic bile duct: Secondary | ICD-10-CM | POA: Diagnosis present

## 2019-05-17 DIAGNOSIS — F17210 Nicotine dependence, cigarettes, uncomplicated: Secondary | ICD-10-CM | POA: Diagnosis present

## 2019-05-17 DIAGNOSIS — Z20828 Contact with and (suspected) exposure to other viral communicable diseases: Secondary | ICD-10-CM | POA: Diagnosis present

## 2019-05-17 DIAGNOSIS — E871 Hypo-osmolality and hyponatremia: Secondary | ICD-10-CM

## 2019-05-17 DIAGNOSIS — M4802 Spinal stenosis, cervical region: Secondary | ICD-10-CM | POA: Diagnosis present

## 2019-05-17 DIAGNOSIS — Z923 Personal history of irradiation: Secondary | ICD-10-CM | POA: Diagnosis not present

## 2019-05-17 DIAGNOSIS — C7951 Secondary malignant neoplasm of bone: Secondary | ICD-10-CM | POA: Diagnosis present

## 2019-05-17 DIAGNOSIS — C7931 Secondary malignant neoplasm of brain: Secondary | ICD-10-CM | POA: Diagnosis present

## 2019-05-17 DIAGNOSIS — G893 Neoplasm related pain (acute) (chronic): Secondary | ICD-10-CM | POA: Diagnosis present

## 2019-05-17 DIAGNOSIS — Z9221 Personal history of antineoplastic chemotherapy: Secondary | ICD-10-CM | POA: Diagnosis not present

## 2019-05-17 DIAGNOSIS — G9589 Other specified diseases of spinal cord: Secondary | ICD-10-CM

## 2019-05-17 DIAGNOSIS — Z791 Long term (current) use of non-steroidal anti-inflammatories (NSAID): Secondary | ICD-10-CM | POA: Diagnosis not present

## 2019-05-17 DIAGNOSIS — R937 Abnormal findings on diagnostic imaging of other parts of musculoskeletal system: Secondary | ICD-10-CM | POA: Diagnosis not present

## 2019-05-17 DIAGNOSIS — M5114 Intervertebral disc disorders with radiculopathy, thoracic region: Secondary | ICD-10-CM | POA: Diagnosis present

## 2019-05-17 DIAGNOSIS — Z79891 Long term (current) use of opiate analgesic: Secondary | ICD-10-CM | POA: Diagnosis not present

## 2019-05-17 DIAGNOSIS — E860 Dehydration: Secondary | ICD-10-CM | POA: Diagnosis present

## 2019-05-17 DIAGNOSIS — K7460 Unspecified cirrhosis of liver: Secondary | ICD-10-CM | POA: Diagnosis present

## 2019-05-17 LAB — BASIC METABOLIC PANEL
Anion gap: 12 (ref 5–15)
BUN: 14 mg/dL (ref 8–23)
CO2: 23 mmol/L (ref 22–32)
Calcium: 8.7 mg/dL — ABNORMAL LOW (ref 8.9–10.3)
Chloride: 99 mmol/L (ref 98–111)
Creatinine, Ser: 0.57 mg/dL — ABNORMAL LOW (ref 0.61–1.24)
GFR calc Af Amer: >60 mL/min (ref >60)
GFR calc non Af Amer: >60 mL/min (ref >60)
Glucose, Bld: 104 mg/dL — ABNORMAL HIGH (ref 70–99)
Potassium: 4.1 mmol/L (ref 3.5–5.1)
Sodium: 134 mmol/L — ABNORMAL LOW (ref 135–145)

## 2019-05-17 LAB — SARS CORONAVIRUS 2 (TAT 6-24 HRS): SARS Coronavirus 2: NEGATIVE

## 2019-05-17 MED ORDER — ONDANSETRON HCL 4 MG/2ML IJ SOLN
4.0000 mg | Freq: Three times a day (TID) | INTRAMUSCULAR | Status: AC
  Administered 2019-05-17 – 2019-05-19 (×6): 4 mg via INTRAVENOUS
  Filled 2019-05-17 (×6): qty 2

## 2019-05-17 MED ORDER — OXYCODONE HCL ER 10 MG PO T12A
10.0000 mg | EXTENDED_RELEASE_TABLET | Freq: Two times a day (BID) | ORAL | Status: DC
  Administered 2019-05-17 – 2019-05-19 (×5): 10 mg via ORAL
  Filled 2019-05-17 (×5): qty 1

## 2019-05-17 MED ORDER — ENOXAPARIN SODIUM 40 MG/0.4ML ~~LOC~~ SOLN
40.0000 mg | Freq: Every day | SUBCUTANEOUS | Status: DC
  Administered 2019-05-17 – 2019-05-24 (×7): 40 mg via SUBCUTANEOUS
  Filled 2019-05-17 (×8): qty 0.4

## 2019-05-17 MED ORDER — DOCUSATE SODIUM 100 MG PO CAPS
100.0000 mg | ORAL_CAPSULE | Freq: Two times a day (BID) | ORAL | Status: DC
  Filled 2019-05-17: qty 1

## 2019-05-17 MED ORDER — SENNA 8.6 MG PO TABS
1.0000 | ORAL_TABLET | Freq: Two times a day (BID) | ORAL | Status: DC
  Filled 2019-05-17: qty 1

## 2019-05-17 MED ORDER — MORPHINE SULFATE (PF) 4 MG/ML IV SOLN
4.0000 mg | Freq: Once | INTRAVENOUS | Status: AC
  Administered 2019-05-17: 4 mg via INTRAVENOUS
  Filled 2019-05-17: qty 1

## 2019-05-17 MED ORDER — SENNOSIDES-DOCUSATE SODIUM 8.6-50 MG PO TABS
1.0000 | ORAL_TABLET | Freq: Two times a day (BID) | ORAL | Status: DC
  Administered 2019-05-17 – 2019-05-23 (×11): 1 via ORAL
  Filled 2019-05-17 (×11): qty 1

## 2019-05-17 MED ORDER — MORPHINE SULFATE (PF) 2 MG/ML IV SOLN
2.0000 mg | INTRAVENOUS | Status: DC | PRN
  Administered 2019-05-17 – 2019-05-19 (×15): 4 mg via INTRAVENOUS
  Administered 2019-05-19: 3 mg via INTRAVENOUS
  Administered 2019-05-20 (×4): 4 mg via INTRAVENOUS
  Administered 2019-05-20 (×2): 2 mg via INTRAVENOUS
  Administered 2019-05-20 – 2019-05-23 (×20): 4 mg via INTRAVENOUS
  Filled 2019-05-17 (×20): qty 2
  Filled 2019-05-17: qty 1
  Filled 2019-05-17 (×4): qty 2
  Filled 2019-05-17: qty 1
  Filled 2019-05-17 (×16): qty 2

## 2019-05-17 MED ORDER — SODIUM CHLORIDE 0.9 % IV SOLN
INTRAVENOUS | Status: DC
  Administered 2019-05-17 – 2019-05-24 (×8): via INTRAVENOUS

## 2019-05-17 MED ORDER — MORPHINE SULFATE (PF) 4 MG/ML IV SOLN
4.0000 mg | Freq: Once | INTRAVENOUS | Status: AC
  Administered 2019-05-17: 07:00:00 4 mg via INTRAVENOUS
  Filled 2019-05-17: qty 1

## 2019-05-17 MED ORDER — ONDANSETRON HCL 4 MG/2ML IJ SOLN
4.0000 mg | Freq: Three times a day (TID) | INTRAMUSCULAR | Status: DC | PRN
  Administered 2019-05-17: 4 mg via INTRAVENOUS
  Filled 2019-05-17: qty 2

## 2019-05-17 MED ORDER — OXYCODONE HCL 5 MG PO TABS
5.0000 mg | ORAL_TABLET | ORAL | Status: DC | PRN
  Administered 2019-05-17 – 2019-05-22 (×5): 10 mg via ORAL
  Filled 2019-05-17 (×5): qty 2
  Filled 2019-05-17: qty 1
  Filled 2019-05-17 (×2): qty 2

## 2019-05-17 MED ORDER — HYDROMORPHONE HCL 1 MG/ML IJ SOLN
1.0000 mg | INTRAMUSCULAR | Status: DC | PRN
  Administered 2019-05-17: 1 mg via INTRAVENOUS
  Filled 2019-05-17: qty 1

## 2019-05-17 MED ORDER — POLYETHYLENE GLYCOL 3350 17 G PO PACK
17.0000 g | PACK | Freq: Every day | ORAL | Status: DC | PRN
  Administered 2019-05-21: 20:00:00 17 g via ORAL
  Filled 2019-05-17 (×2): qty 1

## 2019-05-17 MED ORDER — DEXAMETHASONE SODIUM PHOSPHATE 10 MG/ML IJ SOLN
8.0000 mg | Freq: Two times a day (BID) | INTRAMUSCULAR | Status: DC
  Administered 2019-05-17 – 2019-05-24 (×13): 8 mg via INTRAVENOUS
  Filled 2019-05-17: qty 1
  Filled 2019-05-17: qty 0.8
  Filled 2019-05-17 (×4): qty 1
  Filled 2019-05-17: qty 0.8
  Filled 2019-05-17: qty 1
  Filled 2019-05-17: qty 0.8
  Filled 2019-05-17: qty 1
  Filled 2019-05-17 (×3): qty 0.8

## 2019-05-17 MED ORDER — MORPHINE SULFATE (PF) 2 MG/ML IV SOLN
2.0000 mg | INTRAVENOUS | Status: DC | PRN
  Administered 2019-05-17 (×4): 2 mg via INTRAVENOUS
  Filled 2019-05-17 (×4): qty 1

## 2019-05-17 MED ORDER — GADOBUTROL 1 MMOL/ML IV SOLN
9.0000 mL | Freq: Once | INTRAVENOUS | Status: AC | PRN
  Administered 2019-05-17: 9 mL via INTRAVENOUS

## 2019-05-17 MED ORDER — ONDANSETRON HCL 4 MG/2ML IJ SOLN
4.0000 mg | Freq: Four times a day (QID) | INTRAMUSCULAR | Status: DC | PRN
  Administered 2019-05-17 – 2019-05-23 (×3): 4 mg via INTRAVENOUS
  Filled 2019-05-17 (×3): qty 2

## 2019-05-17 MED ORDER — TRAZODONE HCL 50 MG PO TABS
50.0000 mg | ORAL_TABLET | Freq: Every evening | ORAL | Status: DC | PRN
  Filled 2019-05-17: qty 1

## 2019-05-17 NOTE — Progress Notes (Signed)
Pt being taken down to MRI at this time. Alert and oriented. MD aware.

## 2019-05-17 NOTE — Consult Note (Signed)
Reason for Consult: Spine mets Referring Physician: [facility] hospitalist  Cody Montoya is an 63 y.o. male.  HPI: 63 year old gentleman with history of metastatic cancer known brain mets known spine mets known mets to to his lung liver abdomen primary may be hepatocellular.  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  Past Medical History:  Diagnosis Date  . Cancer [facility])     Past Surgical History:  Procedure Laterality Date  . NO PAST SURGERIES      Family History  Problem Relation Age of Onset  . Breast cancer Sister   . Breast cancer Sister     Social History:  reports that he has been smoking cigarettes. He has a 45.00 pack-year smoking history. He has never used smokeless tobacco. He reports previous alcohol use of about 8.0 standard drinks of alcohol per week. He reports previous drug use. Frequency: 7.00 times per week. Drug: Marijuana.  Allergies: No Known Allergies  Medications: I have reviewed the patient's current medications.  Results for orders placed or performed during the hospital encounter of 05/16/19 (from the past 48 hour(s))  Lipase, blood     Status: Abnormal   Collection Time: 05/16/19  7:10 PM  Result Value Ref Range   Lipase 52 (H) 11 - 51 U/L    Comment: Performed at [facility], [place] [address] [place], [place] [address]  Comprehensive metabolic panel     Status: Abnormal   Collection Time: 05/16/19  7:10 PM  Result Value Ref Range   Sodium 128 (L) 135 - 145 mmol/L   Potassium 3.9 3.5 - 5.1 mmol/L   Chloride 93 (L) 98 - 111 mmol/L   CO2 24 22 - 32 mmol/L   Glucose, Bld 93 70 - 99 mg/dL   BUN 16 8 - 23 mg/dL   Creatinine, Ser 0.65 0.61 - 1.24 mg/dL   Calcium 8.7 (L) 8.9 - 10.3 mg/dL   Total Protein 8.6 (H) 6.5 - 8.1 g/dL   Albumin 4.5 3.5 - 5.0 g/dL   AST 49 (H) 15 - 41 U/L   ALT  35 0 - 44 U/L   Alkaline Phosphatase 96 38 - 126 U/L   Total Bilirubin 1.3 (H) 0.3 - 1.2 mg/dL   GFR calc non Af Amer >60 >60 mL/min   GFR calc Af Amer >60 >60 mL/min   Anion gap 11 5 - 15    Comment: Performed at [facility], [place] [address], [place], [place] [address]  CBC     Status: None   Collection Time: 05/16/19  7:10 PM  Result Value Ref Range   WBC 6.6 4.0 - 10.5 K/uL   RBC 4.40 4.22 - 5.81 MIL/uL   Hemoglobin 14.5 13.0 - 17.0 g/dL   HCT 42.5 39.0 - 52.0 %   MCV 96.6 80.0 - 100.0 fL   MCH 33.0 26.0 - 34.0 pg   MCHC 34.1 30.0 - 36.0 g/dL   RDW 11.9 11.5 - 15.5 %   Platelets 192 150 - 400 K/uL   nRBC 0.0 0.0 - 0.2 %    Comment: Performed at [facility], [place] [address] [place], [place] [address]  Urinalysis, Routine w reflex microscopic     Status: Abnormal   Collection Time: 05/16/19  8:59 PM  Result Value Ref Range   Color, Urine AMBER (A) YELLOW  Comment: BIOCHEMICALS MAY BE AFFECTED BY COLOR   APPearance CLEAR CLEAR   Specific Gravity, Urine 1.020 1.005 - 1.030   pH 5.0 5.0 - 8.0   Glucose, UA NEGATIVE NEGATIVE mg/dL   Hgb urine dipstick NEGATIVE NEGATIVE   Bilirubin Urine NEGATIVE NEGATIVE   Ketones, ur 20 (A) NEGATIVE mg/dL   Protein, ur NEGATIVE NEGATIVE mg/dL   Nitrite NEGATIVE NEGATIVE   Leukocytes,Ua NEGATIVE NEGATIVE    Comment: Performed at [facility], [place] [address], [place], [place] [address]    Mr Thoracic Spine W Wo Contrast  Result Date: 05/16/2019 CLINICAL DATA:  Worsening radicular pain in the left arm. EXAM: MRI THORACIC WITHOUT AND WITH CONTRAST TECHNIQUE: Multiplanar and multiecho pulse sequences of the thoracic spine were obtained without and with intravenous contrast. CONTRAST:  10 cc Gadavist intravenous COMPARISON:  12/01/2018 FINDINGS: MRI THORACIC SPINE FINDINGS Alignment:  No abnormal alignment. Vertebrae: Osseous metastatic disease which is essentially confluent in the C6, C7,  T1, and T2 vertebrae, progressive from prior. Metastases also seen in the T3 spinous process and T4 through T11 vertebral bodies, most extensive in the T9 vertebral body. Cord: 11 mm mass in the spinal canal at the level of C7-T1. This appears intradural on axial postcontrast imaging but there is very limited assessment due to motion artifact. Cord edema is seen extending inferiorly. No contiguous mass is seen in the epidural space at this level. Paraspinal and other soft tissues: Left perihilar mass with mediastinal adenopathy. This is a significant change when compared to chest CT June 2020 Disc levels: Spondylosis and lower thoracic disc degeneration. No significant degenerative findings. IMPRESSION: 1. 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 Before any treatment suggest enhanced cervical MRI after symptom control. 2. Widespread osseous metastatic disease with progression from March 2020. 3. Thoracic progression from chest CT June 2020. Electronically Signed   By: Monte Fantasia M.D.   On: 05/16/2019 22:37    Review of Systems  Musculoskeletal: Positive for joint pain and neck pain.  Neurological: Positive for sensory change and focal weakness.   Blood pressure 136/78, pulse 64, temperature 98 F (36.7 C), temperature source Oral, resp. rate 17, height 5' 10"  (1.778 m), weight 79.4 kg, SpO2 96 %. Physical Exam  Constitutional: He is oriented to person, place, and time. He appears well-developed.  HENT:  Head: Normocephalic.  Eyes: Pupils are equal, round, and reactive to light.  Neck: Normal range of motion.  GI: Soft.  Neurological: He is alert and oriented to person, place, and time. GCS eye subscore is 4. GCS verbal subscore is 5. GCS motor subscore is 6.  Patient is awake strength appears to be 5 out of 5 upper extremities deltoid, bicep, wrist flexion, wrist extension,  hand intrinsics he has maybe a little bit of right tricep weakness at about 4 to 4+ out of 5 left tricep appears to be 5 out of 5 lower extremities appear to be a little bit weak 4+ out of 5 proximally iliopsoas quads 5 out of 5 distally dorsiflexion plantar flexion reflexes are decreased bilaterally  Skin: Skin is warm and dry.    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  More than likely this will not be a surgical lesion will recommend radiation or palliative treatment.  Janard Culp P 05/17/2019, 9:20 AM

## 2019-05-17 NOTE — Progress Notes (Signed)
Pt continues to c/o pain 7/10 unrelieved by PRN, see MAR. Additional 1X dose ordered at [place], see MAR. MD paged again with request for further pain management.

## 2019-05-17 NOTE — Progress Notes (Signed)
Writer has told MD (present on floor) that pt did not do well in MRI and was unable to get a good image. Per MRI tech, pt will need to go to [facility] and be sedated in order to get good images on MRI. Unable to to MRI today due to contrast. It will be tomorrow afternoon before pt can get another MRI.

## 2019-05-17 NOTE — Progress Notes (Signed)
Writer arrived to find pt has order for STAT MRI of spine. Writer called MRI tech, Jinny Blossom, and made her aware. Jinny Blossom stated she contrasted the pt last night and that the night doctor was aware she has to wait at least 24 hours to do the MRI. She is coming in today and will contact Radiology to check status of the time she may be able to do the MRI on this pt.

## 2019-05-17 NOTE — Consult Note (Signed)
Consultation Note Date: 05/17/2019   Patient Name: Cody Montoya [facility]  DOB: July 13, 1956  MRN: [id number]  Age / Sex: 63 y.o., male  PCP: Default, Provider, MD Referring Physician: Florencia Reasons, MD  Reason for Consultation: Pain control  HPI/Patient Profile: 63 y.o. 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   Clinical Assessment and Goals of Care: Patient admitted with intractable pain.  Imaging shows possible C1 or T1 spinal canal mass, neurosurgery is following.  Palliative consult for pain management and supportive care has been requested.  Medication history noted.  Patient has required almost 20 mg of IV morphine in the last 24 hours, all he is also on p.o. OxyContin and p.o. oxycodone immediate release.  Patient has just returned from MRI.  He states he was not able to lay still.  He is having a lot of pain in the right upper scapular area.  He has had 1 bout of vomiting.  He was just given some IV Zofran.  He complains of uncontrolled pain.  Palliative medicine is specialized medical care for people living with serious illness. It focuses on providing relief from the symptoms and stress of a serious illness. The goal is to improve quality of life for both the patient and the family.  Goals of care: Broad aims of medical therapy in relation to the patient's values and preferences. Our aim is to provide medical care aimed at enabling patients to achieve the goals that matter most to them, given the circumstances of their particular medical situation and their constraints.   Please note additional discussion/recommendations as listed below.  Discussed with patient.  Bedside RN also present.  NEXT OF KIN Katharine Look is  listed as next of kin.  Patient has 2 sons.  SUMMARY OF RECOMMENDATIONS   Change from IV morphine to IV Dilaudid as needed for pain.  Patient has scheduled OxyContin as well as oxycodone p.o. as needed to be used as well.  Continue antiemetic and bowel regimen.  Continue to monitor.  Further imaging pending.  Palliative medicine team to follow hospital course, overall disease trajectory to help guide further goals of care discussions and appropriate disposition planning.  Remains full code full scope for now.  Code Status/Advance Care Planning:  Full code    Symptom Management:   As above  Palliative Prophylaxis:   Bowel Regimen  Additional Recommendations (Limitations, Scope, Preferences):  Full Scope Treatment  Psycho-social/Spiritual:   Desire for further Chaplaincy support:yes  Additional Recommendations: Caregiving  Support/Resources  Prognosis:   Unable to determine  Discharge Planning: To Be Determined      Primary Diagnoses: Present on Admission: . Hepatocellular carcinoma ([place]) . History of alcohol abuse . Metastatic cancer ([place]) . Bone metastases ([place]) . Liver metastases ([place]) . Brain metastasis ([place])   I have reviewed the medical record, interviewed the patient and family, and examined the patient.  The following aspects are pertinent.  Past Medical History:  Diagnosis Date  . Cancer [facility])    Social History   Socioeconomic History  . Marital status: Single    Spouse name: Not on file  . Number of children: Not on file  . Years of education: Not on file  . Highest education level: Not on file  Occupational History  . Occupation: Surveyor, quantity  Social Needs  . Financial resource strain: Not very hard  . Food insecurity    Worry: Never true    Inability: Never true  . Transportation needs    Medical: No    Non-medical: No  Tobacco Use  . Smoking status: Current Every Day Smoker    Packs/day: 1.00    Years: 45.00    Pack years:  45.00    Types: Cigarettes  . Smokeless tobacco: Never Used  Substance and Sexual Activity  . Alcohol use: Not Currently    Alcohol/week: 8.0 standard drinks    Types: 3 Cans of beer, 5 Shots of liquor per week    Comment: no alcohol in past month due to symptoms  . Drug use: Not Currently    Frequency: 7.0 times per week    Types: Marijuana  . Sexual activity: Not Currently  Lifestyle  . Physical activity    Days per week: 5 days    Minutes per session: 150+ min  . Stress: To some extent  Relationships  . Social connections    Talks on phone: More than three times a week    Gets together: More than three times a week    Attends religious service: Never    Active member of club or organization: No    Attends meetings of clubs or organizations: Never    Relationship status: Living with partner  Other Topics Concern  . Not on file  Social History Narrative  . Not on file   Family History  Problem Relation Age of Onset  . Breast cancer Sister   . Breast cancer Sister    Scheduled Meds: . enoxaparin (LOVENOX) injection  40 mg Subcutaneous Daily  . oxyCODONE  10 mg Oral Q12H  . senna-docusate  1 tablet Oral BID   Continuous Infusions: . sodium chloride 50 mL/hr at 05/17/19 0144   PRN Meds:.HYDROmorphone (DILAUDID) injection, ondansetron (ZOFRAN) IV, oxyCODONE, polyethylene glycol, traZODone Medications Prior to Admission:  Prior to Admission medications   Medication Sig Start Date End Date Taking? Authorizing Provider  acetaminophen (TYLENOL) 500 MG tablet Take 1,500 mg by mouth every 6 (six) hours as needed for moderate pain.   Yes [provider]  B Complex Vitamins SOLN Take 1 mL by mouth daily.   Yes [provider]  Calcium 600-200 MG-UNIT tablet Take 1 tablet by mouth daily.   Yes [provider]  Cholecalciferol (VITAMIN D) 50 MCG (2000 UT) CAPS Take 2,000 Units by mouth daily.   Yes [provider]  dimenhyDRINATE (DRAMAMINE)  50 MG tablet Take 50 mg by mouth every 8 (eight) hours as needed for dizziness.   Yes [provider]  docusate sodium (COLACE) 100 MG capsule Take 1 capsule (100 mg total) by mouth 2 (two) times daily. Patient taking differently: Take 200 mg by mouth daily.  12/02/18  Yes Spongberg, Audie Pinto, MD  ibuprofen (ADVIL,MOTRIN) 200 MG tablet Take 600 mg by mouth every 6 (six) hours as needed for fever or moderate pain.    Yes [provider]  LORazepam (ATIVAN)  0.5 MG tablet 1 tab po q 4-6 hours prn or 1 tab po 30 minutes prior to MRI or radiation Patient taking differently: Take 0.5 mg by mouth every 6 (six) hours as needed for anxiety or sedation.  05/12/19  Yes Kyung Rudd, MD  Magnesium Salicylate Tetrahyd (DOANS EXTRA STRENGTH) 580 MG TABS Take 1,160 mg by mouth every 6 (six) hours as needed (pain).   Yes [provider]  Menthol (BIOFREEZE) 10.5 % AERO Apply 1 application topically every 4 (four) hours as needed (pain).   Yes [provider]  Multiple Vitamin (MULTIVITAMIN WITH MINERALS) TABS tablet Take 1 tablet by mouth daily.   Yes [provider]  naproxen sodium (ALEVE) 220 MG tablet Take 440 mg by mouth 2 (two) times daily as needed (pain).   Yes [provider]  oxyCODONE (OXY IR/ROXICODONE) 5 MG immediate release tablet Take 1-2 tablets (5-10 mg total) by mouth every 6 (six) hours as needed for moderate pain. Patient taking differently: Take 5-10 mg by mouth every 4 (four) hours as needed for moderate pain.  05/13/19  Yes Brunetta Genera, MD  oxyCODONE (OXYCONTIN) 10 mg 12 hr tablet Take 1 tablet (10 mg total) by mouth every 12 (twelve) hours. 04/20/19  Yes Brunetta Genera, MD  polyethylene glycol [facility] / Floria Raveling) packet Take 17 g by mouth daily as needed for mild constipation. Patient taking differently: Take 17 g by mouth daily.  12/02/18  Yes Spongberg, Audie Pinto, MD  memantine (NAMENDA) 5 MG tablet Start on first day  of XRT: Week 1: take 1 tablet po q am Week 2: take 1 tablet po q am and 1 tablet po q pm Week 3: take 2 tablets po q am, and 1 tablets po q pm Week 4-24: take 2 tablets po q am, and 2 tablets po q pm.  Stop after 24 weeks 10/09/2019 should be last day 04/02/19   Hayden Pedro, PA-C  ondansetron (ZOFRAN) 8 MG tablet Take 1 tablet (8 mg total) by mouth 2 (two) times daily as needed for refractory nausea / vomiting. Start on day 3 after carboplatin chemo. 12/15/18   Brunetta Genera, MD   No Known Allergies Review of Systems + Pain by the right scapular area upper back Physical Exam Awake alert resting in bed Complains of pain in upper back Also has some neck discomfort Lungs are clear to auscultation S1-S2 Abdomen is not distended Awake alert oriented No edema  Vital Signs: BP 138/74 (BP Location: Right Arm)   Pulse 60   Temp 97.7 F (36.5 C) (Oral)   Resp 16   Ht 5\' 10"  (1.778 m)   Wt 79.4 kg   SpO2 92%   BMI 25.11 kg/m  Pain Scale: 0-10   Pain Score: 6    SpO2: SpO2: 92 % O2 Device:SpO2: 92 % O2 Flow Rate: .   IO: Intake/output summary:   Intake/Output Summary (Last 24 hours) at 05/17/2019 1400 Last data filed at 05/17/2019 0344 Gross per 24 hour  Intake 1089.44 ml  Output -  Net 1089.44 ml    LBM:   Baseline Weight: Weight: 79.4 kg Most recent weight: Weight: 79.4 kg     Palliative Assessment/Data:   PPS 40%  Time In:  1300 Time Out:  1400 Time Total:  60 Greater than 50%  of this time was spent counseling and coordinating care related to the above assessment and plan.  Signed by: Loistine Chance, MD [phone]  Please contact  Palliative Medicine Team phone at [phone] for questions and concerns.  For individual provider: See Shea Evans

## 2019-05-17 NOTE — H&P (Addendum)
History and Physical  Cody Montoya TKZ:[id number] DOB: Jan 03, 1956 DOA: 05/16/2019   PCP: Default, Provider, MD   Patient coming from: Home   Chief Complaint: Back pain   HPI: Cody Montoya is a 63 y.o. male with medical history significant for metastatic HCC to bone, brain and liver who is being admitted due to intractable pain, vomiting and concern for spinal metastasis. He recently completed radiation for brain mets, and has chronic related left sided body numbness but has been having pain in his back for several weeks as well. The pain is a sharp stabbing ache between his shoulderblades and radiates to the sides. He has no associated numbness from this, no extremity weakness, saddle parasthesias or changes in bowel or bladder habits. He came to the ER since he was miserable from the back pain which has been worsening and when the pain gets bad he is unable to keep down anything by mouth.   ED Course: He had labs done which are unremarkable but a motion degraded MRI of the spine shows possible C7/T1 spinal canal mass. ER provider spoke with NSG who feels no intervention needed tonight but recommends admission for contrast MRI of the C spine in 24 hours, followed by their recommendations.  Review of Systems: Please see HPI for pertinent positives and negatives. A complete 10 system review of systems are otherwise negative.  Past Medical History:  Diagnosis Date  . Cancer [facility])    Past Surgical History:  Procedure Laterality Date  . NO PAST SURGERIES      Social History:  reports that he has been smoking cigarettes. He has a 45.00 pack-year smoking history. He has never used smokeless tobacco. He reports previous alcohol use of about 8.0 standard drinks of alcohol per week. He reports previous drug use. Frequency: 7.00 times per week. Drug: Marijuana.   No Known Allergies  Family History  Problem Relation Age of Onset  . Breast cancer Sister   . Breast cancer Sister       Prior to Admission medications   Medication Sig Start Date End Date Taking? Authorizing Provider  docusate sodium (COLACE) 100 MG capsule Take 1 capsule (100 mg total) by mouth 2 (two) times daily. 12/02/18   Spongberg, Audie Pinto, MD  ibuprofen (ADVIL,MOTRIN) 200 MG tablet Take 800 mg by mouth every 6 (six) hours as needed for fever or moderate pain.    [provider]  LORazepam (ATIVAN) 0.5 MG tablet 1 tab po q 4-6 hours prn or 1 tab po 30 minutes prior to MRI or radiation 05/12/19   Kyung Rudd, MD  memantine (NAMENDA) 5 MG tablet Start on first day of XRT: Week 1: take 1 tablet po q am Week 2: take 1 tablet po q am and 1 tablet po q pm Week 3: take 2 tablets po q am, and 1 tablets po q pm Week 4-24: take 2 tablets po q am, and 2 tablets po q pm.  Stop after 24 weeks 10/09/2019 should be last day 04/02/19   Hayden Pedro, PA-C  ondansetron (ZOFRAN) 8 MG tablet Take 1 tablet (8 mg total) by mouth 2 (two) times daily as needed for refractory nausea / vomiting. Start on day 3 after carboplatin chemo. 12/15/18   Brunetta Genera, MD  oxyCODONE (OXY IR/ROXICODONE) 5 MG immediate release tablet Take 1-2 tablets (5-10 mg total) by mouth every 6 (six) hours as needed for moderate pain. 05/13/19   Brunetta Genera, MD  oxyCODONE Alesia Morin)  10 mg 12 hr tablet Take 1 tablet (10 mg total) by mouth every 12 (twelve) hours. 04/20/19   Brunetta Genera, MD  polyethylene glycol [facility] / Floria Raveling) packet Take 17 g by mouth daily as needed for mild constipation. 12/02/18   Marcell Anger, MD    Physical Exam: BP 121/76   Pulse 65   Temp 98.2 F (36.8 C) (Oral)   Resp 16   Ht 5\' 10"  (1.778 m)   Wt 79.4 kg   SpO2 95%   BMI 25.11 kg/m   General:  Alert, oriented, calm, in no acute distress, very pleasant Eyes: EOMI, clear conjuctivae, white sclerea Neck: supple, no masses, trachea mildline  Cardiovascular: RRR, no murmurs or rubs, no peripheral edema   Respiratory: clear to auscultation bilaterally, no wheezes, no crackles  Abdomen: soft, nontender, nondistended, normal bowel tones heard  Skin: dry, no rashes  Musculoskeletal: no joint effusions, normal range of motion  Psychiatric: appropriate affect, normal speech  Neurologic: extraocular muscles intact, clear speech, moving all extremities he has pinpoint numbness on left side of his body         Labs on Admission:  Basic Metabolic Panel: Recent Labs  Lab 05/11/19 0909 05/16/19 1910  NA 135 128*  K 4.1 3.9  CL 100 93*  CO2 25 24  GLUCOSE 103* 93  BUN 12 16  CREATININE 0.79 0.65  CALCIUM 9.6 8.7*  MG 1.8  --    Liver Function Tests: Recent Labs  Lab 05/11/19 0909 05/16/19 1910  AST 35 49*  ALT 24 35  ALKPHOS 81 96  BILITOT 0.7 1.3*  PROT 8.0 8.6*  ALBUMIN 4.3 4.5   Recent Labs  Lab 05/16/19 1910  LIPASE 52*   No results for input(s): AMMONIA in the last 168 hours. CBC: Recent Labs  Lab 05/11/19 0909 05/16/19 1910  WBC 5.2 6.6  NEUTROABS 3.6  --   HGB 14.0 14.5  HCT 41.4 42.5  MCV 95.6 96.6  PLT 177 192   Cardiac Enzymes: No results for input(s): CKTOTAL, CKMB, CKMBINDEX, TROPONINI in the last 168 hours.  BNP (last 3 results) No results for input(s): BNP in the last 8760 hours.  ProBNP (last 3 results) No results for input(s): PROBNP in the last 8760 hours.  CBG: No results for input(s): GLUCAP in the last 168 hours.  Radiological Exams on Admission: Mr Thoracic Spine W Wo Contrast  Result Date: 05/16/2019 CLINICAL DATA:  Worsening radicular pain in the left arm. EXAM: MRI THORACIC WITHOUT AND WITH CONTRAST TECHNIQUE: Multiplanar and multiecho pulse sequences of the thoracic spine were obtained without and with intravenous contrast. CONTRAST:  10 cc Gadavist intravenous COMPARISON:  12/01/2018 FINDINGS: MRI THORACIC SPINE FINDINGS Alignment:  No abnormal alignment. Vertebrae: Osseous metastatic disease which is essentially confluent in the C6,  C7, T1, and T2 vertebrae, progressive from prior. Metastases also seen in the T3 spinous process and T4 through T11 vertebral bodies, most extensive in the T9 vertebral body. Cord: 11 mm mass in the spinal canal at the level of C7-T1. This appears intradural on axial postcontrast imaging but there is very limited assessment due to motion artifact. Cord edema is seen extending inferiorly. No contiguous mass is seen in the epidural space at this level. Paraspinal and other soft tissues: Left perihilar mass with mediastinal adenopathy. This is a significant change when compared to chest CT June 2020 Disc levels: Spondylosis and lower thoracic disc degeneration. No significant degenerative findings. IMPRESSION: 1. 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 Before any treatment suggest enhanced cervical MRI after symptom control. 2. Widespread osseous metastatic disease with progression from March 2020. 3. Thoracic progression from chest CT June 2020. Electronically Signed   By: Monte Fantasia M.D.   On: 05/16/2019 22:37    Assessment/Plan Present on Admission: . Hepatocellular carcinoma ([place]) . History of alcohol abuse . Metastatic cancer ([place]) . Bone metastases ([place]) . Liver metastases ([place]) . Brain metastasis ([place]) Hyponatremia  This is a very pleasant and unfortunate 64 year old male with a known history of hepatocellular carcinoma with mets to the brain, bone and liver who recently completed a course of brain radiation and underwent evaluation tonight for upper back pain with reveals suspected spinal canal mass. He needs admission for pain control and further workup of this mass. - observation admission - continue PO analgesics and start morphine IV prn severe pain - MR C spine with contrast - discussed with NSG at time of admission, they will formally consult after imaging is  complete - regular diet - resume home medications as appropriate including chronic narcotics and bowel regimen - hyponatremia is mild and likely due to his vomiting, will place on NS IVF and recheck Na in AM  DVT prophylaxis: Lovenox   Code Status: FULL   Family Communication: None   Disposition Plan: Likely home at discharge.   Consults called: None, ER discussed with NSG.   Admission status: Observation   Time spent: 23 minutes  Kaicee Scarpino Marry Guan MD [facility] Pager [phone]  If 7PM-7AM, please contact night-coverage [email or web address] Password [email or web address]  05/17/2019, 12:21 AM

## 2019-05-17 NOTE — Progress Notes (Signed)
MD returned call and will adjust pt's pain medication now. MD made aware of MRI status.

## 2019-05-17 NOTE — Progress Notes (Signed)
PROGRESS NOTE  Nayquan Evinger [facility] WGN:[id number] DOB: 01-May-1956 DOA: 05/16/2019 PCP: Default, Provider, MD   Patient is admitted due to intractable pain, vomiting and concern for spinal metastasis. He recently completed radiation for brain mets, and has chronic related left sided body numbness but has been having pain in his back for several weeks as well. The pain is a sharp stabbing ache between his shoulderblades and radiates to the sides. He has no associated numbness from this, no extremity weakness, saddle parasthesias or changes in bowel or bladder habits. He came to the ER since he was miserable from the back pain which has been worsening and when the pain gets bad he is unable to keep down anything by mouth.   ED Course: He had labs done which are unremarkable but a motion degraded MRI of the spine shows possible C7/T1 spinal canal mass. ER provider spoke with NSG who feels no intervention needed tonight but recommends admission for contrast MRI of the C spine in 24 hours, followed by their recommendations.   HPI/Recap of past 24 hours:  C/o upper back pain is not well controlled, he is not able to tolerate mri due to significant pain Reports intermittent n/v due to pain , has not been eating much for the last few weeks due to pain and n/v associated with pain  He is able to ambulate short distance without assistance, he reports has been mostly in bed or in chair at home for the last few weeks  He report left arm numbness, upper extremity with full range of motion He denies bowel and bladder incontinence  No fever  Assessment/Plan: Active Problems:   Hepatocellular carcinoma (HCC)   History of alcohol abuse   Metastatic cancer (HCC)   Bone metastases (HCC)   Liver metastases ([place])   Brain metastasis (HCC)   Cervical spinal mass (HCC)   Hyponatremia    Intractable Upper back pain , likely from tumor mets MRI of the spine shows possible C7/T1 spinal canal  mass Neurosurgery consulted in the ED who recommended c spine MRI however, patient is not able to tolerate due to pain Case discussed with neurooncology Dr Mickeal Skinner who recommend pain control, hold off mri of cervical spine, Dr Mickeal Skinner will discuss with rad onc Dr Lisbeth Renshaw on Monday Palliative care consulted for cancer pain management  N/V; chronic   reports positional and think it related to pain Prn antiemetics Started on steroids for pain control, hopefully will held n/v as well  Hyponatremia:  Sodium 128 on presentation Likely from dehydration, improved with hydration    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, spine and liver mets -diagnosed from 12/2018 Received chemotherapy in 01/2019 -finished xrt to brain  -currently on immunotherapy -case discussed with Dr Mickeal Skinner neuroonc, rad onc Dr Lisbeth Renshaw, med onc Dr Irene Limbo  added to treatment team   DVT Prophylaxis:lovenox  Code Status: full  Family Communication: patient   Disposition Plan: not ready to discharge   Consultants:  Neurosurgery Dr Saintclair Halsted  Palliative care for cancer pain control and goals of care discussion  neurooncology Dr Mickeal Skinner  Procedures:  none  Antibiotics:  none   Objective: BP 136/78 (BP Location: Left Arm)    Pulse 64    Temp 98 F (36.7 C) (Oral)    Resp 17    Ht 5\' 10"  (1.778 m)    Wt 79.4 kg    SpO2 96%    BMI 25.11 kg/m   Intake/Output Summary (Last 24 hours)  at 05/17/2019 1208 Last data filed at 05/17/2019 0344 Gross per 24 hour  Intake 1089.44 ml  Output --  Net 1089.44 ml   Filed Weights   05/16/19 1908 05/17/19 0152  Weight: 79.4 kg 79.4 kg    Exam: Patient is examined daily including today on 05/17/2019, exams remain the same as of yesterday except that has changed    General:  In pain  Cardiovascular: RRR  Respiratory: CTABL  Abdomen: Soft/ND/NT, positive BS  Musculoskeletal: No Edema  Neuro: alert, oriented   Data Reviewed: Basic Metabolic Panel: Recent Labs   Lab 05/11/19 0909 05/16/19 1910 05/17/19 0848  NA 135 128* 134*  K 4.1 3.9 4.1  CL 100 93* 99  CO2 25 24 23   GLUCOSE 103* 93 104*  BUN 12 16 14   CREATININE 0.79 0.65 0.57*  CALCIUM 9.6 8.7* 8.7*  MG 1.8  --   --    Liver Function Tests: Recent Labs  Lab 05/11/19 0909 05/16/19 1910  AST 35 49*  ALT 24 35  ALKPHOS 81 96  BILITOT 0.7 1.3*  PROT 8.0 8.6*  ALBUMIN 4.3 4.5   Recent Labs  Lab 05/16/19 1910  LIPASE 52*   No results for input(s): AMMONIA in the last 168 hours. CBC: Recent Labs  Lab 05/11/19 0909 05/16/19 1910  WBC 5.2 6.6  NEUTROABS 3.6  --   HGB 14.0 14.5  HCT 41.4 42.5  MCV 95.6 96.6  PLT 177 192   Cardiac Enzymes:   No results for input(s): CKTOTAL, CKMB, CKMBINDEX, TROPONINI in the last 168 hours. BNP (last 3 results) No results for input(s): BNP in the last 8760 hours.  ProBNP (last 3 results) No results for input(s): PROBNP in the last 8760 hours.  CBG: No results for input(s): GLUCAP in the last 168 hours.  Recent Results (from the past 240 hour(s))  SARS CORONAVIRUS 2 Nasal Swab Aptima Multi Swab     Status: None   Collection Time: 05/17/19  1:26 AM   Specimen: Aptima Multi Swab; Nasal Swab  Result Value Ref Range Status   SARS Coronavirus 2 NEGATIVE NEGATIVE Final    Comment: (NOTE) SARS-CoV-2 target nucleic acids are NOT DETECTED. The SARS-CoV-2 RNA is generally detectable in upper and lower respiratory specimens during the acute phase of infection. Negative results do not preclude SARS-CoV-2 infection, do not rule out co-infections with other pathogens, and should not be used as the sole basis for treatment or other patient management decisions. Negative results must be combined with clinical observations, patient history, and epidemiological information. The expected result is Negative. Fact Sheet for Patients: SugarRoll.be Fact Sheet for Healthcare  Providers: https://www.woods-mathews.com/ This test is not yet approved or cleared by the [place] FDA and  has been authorized for detection and/or diagnosis of SARS-CoV-2 by FDA under an Emergency Use Authorization (EUA). This EUA will remain  in effect (meaning this test can be used) for the duration of the COVID-19 declaration under Section 56 4(b)(1) of the Act, 21 U.S.C. section 360bbb-3(b)(1), unless the authorization is terminated or revoked sooner. Performed at [facility], [place] [address], [place], [place] [address]      Studies: Mr Thoracic Spine W Wo Contrast  Result Date: 05/16/2019 CLINICAL DATA:  Worsening radicular pain in the left arm. EXAM: MRI THORACIC WITHOUT AND WITH CONTRAST TECHNIQUE: Multiplanar and multiecho pulse sequences of the thoracic spine were obtained without and with intravenous contrast. CONTRAST:  10 cc Gadavist intravenous COMPARISON:  12/01/2018 FINDINGS: MRI  THORACIC SPINE FINDINGS Alignment:  No abnormal alignment. Vertebrae: Osseous metastatic disease which is essentially confluent in the C6, C7, T1, and T2 vertebrae, progressive from prior. Metastases also seen in the T3 spinous process and T4 through T11 vertebral bodies, most extensive in the T9 vertebral body. Cord: 11 mm mass in the spinal canal at the level of C7-T1. This appears intradural on axial postcontrast imaging but there is very limited assessment due to motion artifact. Cord edema is seen extending inferiorly. No contiguous mass is seen in the epidural space at this level. Paraspinal and other soft tissues: Left perihilar mass with mediastinal adenopathy. This is a significant change when compared to chest CT June 2020 Disc levels: Spondylosis and lower thoracic disc degeneration. No significant degenerative findings. IMPRESSION: 1. 11 mm spinal canal mass at C7-T1 that is favored intradural/intramedullary based on axial postcontrast images, but very limited assessment  due to degree of motion. Before any treatment suggest enhanced cervical MRI after symptom control. 2. Widespread osseous metastatic disease with progression from March 2020. 3. Thoracic progression from chest CT June 2020. Electronically Signed   By: Monte Fantasia M.D.   On: 05/16/2019 22:37    Scheduled Meds:  enoxaparin (LOVENOX) injection  40 mg Subcutaneous Daily   oxyCODONE  10 mg Oral Q12H   senna-docusate  1 tablet Oral BID    Continuous Infusions:  sodium chloride 50 mL/hr at 05/17/19 0144     Time spent: 45mins I have personally reviewed and interpreted on  05/17/2019 daily labs, imagings as discussed above under date review session and assessment and plans.  I reviewed all nursing notes, pharmacy notes, consultant notes,  vitals, pertinent old records  I have discussed plan of care as described above with RN , patient on 05/17/2019   Florencia Reasons MD, PhD, FACP  [facility] Pager [phone]. If 7PM-7AM, please contact night-coverage at [email or web address], password [facility] 05/17/2019, 12:08 PM  LOS: 0 days

## 2019-05-18 DIAGNOSIS — C7951 Secondary malignant neoplasm of bone: Secondary | ICD-10-CM | POA: Diagnosis present

## 2019-05-18 DIAGNOSIS — G893 Neoplasm related pain (acute) (chronic): Principal | ICD-10-CM

## 2019-05-18 DIAGNOSIS — C799 Secondary malignant neoplasm of unspecified site: Secondary | ICD-10-CM

## 2019-05-18 DIAGNOSIS — C7931 Secondary malignant neoplasm of brain: Secondary | ICD-10-CM

## 2019-05-18 LAB — CBC WITH DIFFERENTIAL/PLATELET
Abs Immature Granulocytes: 0.03 10*3/uL (ref 0.00–0.07)
Basophils Absolute: 0 10*3/uL (ref 0.0–0.1)
Basophils Relative: 0 %
Eosinophils Absolute: 0 10*3/uL (ref 0.0–0.5)
Eosinophils Relative: 0 %
HCT: 39.8 % (ref 39.0–52.0)
Hemoglobin: 13.5 g/dL (ref 13.0–17.0)
Immature Granulocytes: 1 %
Lymphocytes Relative: 19 %
Lymphs Abs: 0.9 10*3/uL (ref 0.7–4.0)
MCH: 33.3 pg (ref 26.0–34.0)
MCHC: 33.9 g/dL (ref 30.0–36.0)
MCV: 98 fL (ref 80.0–100.0)
Monocytes Absolute: 0.7 10*3/uL (ref 0.1–1.0)
Monocytes Relative: 14 %
Neutro Abs: 3.2 10*3/uL (ref 1.7–7.7)
Neutrophils Relative %: 66 %
Platelets: 145 10*3/uL — ABNORMAL LOW (ref 150–400)
RBC: 4.06 MIL/uL — ABNORMAL LOW (ref 4.22–5.81)
RDW: 11.9 % (ref 11.5–15.5)
WBC: 4.8 10*3/uL (ref 4.0–10.5)
nRBC: 0 % (ref 0.0–0.2)

## 2019-05-18 LAB — COMPREHENSIVE METABOLIC PANEL
ALT: 31 U/L (ref 0–44)
AST: 40 U/L (ref 15–41)
Albumin: 3.8 g/dL (ref 3.5–5.0)
Alkaline Phosphatase: 87 U/L (ref 38–126)
Anion gap: 10 (ref 5–15)
BUN: 13 mg/dL (ref 8–23)
CO2: 24 mmol/L (ref 22–32)
Calcium: 8.7 mg/dL — ABNORMAL LOW (ref 8.9–10.3)
Chloride: 100 mmol/L (ref 98–111)
Creatinine, Ser: 0.56 mg/dL — ABNORMAL LOW (ref 0.61–1.24)
GFR calc Af Amer: >60 mL/min (ref >60)
GFR calc non Af Amer: >60 mL/min (ref >60)
Glucose, Bld: 88 mg/dL (ref 70–99)
Potassium: 5.4 mmol/L — ABNORMAL HIGH (ref 3.5–5.1)
Sodium: 134 mmol/L — ABNORMAL LOW (ref 135–145)
Total Bilirubin: 0.9 mg/dL (ref 0.3–1.2)
Total Protein: 7.2 g/dL (ref 6.5–8.1)

## 2019-05-18 LAB — MAGNESIUM: Magnesium: 2.1 mg/dL (ref 1.7–2.4)

## 2019-05-18 NOTE — Progress Notes (Signed)
PROGRESS NOTE  Cody Montoya [facility] YIF:[id number] DOB: 07-26-1956 DOA: 05/16/2019 PCP: Default, Provider, MD   Patient is admitted due to intractable pain, vomiting and concern for spinal metastasis. He recently completed radiation for brain mets, and has chronic related left sided body numbness but has been having pain in his back for several weeks as well. The pain is a sharp stabbing ache between his shoulderblades and radiates to the sides. He has no associated numbness from this, no extremity weakness, saddle parasthesias or changes in bowel or bladder habits. He came to the ER since he was miserable from the back pain which has been worsening and when the pain gets bad he is unable to keep down anything by mouth.   ED Course: He had labs done which are unremarkable but a motion degraded MRI of the spine shows possible C7/T1 spinal canal mass. ER provider spoke with NSG who feels no intervention needed tonight but recommends admission for contrast MRI of the C spine in 24 hours, followed by their recommendations.   HPI/Recap of past 24 hours:  Patient has no new c/o's   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 [facility]) Active Problems:   Hepatocellular carcinoma (HCC)   History of alcohol abuse   Metastatic cancer (HCC)   Bone metastases (HCC)   Liver metastases (HCC)   Brain metastasis (HCC)   Cervical spinal mass (HCC)   Hyponatremia    Intractable Upper back pain , likely from tumor mets MRI of the spine shows possible C7/T1 spinal canal mass Neurosurgery consulted in the ED who recommended c spine MRI however, patient is not able to tolerate due to pain Case discussed with neurooncology Dr Mickeal Skinner who recommend pain control, hold off mri of cervical spine, Dr Mickeal Skinner will discuss with rad onc Dr Lisbeth Renshaw on Monday today Palliative care consulted for cancer pain management, appreciate assistance - cont IVF's for now   N/V; chronic   reports positional and think it related to pain Prn antiemetics Started on steroids for pain control, hopefully will held n/v as well  Hyponatremia:  Sodium 128 on presentation Likely from dehydration, improved with hydration    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, spine and liver mets -diagnosed from 12/2018 Received chemotherapy in 01/2019 -finished xrt to brain  -currently on immunotherapy -case discussed with Dr Mickeal Skinner neuroonc, rad onc Dr Lisbeth Renshaw, med onc Dr Irene Limbo  added to treatment team   DVT Prophylaxis:lovenox  Code Status: full  Family Communication: patient   Disposition Plan: not ready to discharge   Consultants:  Neurosurgery Dr Saintclair Halsted  Palliative care for cancer pain control and goals of care discussion  neurooncology Dr Mickeal Skinner  Procedures:  none  Antibiotics:  none   Objective: BP (!) 150/78 (BP Location: Right Arm)   Pulse 63   Temp 98.3 F (36.8 C) (Oral)   Resp 16   Ht 5\' 10"  (1.778 m)   Wt 79.4 kg   SpO2 97%   BMI 25.11 kg/m   Intake/Output Summary (Last 24 hours) at 05/18/2019 1519 Last data filed at 05/18/2019 1318 Gross per 24 hour  Intake 3640.18 ml  Output 1950 ml  Net 1690.18 ml   Filed Weights   05/16/19 1908 05/17/19 0152  Weight: 79.4 kg 79.4 kg    Exam: Patient is examined daily including today on 05/18/2019, exams remain the same as of yesterday except that has changed    General:  No distress, alert and Ox 3, pleasant  Cardiovascular: RRR  Respiratory: CTABL  Abdomen: Soft/ND/NT, positive BS  Musculoskeletal: No Edema  Neuro: alert, oriented   Data Reviewed: Basic Metabolic Panel: Recent Labs  Lab 05/16/19 1910 05/17/19 0848 05/18/19 0704  NA 128* 134* 134*  K 3.9 4.1 5.4*  CL 93* 99 100  CO2 24 23 24   GLUCOSE 93 104* 88  BUN 16 14 13   CREATININE 0.65 0.57* 0.56*  CALCIUM 8.7* 8.7* 8.7*  MG  --   --  2.1   Liver Function Tests: Recent Labs  Lab 05/16/19 1910  05/18/19 0704  AST 49* 40  ALT 35 31  ALKPHOS 96 87  BILITOT 1.3* 0.9  PROT 8.6* 7.2  ALBUMIN 4.5 3.8   Recent Labs  Lab 05/16/19 1910  LIPASE 52*   No results for input(s): AMMONIA in the last 168 hours. CBC: Recent Labs  Lab 05/16/19 1910 05/18/19 0704  WBC 6.6 4.8  NEUTROABS  --  3.2  HGB 14.5 13.5  HCT 42.5 39.8  MCV 96.6 98.0  PLT 192 145*   Cardiac Enzymes:   No results for input(s): CKTOTAL, CKMB, CKMBINDEX, TROPONINI in the last 168 hours. BNP (last 3 results) No results for input(s): BNP in the last 8760 hours.  ProBNP (last 3 results) No results for input(s): PROBNP in the last 8760 hours.  CBG: No results for input(s): GLUCAP in the last 168 hours.  Recent Results (from the past 240 hour(s))  SARS CORONAVIRUS 2 Nasal Swab Aptima Multi Swab     Status: None   Collection Time: 05/17/19  1:26 AM   Specimen: Aptima Multi Swab; Nasal Swab  Result Value Ref Range Status   SARS Coronavirus 2 NEGATIVE NEGATIVE Final    Comment: (NOTE) SARS-CoV-2 target nucleic acids are NOT DETECTED. The SARS-CoV-2 RNA is generally detectable in upper and lower respiratory specimens during the acute phase of infection. Negative results do not preclude SARS-CoV-2 infection, do not rule out co-infections with other pathogens, and should not be used as the sole basis for treatment or other patient management decisions. Negative results must be combined with clinical observations, patient history, and epidemiological information. The expected result is Negative. Fact Sheet for Patients: SugarRoll.be Fact Sheet for Healthcare Providers: https://www.woods-mathews.com/ This test is not yet approved or cleared by the [place] FDA and  has been authorized for detection and/or diagnosis of SARS-CoV-2 by FDA under an Emergency Use Authorization (EUA). This EUA will remain  in effect (meaning this test can be used) for the duration of  the COVID-19 declaration under Section 56 4(b)(1) of the Act, 21 U.S.C. section 360bbb-3(b)(1), unless the authorization is terminated or revoked sooner. Performed at [facility], [place] [address], [place], [place] [address]      Studies: No results found.  Scheduled Meds: . dexamethasone (DECADRON) injection  8 mg Intravenous Q12H  . enoxaparin (LOVENOX) injection  40 mg Subcutaneous Daily  . ondansetron (ZOFRAN) IV  4 mg Intravenous Q8H  . oxyCODONE  10 mg Oral Q12H  . senna-docusate  1 tablet Oral BID    Continuous Infusions: . sodium chloride 50 mL/hr at 05/18/19 1028     Sol Blazing MD, PhD, [facility] Pager [phone]. If 7PM-7AM, please contact night-coverage at [email or web address], password [facility] 05/18/2019, 3:19 PM  LOS: 1 day

## 2019-05-18 NOTE — Progress Notes (Addendum)
HEMATOLOGY-ONCOLOGY PROGRESS NOTE  SUBJECTIVE: The patient is admitted for uncontrolled pain.  He recently completed radiation for brain mets.  He reports ongoing left-sided numbness and he was having difficulty controlling his pain with oral medications at home.  MRI of the thoracic spine showed an 11 mm spinal canal mass at C7-T1 that is favored intradural/intramedullary based on axial postcontrast images but the assessment is limited due to the degree of motion.  He also has widespread osseous metastatic disease with progression from March 2020.  He has had thoracic progression from the CT of the chest from June 2020.  The patient was seen by neurosurgery yesterday who recommends proceeding with a cervical spine MRI.  Hospitalist spoke with neuro oncology who recommends holding off on the cervical MRI pending further discussion with Dr. Lisbeth Renshaw.  Palliative care is following for assistance with pain control.  When seen today, the patient reports that his pain is better.  He is taking IV pain medications and needs them about every 3-4 hours.  Denies any weakness in his lower extremities and no difficulty controlling his bowel or bladder.  He denies any other symptoms today.  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[place])   12/15/2018 -  Chemotherapy   The patient had palonosetron (ALOXI) injection 0.25 mg, 0.25 mg, Intravenous,  Once, 4 of 4 cycles Administration: 0.25 mg (12/15/2018), 0.25 mg (01/05/2019), 0.25 mg (01/26/2019), 0.25 mg (02/17/2019) pegfilgrastim (NEULASTA ONPRO KIT) injection 6 mg, 6 mg, Subcutaneous, Once, 3 of 3 cycles Administration: 6 mg (01/07/2019), 6 mg (01/28/2019), 6 mg (02/19/2019) pegfilgrastim-cbqv (UDENYCA) injection 6 mg, 6 mg, Subcutaneous, Once, 1 of 1 cycle Administration: 6 mg (12/16/2018) CARBOplatin (PARAPLATIN) 340 mg in sodium chloride 0.9 % 250 mL chemo  infusion, 340 mg (100 % of original dose 335.4 mg), Intravenous,  Once, 4 of 4 cycles Dose modification: 335.4 mg (original dose 335.4 mg, Cycle 1),   (original dose 590.4 mg, Cycle 2), 700 mg (original dose 590.4 mg, Cycle 4, Reason: Provider Judgment) Administration: 340 mg (12/15/2018), 550 mg (01/05/2019), 550 mg (01/26/2019), 700 mg (02/17/2019) etoposide (VEPESID) 160 mg in sodium chloride 0.9 % 500 mL chemo infusion, 75 mg/m2 = 160 mg (100 % of original dose 75 mg/m2), Intravenous,  Once, 4 of 4 cycles Dose modification: 75 mg/m2 (original dose 75 mg/m2, Cycle 1, Reason: Provider Judgment), 50 mg/m2 (original dose 50 mg/m2, Cycle 2, Reason: Change in LFTs) Administration: 160 mg (12/15/2018), 110 mg (01/05/2019), 110 mg (01/07/2019), 110 mg (01/26/2019), 110 mg (01/28/2019), 110 mg (02/17/2019), 110 mg (02/19/2019), 110 mg (01/06/2019), 110 mg (01/27/2019), 110 mg (02/18/2019) fosaprepitant (EMEND) 150 mg, dexamethasone (DECADRON) 12 mg in sodium chloride 0.9 % 145 mL IVPB, , Intravenous,  Once, 4 of 4 cycles Administration:  (12/15/2018),  (01/05/2019),  (01/26/2019),  (02/17/2019) atezolizumab (TECENTRIQ) 1,200 mg in sodium chloride 0.9 % 250 mL chemo infusion, 1,200 mg, Intravenous, Once, 7 of 8 cycles Administration: 1,200 mg (01/05/2019), 1,200 mg (01/26/2019), 1,200 mg (02/17/2019), 1,200 mg (03/09/2019), 1,200 mg (03/30/2019), 1,200 mg (04/20/2019), 1,200 mg (05/11/2019)  for chemotherapy treatment.       REVIEW OF SYSTEMS:   A comprehensive 14 point review of systems was negative except as noted in the HPI.  I have reviewed the past medical history, past surgical history, social history and family history with the patient and they are unchanged from previous note.   PHYSICAL  EXAMINATION: ECOG PERFORMANCE STATUS: 2 - Symptomatic, <50% confined to bed  Vitals:   05/17/19 2023 05/18/19 0543  BP: 135/67 (!) 150/78  Pulse: 65 63  Resp: 18 16  Temp: 98.7 F (37.1 C) 98.3 F (36.8 C)  SpO2: 98% 97%   Filed  Weights   05/16/19 1908 05/17/19 0152  Weight: 175 lb (79.4 kg) 175 lb (79.4 kg)    Intake/Output from previous day: 08/23 0701 - 08/24 0700 In: 1867.2 [I.V.:1867.2] Out: 1350 [Urine:300; Emesis/NG output:1050]  GENERAL:alert, no distress and comfortable SKIN: skin color, texture, turgor are normal, no rashes or significant lesions EYES: normal, Conjunctiva are pink and non-injected, sclera clear OROPHARYNX:no exudate, no erythema and lips, buccal mucosa, and tongue normal  NECK: supple, thyroid normal size, non-tender, without nodularity LYMPH:  no palpable lymphadenopathy in the cervical, axillary or inguinal LUNGS: clear to auscultation and percussion with normal breathing effort HEART: regular rate & rhythm and no murmurs and no lower extremity edema ABDOMEN:abdomen soft, non-tender and normal bowel sounds Musculoskeletal:no cyanosis of digits and no clubbing  NEURO: alert & oriented x 3 with fluent speech, no focal motor/sensory deficits  LABORATORY DATA:  I have reviewed the data as listed CMP Latest Ref Rng & Units 05/18/2019 05/17/2019 05/16/2019  Glucose 70 - 99 mg/dL 88 104(H) 93  BUN 8 - 23 mg/dL _0 Creatinine 0.61 - 1.24 mg/dL 0.56(L) 0.57(L) 0.65  Sodium 135 - 145 mmol/L 134(L) 134(L) 128(L)  Potassium 3.5 - 5.1 mmol/L 5.4(H) 4.1 3.9  Chloride 98 - 111 mmol/L 100 99 93(L)  CO2 22 - 32 mmol/L _1 Calcium 8.9 - 10.3 mg/dL 8.7(L) 8.7(L) 8.7(L)  Total Protein 6.5 - 8.1 g/dL 7.2 - 8.6(H)  Total Bilirubin 0.3 - 1.2 mg/dL 0.9 - 1.3(H)  Alkaline Phos 38 - 126 U/L 87 - 96  AST 15 - 41 U/L 40 - 49(H)  ALT 0 - 44 U/L 31 - 35    Lab Results  Component Value Date   WBC 4.8 05/18/2019   HGB 13.5 05/18/2019   HCT 39.8 05/18/2019   MCV 98.0 05/18/2019   PLT 145 (L) 05/18/2019   NEUTROABS 3.2 05/18/2019    Dg Ribs Bilateral W/chest  Result Date: 04/20/2019 CLINICAL DATA:  Right-sided chest wall pain. Fell. History of metastatic small cell lung cancer. EXAM:  BILATERAL RIBS AND CHEST - 4+ VIEW COMPARISON:  Chest CT 03/03/2019 FINDINGS: The cardiac silhouette, mediastinal and hilar contours are within normal limits and stable. Left upper lobe scarring changes are noted and likely related to prior radiation. Bilateral nipple shadows but no worrisome pulmonary lesions. Underlying emphysematous changes. There is a left-sided rib fracture involving the seventh anterolateral rib. Seventh and eighth right rib fractures are also noted. No pleural effusions or pneumothorax. IMPRESSION: Bilateral nondisplaced rib fractures. No acute pulmonary findings. Electronically Signed   By: Marijo Sanes M.D.   On: 04/20/2019 12:34   Dg Thoracic Spine 2 View  Result Date: 04/20/2019 CLINICAL DATA:  Back pain.  Fell. EXAM: THORACIC SPINE 2 VIEWS COMPARISON:  Chest CT 03/03/2019 FINDINGS: CT scan 03/03/2019 moderate to advanced lumbar spine degenerative changes for age. No acute fracture or obvious bone lesions. No significant paraspinal findings. IMPRESSION: Normal alignment and no acute bony findings. Electronically Signed   By: Marijo Sanes M.D.   On: 04/20/2019 12:38   Mr Thoracic Spine W Wo Contrast  Result Date: 05/16/2019 CLINICAL DATA:  Worsening radicular pain in the left arm. EXAM: MRI  THORACIC WITHOUT AND WITH CONTRAST TECHNIQUE: Multiplanar and multiecho pulse sequences of the thoracic spine were obtained without and with intravenous contrast. CONTRAST:  10 cc Gadavist intravenous COMPARISON:  12/01/2018 FINDINGS: MRI THORACIC SPINE FINDINGS Alignment:  No abnormal alignment. Vertebrae: Osseous metastatic disease which is essentially confluent in the C6, C7, T1, and T2 vertebrae, progressive from prior. Metastases also seen in the T3 spinous process and T4 through T11 vertebral bodies, most extensive in the T9 vertebral body. Cord: 11 mm mass in the spinal canal at the level of C7-T1. This appears intradural on axial postcontrast imaging but there is very limited  assessment due to motion artifact. Cord edema is seen extending inferiorly. No contiguous mass is seen in the epidural space at this level. Paraspinal and other soft tissues: Left perihilar mass with mediastinal adenopathy. This is a significant change when compared to chest CT June 2020 Disc levels: Spondylosis and lower thoracic disc degeneration. No significant degenerative findings. IMPRESSION: 1. 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 Before any treatment suggest enhanced cervical MRI after symptom control. 2. Widespread osseous metastatic disease with progression from March 2020. 3. Thoracic progression from chest CT June 2020. Electronically Signed   By: Monte Fantasia M.D.   On: 05/16/2019 22:37    ASSESSMENT AND PLAN: 63 y.o. male with  1. 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 2. Mild chronic small vessel ischemic disease."   2.  Abnormal liver function tests.  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  Bump in bilirubin could be from progression of his liver mets.   12/18/18 US Abdomen revealed Widespread metastatic disease throughout the liver. The liver has a somewhat nodular contour suggesting underlying cirrhosis. 2. No gallstones seen. Gallbladder wall appears mildly thickened and edematous. Sludge is noted in the gallbladder. There is also prominence of the common bile duct without mass or calculus evident. These findings raise concern for potential degree of acalculus cholecystitis. In this regard, it may be prudent to consider nuclear medicine hepatobiliary imaging study to assess for cystic duct patency.  01/02/19 NM Hepatobiliary including GB revealed No biliary obstruction. 2. Normal  hepatobiliary scan.  S/p 4 cycles of Carboplatin AUC of 5, 28m/m2 Etoposide, and Atezolizumab at this time.  03/03/19 CT C/A/P revealed "Substantial partial treatment response as detailed below. No new or progressive metastatic disease. 2. Central left upper lobe lung mass is substantially decreased in size. 3. Mediastinal adenopathy is substantially decreased, with residual mild AP window adenopathy. Retroperitoneal adenopathy has resolved. 4. Liver metastases are decreased. 5. Stable faintly sclerotic osseous lesions in the spine and left iliac wing. 6. Stable irregular thickening of the adrenal glands without discrete adrenal nodules. 7. Aortic Atherosclerosis."  Previously discussed the NCCN guidelines of 4-6 cycles of treatment, followed by maintenance immunotherapy. Pt's disease has responded well, and discussed risks vs benefits of completing 1-2 additional cycles (pt has completed 4 cycles thus far), in setting of patient's liver cirrhosis and palliative treatment. Discussed patient's goals of care. Patient's goals are to stop chemotherapy treatment after C4, and will focus on spending quality time with friends and family.  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 No significant edema or mass effect."  05/16/2019 thoracic spine MRI shows an 11 mm spinal  canal mass at C7-T1 favored intradural/intramedullary based on axial postcontrast images but limited assessment due to degree of motion.  An MRI of the cervical spine was recommended.  Widespread osseous metastatic disease with progression from March 2020.  Thoracic progression from CT chest performed in June 2020.  PLAN: -Await further recommendations from neuro oncology and radiation oncology regarding cervical spine MRI. -Continue dexamethasone -Continue current pain medications.  Appreciate assistance from palliative care team. -Patient may need complete restaging  work-up including CT chest, abdomen, pelvis during his inpatient stay.  Will discuss further with Dr. Irene Limbo.   LOS: 1 day   Mikey Bussing, DNP, AGPCNP-BC, AOCNP 05/18/19   ADDENDUM  .Patient was Personally and independently interviewed, examined and relevant elements of the history of present illness were reviewed in details and an assessment and plan was created. All elements of the patient's history of present illness , assessment and plan were discussed in details with Mikey Bussing, DNP . The above documentation reflects our combined findings assessment and plan.  -Pain mx per hospitalist -rpt MRI t spine with better control/short induction anesthesia being considered for better images -neuro-onc aware of patient and rad oncology was planned to be involved to determine role for palliative RT for control of spine/back issues. -if pain controlled would recommend rpt CT neck/chest/abd/pelvis to re-evaluate systemic progression to determine goals of care and better prognostication and determination of specific interventions.  Sullivan Lone MD MS

## 2019-05-18 NOTE — Progress Notes (Signed)
PMT progress note  Patient is awake alert resting in bed.  He has received IV morphine every 3-4 hours or so.  He rested well overnight.  Denies any weakness in lower extremities no urine or bowel incontinence.  No family present at the bedside.  Patient states his son dropped off some essentials earlier today.  His ex-wife is at home.  He has 2 sons.  He is awaiting further input from oncology as well as radiation oncology.  He states that he simply could not lay flat for the cervical MRI that was attempted yesterday.  BP (!) 150/78 (BP Location: Right Arm)   Pulse 63   Temp 98.3 F (36.8 C) (Oral)   Resp 16   Ht 5\' 10"  (1.778 m)   Wt 79.4 kg   SpO2 97%   BMI 25.11 kg/m   Labs and imaging noted. Discussed with TRH MD. Awake alert oriented sitting up in bed he ate most of his breakfast today. S1-S2 Regular Abdomen soft nontender Still has tenderness right scapular area, upper back area Nonfocal Palliative performance scale 40%  Medication history noted.  Pain management regimen discussed with patient today.  Continue current mode of care for now.  Patient is on scheduled OxyContin that will need to be increased depending on his IV morphine use.  PMT to follow.  For now, needs IV opioids because of severe pain.  63 year old gentleman with extensive stage small cell carcinoma, widespread metastatic disease throughout the liver, concern for thoracic MRI showing spinal canal mass at C7-T1.  Widespread osseous metastatic disease with disease progression also noted. Oncology is following, patient likely to also be evaluated by radiation oncology.  Remains on pain medicines and steroids.  Palliative medicine team to continue to follow along for pain management and goals of care discussions.  25 minutes spent. Loistine Chance MD [facility] palliative team [phone]  Greater than 50%  of this time was spent counseling and coordinating care related to the above assessment and plan.

## 2019-05-19 ENCOUNTER — Ambulatory Visit (HOSPITAL_COMMUNITY): Payer: Self-pay

## 2019-05-19 ENCOUNTER — Telehealth: Payer: Self-pay | Admitting: *Deleted

## 2019-05-19 ENCOUNTER — Encounter (HOSPITAL_COMMUNITY): Payer: Self-pay

## 2019-05-19 DIAGNOSIS — C3490 Malignant neoplasm of unspecified part of unspecified bronchus or lung: Secondary | ICD-10-CM

## 2019-05-19 DIAGNOSIS — Z515 Encounter for palliative care: Secondary | ICD-10-CM

## 2019-05-19 LAB — BASIC METABOLIC PANEL
Anion gap: 6 (ref 5–15)
BUN: 12 mg/dL (ref 8–23)
CO2: 25 mmol/L (ref 22–32)
Calcium: 8.8 mg/dL — ABNORMAL LOW (ref 8.9–10.3)
Chloride: 101 mmol/L (ref 98–111)
Creatinine, Ser: 0.51 mg/dL — ABNORMAL LOW (ref 0.61–1.24)
GFR calc Af Amer: >60 mL/min (ref >60)
GFR calc non Af Amer: >60 mL/min (ref >60)
Glucose, Bld: 135 mg/dL — ABNORMAL HIGH (ref 70–99)
Potassium: 4.5 mmol/L (ref 3.5–5.1)
Sodium: 132 mmol/L — ABNORMAL LOW (ref 135–145)

## 2019-05-19 MED ORDER — OXYCODONE HCL ER 20 MG PO T12A
20.0000 mg | EXTENDED_RELEASE_TABLET | Freq: Two times a day (BID) | ORAL | Status: DC
  Administered 2019-05-19 – 2019-05-23 (×7): 20 mg via ORAL
  Filled 2019-05-19 (×8): qty 1

## 2019-05-19 MED ORDER — CALCIUM CARBONATE ANTACID 500 MG PO CHEW
1.0000 | CHEWABLE_TABLET | Freq: Two times a day (BID) | ORAL | Status: DC | PRN
  Administered 2019-05-19: 21:00:00 200 mg via ORAL
  Filled 2019-05-19: qty 1

## 2019-05-19 NOTE — Progress Notes (Signed)
PROGRESS NOTE  Cody Montoya [facility] ZOX:[id number] DOB: [birth year] DOA: 05/16/2019 PCP: Default, Provider, MD   Patient is admitted due to intractable pain, vomiting and concern for spinal metastasis. He recently completed radiation for brain mets, and has chronic related left sided body numbness but has been having pain in his back for several weeks as well. The pain is a sharp stabbing ache between his shoulderblades and radiates to the sides. He has no associated numbness from this, no extremity weakness, saddle parasthesias or changes in bowel or bladder habits. He came to the ER since he was miserable from the back pain which has been worsening and when the pain gets bad he is unable to keep down anything by mouth.   ED Course: He had labs done which are unremarkable but a motion degraded MRI of the spine shows possible C7/T1 spinal canal mass. ER provider spoke with NSG who feels no intervention needed tonight but recommends admission for contrast MRI of the C spine in 24 hours, followed by their recommendations.   HPI/Recap of past 24 hours:  Patient has no new c/o's. Pain under little better control.    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[facility])   Hyponatremia    Intractable Upper back pain , likely from tumor mets MRI of the spine shows possible C7/T1 spinal canal mass Neurosurgery consulted in the ED who recommended c spine MRI however, patient was not able to tolerate due to pain - Case discussed with neurooncology Dr Mickeal Skinner who recommend pain control - Palliative care consulted for cancer pain management, appreciate assistance - Seen by Dr Irene Limbo Monday - I just ordered MRI this afternoon, of T/C spine  at [facility] w/ gen anesthesia monitoring and have spoken w/ MRI dept about details, pt does not need transfer to [facility] , they will use [facility] for [facility] then return to [facility]    N/V; chronic   reports positional and think it related to pain Prn antiemetics Started on steroids for pain control, hopefully will held n/v as well  Hyponatremia:  Sodium 128 on presentation > 132 now Likely from dehydration  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, spine and liver mets -diagnosed from 12/2018 Received chemotherapy in 01/2019 -finished xrt to brain  -currently on immunotherapy -case discussed with Dr Mickeal Skinner neuroonc, rad onc Dr Lisbeth Renshaw, med onc Dr Irene Limbo  added to treatment team   DVT Prophylaxis: lovenox Code Status: full  Family Communication: patient  Disposition Plan: not ready to discharge  Kelly Splinter MD  [facility]  pgr [phone] 05/19/2019, 4:33 PM    Consultants:  Neurosurgery Dr Saintclair Halsted  Palliative care for cancer pain control and goals of care discussion  neurooncology Dr Mickeal Skinner  Dr Irene Limbo med onc  Procedures:  none  Antibiotics:  none   Objective: BP 138/69 (BP Location: Right Arm)   Pulse 68   Temp 98 F (36.7 C) (Oral)   Resp 16   Ht 5\' 10"  (1.778 m)   Wt 79.4 kg   SpO2 98%   BMI 25.11 kg/m   Intake/Output Summary (Last 24 hours) at 05/19/2019 1620 Last data filed at 05/19/2019 1207 Gross per 24 hour  Intake 1301 ml  Output  500 ml  Net 801 ml   Filed Weights   05/16/19 1908 05/17/19 0152  Weight: 79.4 kg 79.4 kg    Exam: Patient is examined daily including today on 05/19/2019, exams remain the same as of yesterday except that has changed    General:  No distress, alert and Ox 3, pleasant  Cardiovascular: RRR  Respiratory: CTABL  Abdomen: Soft/ND/NT, positive BS  Musculoskeletal: No Edema  Neuro: alert, oriented   Data Reviewed: Basic Metabolic Panel: Recent Labs  Lab 05/16/19 1910 05/17/19 0848 05/18/19 0704 05/19/19 0602   NA 128* 134* 134* 132*  K 3.9 4.1 5.4* 4.5  CL 93* 99 100 101  CO2 24 23 24 25   GLUCOSE 93 104* 88 135*  BUN 16 14 13 12   CREATININE 0.65 0.57* 0.56* 0.51*  CALCIUM 8.7* 8.7* 8.7* 8.8*  MG  --   --  2.1  --    Liver Function Tests: Recent Labs  Lab 05/16/19 1910 05/18/19 0704  AST 49* 40  ALT 35 31  ALKPHOS 96 87  BILITOT 1.3* 0.9  PROT 8.6* 7.2  ALBUMIN 4.5 3.8   Recent Labs  Lab 05/16/19 1910  LIPASE 52*   No results for input(s): AMMONIA in the last 168 hours. CBC: Recent Labs  Lab 05/16/19 1910 05/18/19 0704  WBC 6.6 4.8  NEUTROABS  --  3.2  HGB 14.5 13.5  HCT 42.5 39.8  MCV 96.6 98.0  PLT 192 145*   Cardiac Enzymes:   No results for input(s): CKTOTAL, CKMB, CKMBINDEX, TROPONINI in the last 168 hours. BNP (last 3 results) No results for input(s): BNP in the last 8760 hours.  ProBNP (last 3 results) No results for input(s): PROBNP in the last 8760 hours.  CBG: No results for input(s): GLUCAP in the last 168 hours.  Recent Results (from the past 240 hour(s))  SARS CORONAVIRUS 2 Nasal Swab Aptima Multi Swab     Status: None   Collection Time: 05/17/19  1:26 AM   Specimen: Aptima Multi Swab; Nasal Swab  Result Value Ref Range Status   SARS Coronavirus 2 NEGATIVE NEGATIVE Final    Comment: (NOTE) SARS-CoV-2 target nucleic acids are NOT DETECTED. The SARS-CoV-2 RNA is generally detectable in upper and lower respiratory specimens during the acute phase of infection. Negative results do not preclude SARS-CoV-2 infection, do not rule out co-infections with other pathogens, and should not be used as the sole basis for treatment or other patient management decisions. Negative results must be combined with clinical observations, patient history, and epidemiological information. The expected result is Negative. Fact Sheet for Patients: SugarRoll.be Fact Sheet for Healthcare Providers: https://www.woods-mathews.com/  This test is not yet approved or cleared by the [place] FDA and  has been authorized for detection and/or diagnosis of SARS-CoV-2 by FDA under an Emergency Use Authorization (EUA). This EUA will remain  in effect (meaning this test can be used) for the duration of the COVID-19 declaration under Section 56 4(b)(1) of the Act, 21 U.S.C. section 360bbb-3(b)(1), unless the authorization is terminated or revoked sooner. Performed at [facility], [place] [address], [place], [place] [address]      Studies: No results found.  Scheduled Meds: . dexamethasone (DECADRON) injection  8 mg Intravenous Q12H  . enoxaparin (LOVENOX) injection  40 mg Subcutaneous Daily  . oxyCODONE  20 mg Oral Q12H  . senna-docusate  1 tablet Oral BID    Continuous Infusions: . sodium chloride 50 mL/hr at 05/19/19 [phone]

## 2019-05-19 NOTE — Progress Notes (Signed)
PROGRESS NOTE  Cody Montoya [facility] SPQ:[id number] DOB: [birth year] DOA: 05/16/2019 PCP: Default, Provider, MD   Patient is admitted due to intractable pain, vomiting and concern for spinal metastasis. He recently completed radiation for brain mets, and has chronic related left sided body numbness but has been having pain in his back for several weeks as well. The pain is a sharp stabbing ache between his shoulderblades and radiates to the sides. He has no associated numbness from this, no extremity weakness, saddle parasthesias or changes in bowel or bladder habits. He came to the ER since he was miserable from the back pain which has been worsening and when the pain gets bad he is unable to keep down anything by mouth.   ED Course: He had labs done which are unremarkable but a motion degraded MRI of the spine shows possible C7/T1 spinal canal mass. ER provider spoke with NSG who feels no intervention needed tonight but recommends admission for contrast MRI of the C spine in 24 hours, followed by their recommendations.   HPI/Recap of past 24 hours:  Patient has no new c/o's , getting ^'d pain meds by assist of Dr Domingo Cocking w/ pall care.    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 [facility]) Active Problems:   Hepatocellular carcinoma (HCC)   History of alcohol abuse   Metastatic cancer ([place])   Bone metastases ([place])   Liver metastases ([place])   Brain metastasis (HCC)   Cervical spinal mass (HCC)   Hyponatremia   Primary small cell malignant neoplasm of lung, stage 4 (HCC)    Intractable Upper back pain , likely from tumor mets MRI of the spine shows possible C7/T1 spinal canal mass Neurosurgery consulted in the ED who recommended c spine MRI however, patient is not able to tolerate due to pain - case discussed with neurooncology Dr Mickeal Skinner today who recommends continue efforts to get MR imaging > have d/w MRI at [facility], needs MRI w/  gen anesthesia assist. Have placed order as of this afternoon for this. He does not need admit to [facility], will be transferred back and forth by [facility]  - Palliative care consulted for cancer pain mgmnt and are actively working w/ pt for pain control; pain appears better controlled today - case discussed w/ Dr Irene Limbo today, we should go ahead and get pan/ body CT as well to be sure we don't have any other severe progression  - cont IVF's for now  N/V; chronic   reports positional and think it related to pain Prn antiemetics Started on steroids for pain control, hopefully will held n/v as well  Hyponatremia:  Sodium 128 on presentation Likely from dehydration, improved with hydration  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, spine and liver mets -diagnosed from 12/2018 Received chemotherapy in 01/2019 -finished xrt to brain  -currently on immunotherapy   DVT Prophylaxis:lovenox Code Status: full Family Communication: patient  Disposition Plan: not ready to discharge  Kelly Splinter, MD  [place]  05/19/2019, 5:05 PM    Consultants:  Neurosurgery Dr Saintclair Halsted  Palliative care for cancer pain control and goals of care discussion  neurooncology Dr Mickeal Skinner , Dr Irene Limbo medical onc  Procedures:  none  Antibiotics:  none   Objective: BP 138/69 (BP Location: Right Arm)   Pulse 68   Temp 98 F (36.7 C) (Oral)   Resp 16   Ht 5\' 10"  (1.778 m)   Wt 79.4 kg  SpO2 98%   BMI 25.11 kg/m   Intake/Output Summary (Last 24 hours) at 05/19/2019 1701 Last data filed at 05/19/2019 1207 Gross per 24 hour  Intake 1301 ml  Output 500 ml  Net 801 ml   Filed Weights   05/16/19 1908 05/17/19 0152  Weight: 79.4 kg 79.4 kg    Exam: Patient is examined daily including today on 05/19/2019, exams remain the same as of yesterday except that has changed    General:  No distress, alert and Ox 3, some slurring of speech from pain meds today  Cardiovascular: RRR  Respiratory: CTABL   Abdomen: Soft/ND/NT, positive BS  Musculoskeletal: No Edema  Neuro: alert, oriented   Data Reviewed: Basic Metabolic Panel: Recent Labs  Lab 05/16/19 1910 05/17/19 0848 05/18/19 0704 05/19/19 0602  NA 128* 134* 134* 132*  K 3.9 4.1 5.4* 4.5  CL 93* 99 100 101  CO2 24 23 24 25   GLUCOSE 93 104* 88 135*  BUN 16 14 13 12   CREATININE 0.65 0.57* 0.56* 0.51*  CALCIUM 8.7* 8.7* 8.7* 8.8*  MG  --   --  2.1  --    Liver Function Tests: Recent Labs  Lab 05/16/19 1910 05/18/19 0704  AST 49* 40  ALT 35 31  ALKPHOS 96 87  BILITOT 1.3* 0.9  PROT 8.6* 7.2  ALBUMIN 4.5 3.8   Recent Labs  Lab 05/16/19 1910  LIPASE 52*   No results for input(s): AMMONIA in the last 168 hours. CBC: Recent Labs  Lab 05/16/19 1910 05/18/19 0704  WBC 6.6 4.8  NEUTROABS  --  3.2  HGB 14.5 13.5  HCT 42.5 39.8  MCV 96.6 98.0  PLT 192 145*   Cardiac Enzymes:   No results for input(s): CKTOTAL, CKMB, CKMBINDEX, TROPONINI in the last 168 hours. BNP (last 3 results) No results for input(s): BNP in the last 8760 hours.  ProBNP (last 3 results) No results for input(s): PROBNP in the last 8760 hours.  CBG: No results for input(s): GLUCAP in the last 168 hours.  Recent Results (from the past 240 hour(s))  SARS CORONAVIRUS 2 Nasal Swab Aptima Multi Swab     Status: None   Collection Time: 05/17/19  1:26 AM   Specimen: Aptima Multi Swab; Nasal Swab  Result Value Ref Range Status   SARS Coronavirus 2 NEGATIVE NEGATIVE Final    Comment: (NOTE) SARS-CoV-2 target nucleic acids are NOT DETECTED. The SARS-CoV-2 RNA is generally detectable in upper and lower respiratory specimens during the acute phase of infection. Negative results do not preclude SARS-CoV-2 infection, do not rule out co-infections with other pathogens, and should not be used as the sole basis for treatment or other patient management decisions. Negative results must be combined with clinical observations, patient history, and  epidemiological information. The expected result is Negative. Fact Sheet for Patients: SugarRoll.be Fact Sheet for Healthcare Providers: https://www.woods-mathews.com/ This test is not yet approved or cleared by the [place] FDA and  has been authorized for detection and/or diagnosis of SARS-CoV-2 by FDA under an Emergency Use Authorization (EUA). This EUA will remain  in effect (meaning this test can be used) for the duration of the COVID-19 declaration under Section 56 4(b)(1) of the Act, 21 U.S.C. section 360bbb-3(b)(1), unless the authorization is terminated or revoked sooner. Performed at [facility], [place] [address] [place], [place] [address]      Studies: No results found.  Scheduled Meds: . dexamethasone (DECADRON) injection  8 mg Intravenous Q12H  .  enoxaparin (LOVENOX) injection  40 mg Subcutaneous Daily  . oxyCODONE  20 mg Oral Q12H  . senna-docusate  1 tablet Oral BID    Continuous Infusions: . sodium chloride 50 mL/hr at 05/19/19 [phone]

## 2019-05-19 NOTE — Telephone Encounter (Signed)
Palliative NP Gonzella Lex called to report discussion with patients significant other (ex wife/HCPOA).  Inpatient and she would refer to inpatient palliative.  End goal at discharge is dc to home.  Will need Hospital bed.  Pending results and determination from MRI and discussion at [place] which will happen tomorrow 05/20/2019.    Routed to Dr. Mickeal Skinner.

## 2019-05-19 NOTE — Progress Notes (Signed)
Daily Progress Note   Patient Name: Cody Montoya [facility]       Date: 05/19/2019 DOB: 1956/01/18  Age: 63 y.o. MRN#: [id number] Attending Physician: Roney Jaffe, MD Primary Care Physician: Cody Montoya, Provider, MD Admit Date: 05/16/2019  Reason for Consultation/Follow-up: Establishing goals of care and Pain control  Subjective: I saw and examined Cody Montoya today.  He reports that he is currently in 9/10 pain and has already called for pain medication.  Overall, states that pain has been pretty well controlled on current regimen of Oxycontin and IV morphine prn.  Has had 24 mg of IV morphine in the last 24 hours.   We reviewed his pain regimen in detail and discussed next steps with pain management.  Plan to increase long acting agent today.  Based on his overall usage, will plan to increase to Oxycontin 20 BID with understanding that this is likely to need to be increased further but want to be judicious with increase as he is concerned about side effects.  Discussed plan for further imaging and then discussion with radiation oncology/medical oncology regarding recommendations for next steps.  Last BM was yesterday.  Length of Stay: 2  Current Medications: Scheduled Meds:  . dexamethasone (DECADRON) injection  8 mg Intravenous Q12H  . enoxaparin (LOVENOX) injection  40 mg Subcutaneous Daily  . oxyCODONE  20 mg Oral Q12H  . senna-docusate  1 tablet Oral BID    Continuous Infusions: . sodium chloride 50 mL/hr at 05/19/19 0644    PRN Meds: morphine injection, ondansetron (ZOFRAN) IV, oxyCODONE, polyethylene glycol, traZODone  Physical Exam      General: Alert, awake, in no acute distress.  HEENT: No bruits, no goiter, no JVD Heart: Regular rate and rhythm. No murmur appreciated.  Lungs: Good air movement, clear Abdomen: Soft, nontender, nondistended, positive bowel sounds.  Ext: No significant edema Skin: Warm and dry Neuro: Grossly intact, nonfocal.     Vital Signs: BP 138/69 (BP Location: Right Arm)   Pulse 68   Temp 98 F (36.7 C) (Oral)   Resp 16   Ht 5\' 10"  (1.778 m)   Wt 79.4 kg   SpO2 98%   BMI 25.11 kg/m  SpO2: SpO2: 98 % O2 Device: O2 Device: Room Air O2 Flow Rate:    Intake/output summary:   Intake/Output Summary (Last  24 hours) at 05/19/2019 1757 Last data filed at 05/19/2019 1207 Gross per 24 hour  Intake 1301 ml  Output 500 ml  Net 801 ml   LBM: Last BM Date: 05/16/19 Baseline Weight: Weight: 79.4 kg Most recent weight: Weight: 79.4 kg       Palliative Assessment/Data:      Patient Active Problem List   Diagnosis Date Noted  . Primary small cell malignant neoplasm of lung, stage 4 ([place])   . Pain due to malignant neoplasm metastatic to bone ([place]) 05/18/2019  . Cervical spinal mass ([place]) 05/17/2019  . Hyponatremia 05/17/2019  . Brain metastasis ([place]) 05/14/2019  . Extensive stage primary small cell carcinoma of lung ([place]) 12/15/2018  . Counseling regarding advance care planning and goals of care 12/15/2018  . Metastatic cancer ([place])   . Bone metastases ([place])   . Liver metastases ([place])   . Hepatocellular carcinoma ([place]) 11/29/2018  . History of alcohol abuse 11/29/2018  . Hypokalemia 11/29/2018    Palliative Care Assessment & Plan   Patient Profile: 63 year old gentleman with extensive stage small cell carcinoma, widespread metastatic disease throughout the liver and bone, concern for thoracic MRI showing spinal canal mass at C7-T1   Recommendations/Plan:  Pain: Reports much better control when he receives regular pain medication.  Plan to increase oxycontin to 20mg  BID.  Continue morphine 4mg  IV prn for breakthrough pain and reassess tomorrow.  Goals of Care and Additional Recommendations:  Limitations on Scope  of Treatment: Full Scope Treatment  Code Status:    Code Status Orders  (From admission, onward)         Start     Ordered   05/17/19 0025  Full code  Continuous     05/17/19 0026        Code Status History    Date Active Date Inactive Code Status Order ID Comments User Context   12/16/2018 2244 12/17/2018 0145 DNR [id number]  Cody Genera, MD Outpatient   11/29/2018 1702 12/02/2018 1819 Full Code [id number]  Shelda Pal, DO ED   Advance Care Planning Activity    Advance Directive Documentation     Most Recent Value  Type of Advance Directive  Healthcare Power of Attorney, Living will  Pre-existing out of facility DNR order (yellow form or pink MOST form)  -  "MOST" Form in Place?  -       Prognosis:   Unable to determine  Discharge Planning:  To Be Determined  Care plan was discussed with patient, RN  Thank you for allowing the Palliative Medicine Team to assist in the care of this patient.   Time In: 1230 Time Out: 1315 Total Time 45 Prolonged Time Billed No      Greater than 50%  of this time was spent counseling and coordinating care related to the above assessment and plan.  Micheline Rough, MD  Please contact Palliative Medicine Team phone at [phone] for questions and concerns.

## 2019-05-20 ENCOUNTER — Ambulatory Visit
Admission: RE | Admit: 2019-05-20 | Discharge: 2019-05-20 | Disposition: A | Discharge status: Home / Self Care | Payer: Medicaid Other | Source: Ambulatory Visit | Attending: Radiation Oncology | Admitting: Radiation Oncology

## 2019-05-20 ENCOUNTER — Inpatient Hospital Stay (HOSPITAL_COMMUNITY): Payer: Medicaid Other

## 2019-05-20 DIAGNOSIS — C7951 Secondary malignant neoplasm of bone: Secondary | ICD-10-CM

## 2019-05-20 DIAGNOSIS — G893 Neoplasm related pain (acute) (chronic): Secondary | ICD-10-CM

## 2019-05-20 DIAGNOSIS — C799 Secondary malignant neoplasm of unspecified site: Secondary | ICD-10-CM

## 2019-05-20 DIAGNOSIS — C3490 Malignant neoplasm of unspecified part of unspecified bronchus or lung: Secondary | ICD-10-CM

## 2019-05-20 LAB — BASIC METABOLIC PANEL
Anion gap: 10 (ref 5–15)
BUN: 12 mg/dL (ref 8–23)
CO2: 25 mmol/L (ref 22–32)
Calcium: 9.5 mg/dL (ref 8.9–10.3)
Chloride: 98 mmol/L (ref 98–111)
Creatinine, Ser: 0.68 mg/dL (ref 0.61–1.24)
GFR calc Af Amer: >60 mL/min (ref >60)
GFR calc non Af Amer: >60 mL/min (ref >60)
Glucose, Bld: 123 mg/dL — ABNORMAL HIGH (ref 70–99)
Potassium: 4.2 mmol/L (ref 3.5–5.1)
Sodium: 133 mmol/L — ABNORMAL LOW (ref 135–145)

## 2019-05-20 MED ORDER — SODIUM CHLORIDE (PF) 0.9 % IJ SOLN
INTRAMUSCULAR | Status: AC
  Administered 2019-05-20: 22:00:00 10 mL
  Filled 2019-05-20: qty 50

## 2019-05-20 MED ORDER — MORPHINE SULFATE (PF) 2 MG/ML IV SOLN
2.0000 mg | Freq: Once | INTRAVENOUS | Status: AC | PRN
  Administered 2019-05-20: 2 mg via INTRAVENOUS
  Filled 2019-05-20: qty 1

## 2019-05-20 MED ORDER — IOHEXOL 300 MG/ML  SOLN
100.0000 mL | Freq: Once | INTRAMUSCULAR | Status: AC | PRN
  Administered 2019-05-20: 22:00:00 100 mL via INTRAVENOUS

## 2019-05-20 NOTE — Consult Note (Addendum)
Radiation Oncology         [phone]) [phone] ________________________________  Name: Cody Montoya        MRN: [id number]  Date of Service: 05/20/2019   DOB: 01-Jul-1956  TO:IZTIWPY, Provider, MD    REFERRING PHYSICIAN: Dr. 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 Diagnoses of Malignant neoplasm of lung, unspecified laterality, unspecified part of lung ([place]), Abnormal MRI, thoracic spine, and Right-sided thoracic back pain, unspecified chronicity were also pertinent to this visit.   HISTORY OF PRESENT ILLNESS: Cody Montoya is a 63 y.o. male seen at the request of Dr. Mickeal Skinner for concerns of progressive disease involving the spine. The patient is well known to [place] with a history of extensive stage small cell carcinoma of the lung. He was originally diagnosed with extensive stage disease after a liver biopsy on 12/01/18 revealed small cell carcinoma consistent with lung primary. He was unable to tolerate MRI brain and a CT head revealed no disease. He did have concerns for bony disease as well in the spine around T10. He began carboplatin/etoposide and completed  4 cycles of this and then transitioned to [place] which he has completed 4 cycles of on 02/19/2019. He continues on Atezolizumab and received this last on 03/30/2019. He has had a nice response to treatment and hence was referred to consider PCI.  He was unsure about proceeding because of the risk of cognitive effects, and we discussed proceeding with an MRI of the brain to determine if he would need PCI versus definitive treatment.  Unfortunately his MRI of the brain On 04/16/2019 did reveal 8 metastatic deposits, and he subsequently went on to proceed with whole brain radiotherapy which she completed on 05/12/2019.  Apparently he began experiencing radiculopathy in the arm and shoulder area, and was seen and evaluated by Dr. Mickeal Skinner in the outpatient setting.  His symptoms were  concerning for thoracic versus cervical disease.  He was encouraged to proceed with an MRI of the spine but was admitted to the hospital with intractable pain on 05/16/2019.  There an MRI of the thoracic spine with and without contrast confirmed the suspicions of metastatic disease between C6, C7, T1, T2 that were progressed from prior in comparison to CT based imaging, as well as disease at T3 and T4-T11 vertebral bodies.  The most extensive was T9.  There was also an 11 mm mass in the spinal canal at the level of C7-T1 concerning for possible intramedullary deposit.  He is not a good surgical candidate, and rather would be better suited for palliative radiotherapy.  He is awaiting a formal CT of the cervical spine as well.  He remains inpatient, and is contacted today to discuss treatment recommendations.     PREVIOUS RADIATION THERAPY: Yes   04/29/2019-05/12/2019: The patient received 30 Gy in 10 fractions to the whole brain     PAST MEDICAL HISTORY:  Past Medical History:  Diagnosis Date   Cancer ([place])        PAST SURGICAL HISTORY: Past Surgical History:  Procedure Laterality Date   NO PAST SURGERIES       FAMILY HISTORY:  Family History  Problem Relation Age of Onset   Breast cancer Sister    Breast cancer Sister      SOCIAL HISTORY:  reports that he has been smoking cigarettes. He has a 45.00 pack-year smoking history. He has never used smokeless tobacco. He reports previous alcohol use of about 8.0 standard drinks  of alcohol per week. He reports previous drug use. Frequency: 7.00 times per week. Drug: Marijuana.  The patient is single and resides in [place]. Cody Montoya and ex-wife often help make medical decisions with the patient.   ALLERGIES: Patient has no known allergies.   MEDICATIONS:  Current Facility-Administered Medications  Medication Dose Route Frequency Provider Last Rate Last Dose   0.9 %  sodium chloride infusion   Intravenous Continuous Hollice Gong,  Mir Mohammed, MD 50 mL/hr at 05/19/19 2000     calcium carbonate (TUMS - dosed in mg elemental calcium) chewable tablet 200 mg of elemental calcium  1 tablet Oral BID PRN Hollace Hayward K, NP   200 mg of elemental calcium at 05/19/19 2111   dexamethasone (DECADRON) injection 8 mg  8 mg Intravenous Claudie Leach, MD   8 mg at 05/20/19 0919   enoxaparin (LOVENOX) injection 40 mg  40 mg Subcutaneous Daily Hollice Gong, Mir Mohammed, MD   40 mg at 05/20/19 2841   morphine 2 MG/ML injection 2-4 mg  2-4 mg Intravenous Q3H PRN Loistine Chance, MD   2 mg at 05/20/19 0926   ondansetron (ZOFRAN) injection 4 mg  4 mg Intravenous Q6H PRN Florencia Reasons, MD   4 mg at 05/17/19 1233   oxyCODONE (Oxy IR/ROXICODONE) immediate release tablet 5-10 mg  5-10 mg Oral Q4H PRN Florencia Reasons, MD   10 mg at 05/19/19 1633   oxyCODONE (OXYCONTIN) 12 hr tablet 20 mg  20 mg Oral Q12H Domingo Cocking, Gene, MD   20 mg at 05/20/19 0916   polyethylene glycol (MIRALAX / GLYCOLAX) packet 17 g  17 g Oral Daily PRN Hollice Gong, Mir Earlie Server, MD       senna-docusate (Senokot-S) tablet 1 tablet  1 tablet Oral BID Florencia Reasons, MD   1 tablet at 05/20/19 3244   traZODone (DESYREL) tablet 50 mg  50 mg Oral QHS PRN Tomma Rakers, MD       Facility-Administered Medications Ordered in Other Encounters  Medication Dose Route Frequency Provider Last Rate Last Dose   oxyCODONE (Oxy IR/ROXICODONE) immediate release tablet 5 mg  5 mg Oral Once Brunetta Genera, MD         REVIEW OF SYSTEMS: On review of systems, the patient reports that he is doing pretty well.  He describes his pain as being persistent in the upper back and , and his anxiety about an MRI to be prohibitive of doing this without general anesthesia.  Denies any chest pain, shortness of breath, cough, fevers, chills, night sweats, unintended weight changes. He denies any bowel or bladder disturbances, and denies abdominal pain, nausea or vomiting. He denies any new musculoskeletal or  joint aches or pains. A complete review of systems is obtained and is otherwise negative.     PHYSICAL EXAM:  Unable to assess due to encounter type.   ECOG = 0  0 - Asymptomatic (Fully active, able to carry on all predisease activities without restriction)  1 - Symptomatic but completely ambulatory (Restricted in physically strenuous activity but ambulatory and able to carry out work of a light or sedentary nature. For example, light housework, office work)  2 - Symptomatic, <50% in bed during the day (Ambulatory and capable of all self care but unable to carry out any work activities. Up and about more than 50% of waking hours)  3 - Symptomatic, >50% in bed, but not bedbound (Capable of only limited self-care, confined to bed or chair 50% or more of waking hours)  4 - Bedbound (Completely disabled. Cannot carry on any self-care. Totally confined to bed or chair)  5 - Death   Eustace Pen MM, Creech RH, Tormey DC, et al. [phone]). "Toxicity and response criteria of the [facility] Group". [place] Oncol. 5 (6): 649-55    LABORATORY DATA:  Lab Results  Component Value Date   WBC 4.8 05/18/2019   HGB 13.5 05/18/2019   HCT 39.8 05/18/2019   MCV 98.0 05/18/2019   PLT 145 (L) 05/18/2019   Lab Results  Component Value Date   NA 133 (L) 05/20/2019   K 4.2 05/20/2019   CL 98 05/20/2019   CO2 25 05/20/2019   Lab Results  Component Value Date   ALT 31 05/18/2019   AST 40 05/18/2019   ALKPHOS 87 05/18/2019   BILITOT 0.9 05/18/2019      RADIOGRAPHY: Dg Ribs Bilateral W/chest  Result Date: 04/20/2019 CLINICAL DATA:  Right-sided chest wall pain. Fell. History of metastatic small cell lung cancer. EXAM: BILATERAL RIBS AND CHEST - 4+ VIEW COMPARISON:  Chest CT 03/03/2019 FINDINGS: The cardiac silhouette, mediastinal and hilar contours are within normal limits and stable. Left upper lobe scarring changes are noted and likely related to prior radiation. Bilateral nipple  shadows but no worrisome pulmonary lesions. Underlying emphysematous changes. There is a left-sided rib fracture involving the seventh anterolateral rib. Seventh and eighth right rib fractures are also noted. No pleural effusions or pneumothorax. IMPRESSION: Bilateral nondisplaced rib fractures. No acute pulmonary findings. Electronically Signed   By: Marijo Sanes M.D.   On: 04/20/2019 12:34   Dg Thoracic Spine 2 View  Result Date: 04/20/2019 CLINICAL DATA:  Back pain.  Fell. EXAM: THORACIC SPINE 2 VIEWS COMPARISON:  Chest CT 03/03/2019 FINDINGS: CT scan 03/03/2019 moderate to advanced lumbar spine degenerative changes for age. No acute fracture or obvious bone lesions. No significant paraspinal findings. IMPRESSION: Normal alignment and no acute bony findings. Electronically Signed   By: Marijo Sanes M.D.   On: 04/20/2019 12:38   Mr Thoracic Spine W Wo Contrast  Result Date: 05/16/2019 CLINICAL DATA:  Worsening radicular pain in the left arm. EXAM: MRI THORACIC WITHOUT AND WITH CONTRAST TECHNIQUE: Multiplanar and multiecho pulse sequences of the thoracic spine were obtained without and with intravenous contrast. CONTRAST:  10 cc Gadavist intravenous COMPARISON:  12/01/2018 FINDINGS: MRI THORACIC SPINE FINDINGS Alignment:  No abnormal alignment. Vertebrae: Osseous metastatic disease which is essentially confluent in the C6, C7, T1, and T2 vertebrae, progressive from prior. Metastases also seen in the T3 spinous process and T4 through T11 vertebral bodies, most extensive in the T9 vertebral body. Cord: 11 mm mass in the spinal canal at the level of C7-T1. This appears intradural on axial postcontrast imaging but there is very limited assessment due to motion artifact. Cord edema is seen extending inferiorly. No contiguous mass is seen in the epidural space at this level. Paraspinal and other soft tissues: Left perihilar mass with mediastinal adenopathy. This is a significant change when compared to chest CT  June 2020 Disc levels: Spondylosis and lower thoracic disc degeneration. No significant degenerative findings. IMPRESSION: 1. 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 Before any treatment suggest enhanced cervical MRI after symptom control. 2. Widespread osseous metastatic disease with progression from March 2020. 3. Thoracic progression from chest CT June 2020. Electronically Signed   By: Neva Seat.D.  On: 05/16/2019 22:37       IMPRESSION/PLAN: 1. Progressive Metastatic Extensive Stage Small Cell Carcinoma of the lung with brain and bone metastases. Dr. Lisbeth Renshaw has reviewed his case and we have discussed the upcoming MRI of the C spine that has been recommended. There has been some confusion about when this will happen per the family and I will reach out to the MRI department to see when this will be performed. We discussed that the prior imaging indicates liklihood for there to be more disease in the C spine which could be contributing to the patient's radiculopathy. His pain is somewhat managed, and I let him know we'd be discussing radiation recommendations once his MRI has resulted. He is in agreement with this plan. His Ex Wife Cody Montoya, his HCPOA was also contacted, and when we have results, we will have a conference call discussion about the findings and recommendations moving forward.   In a visit lasting 70 minutes, greater than 50% of the time was spent by phone and in floor time, coordinating the patient's care.    Carola Rhine, PAC

## 2019-05-20 NOTE — Progress Notes (Signed)
PROGRESS NOTE    [facility]  DPO:[id number] DOB: 21-Apr-1956 DOA: 05/16/2019 PCP: Default, Provider, MD   Brief Narrative:  Patient is admitted due to intractable pain, vomiting and concern for spinal metastasis. He recently completed radiation for brain mets, and has chronic related left sided body numbness but has been having pain in his back for several weeks as well. The pain is a sharp stabbing ache between his shoulderblades and radiates to the sides. He has no associated numbness from this, no extremity weakness, saddle parasthesias or changes in bowel or bladder habits. He came to the ER since he was miserable from the back pain which has been worsening and when the pain gets bad he is unable to keep down anything by mouth.  ED Course:He had labs done which are unremarkable but a motion degraded MRI of the spine shows possible C7/T1 spinal canal mass. ER provider spoke with NSG who feels no intervention needed tonight but recommends admission for contrast MRI of the C spine in 24 hours, followed by their recommendations.  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)   Brain metastasis ([place])   Cervical spinal mass (HCC)   Hyponatremia   Primary small cell malignant neoplasm of lung, stage 4 (HCC)  Intractable Upper back pain , likely from tumor mets MRI of the spine shows possible C7/T1 spinal canal mass Neurosurgery consulted in the ED who recommended c spine MRI however, patient is not able to tolerate due to pain - unfortunately, pt unable to get MRI with anesthesia today.  Will plan to get CT scans today if possible and discuss again with anesthesia tomorrow (though per anesthesia, no availability tomorrow either) - Appreciate neuro oncology recs - appreciate  palliative care assistance with pain management - appreciate Dr. Tomasa Blase - MRI C and T spine pending, follow CT chest, abdomen, pelvis, neck   N/V; chronic   reports positional and think it related to pain Prn antiemetics Started on steroids for pain control, hopefully will held n/v as well  Hyponatremia:  Sodium 128 on presentation Likely from dehydration, improved with hydration  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, spine and liver mets -diagnosed from 12/2018 Received chemotherapy in 01/2019 -finished xrt to brain  -currently on immunotherapy  DVT prophylaxis: lovenox  Code Status: full  Family Communication: none at bedside Disposition Plan: pending   Consultants:   Palliative care  Neuro oncology  Oncology  Neurosurgery  Rad onc  Procedures:   none  Antimicrobials:  Anti-infectives (From admission, onward)   None         Subjective: C/o back pain, neck pain.   Frustrated with getting meds on time.  If on time he does ok.  If not on time, he feels worse.  Objective: Vitals:   05/19/19 0413 05/19/19 1358 05/19/19 2043 05/20/19 0458  BP: (!) 155/77 138/69 (!) 169/87 (!) 153/73  Pulse: (!) 51 68 (!) 55 (!) 50  Resp: 17 16 19 18   Temp: 98 F (36.7 C) 98 F (36.7 C) 98.5 F (36.9 C) 98.4 F (36.9 C)  TempSrc: Oral Oral Oral Oral  SpO2: 97% 98% 98% 94%  Weight:      Height:        Intake/Output Summary (Last 24  hours) at 05/20/2019 1651 Last data filed at 05/19/2019 2000 Gross per 24 hour  Intake 3032.37 ml  Output 375 ml  Net 2657.37 ml   Filed Weights   05/16/19 1908 05/17/19 0152  Weight: 79.4 kg 79.4 kg    Examination:  General exam: Appears calm and comfortable  Respiratory system: Clear to auscultation. Respiratory effort normal. Cardiovascular system: S1 & S2 heard, RRR.  Gastrointestinal system: Abdomen is nondistended, soft and nontender. Central nervous system: Alert and oriented. Moving all extremities.  Extremities: no LEE Skin: No rashes, lesions or ulcers Psychiatry: Judgement and insight appear normal. Mood & affect appropriate.     Data Reviewed: I have personally reviewed following labs and imaging studies  CBC: Recent Labs  Lab 05/16/19 1910 05/18/19 0704  WBC 6.6 4.8  NEUTROABS  --  3.2  HGB 14.5 13.5  HCT 42.5 39.8  MCV 96.6 98.0  PLT 192 540*   Basic Metabolic Panel: Recent Labs  Lab 05/16/19 1910 05/17/19 0848 05/18/19 0704 05/19/19 0602 05/20/19 0550  NA 128* 134* 134* 132* 133*  K 3.9 4.1 5.4* 4.5 4.2  CL 93* 99 100 101 98  CO2 24 23 24 25 25   GLUCOSE 93 104* 88 135* 123*  BUN 16 14 13 12 12   CREATININE 0.65 0.57* 0.56* 0.51* 0.68  CALCIUM 8.7* 8.7* 8.7* 8.8* 9.5  MG  --   --  2.1  --   --    GFR: Estimated Creatinine Clearance: 98.9 mL/min (by C-G formula based on SCr of 0.68 mg/dL). Liver Function Tests: Recent Labs  Lab 05/16/19 1910 05/18/19 0704  AST 49* 40  ALT 35 31  ALKPHOS 96 87  BILITOT 1.3* 0.9  PROT 8.6* 7.2  ALBUMIN 4.5 3.8   Recent Labs  Lab 05/16/19 1910  LIPASE 52*   No results for input(s): AMMONIA in the last 168 hours. Coagulation Profile: No results for input(s): INR, PROTIME in the last 168 hours. Cardiac Enzymes: No results for input(s): CKTOTAL, CKMB, CKMBINDEX, TROPONINI in the last 168 hours. BNP (last 3 results) No results for input(s): PROBNP in the last 8760 hours. HbA1C: No results for input(s): HGBA1C in the last 72 hours. CBG: No results for input(s): GLUCAP in the last 168 hours. Lipid Profile: No results for input(s): CHOL, HDL, LDLCALC, TRIG, CHOLHDL, LDLDIRECT in the last 72 hours. Thyroid Function Tests: No results for input(s): TSH, T4TOTAL, FREET4, T3FREE, THYROIDAB in the last 72 hours. Anemia Panel: No results for input(s): VITAMINB12, FOLATE, FERRITIN, TIBC, IRON, RETICCTPCT in the last 72 hours. Sepsis Labs: No results for input(s): PROCALCITON, LATICACIDVEN in the last 168 hours.   Recent Results (from the past 240 hour(s))  SARS CORONAVIRUS 2 Nasal Swab Aptima Multi Swab     Status: None   Collection Time: 05/17/19  1:26 AM   Specimen: Aptima Multi Swab; Nasal Swab  Result Value Ref Range Status   SARS Coronavirus 2 NEGATIVE NEGATIVE Final    Comment: (NOTE) SARS-CoV-2 target nucleic acids are NOT DETECTED. The SARS-CoV-2 RNA is generally detectable in upper and lower respiratory specimens during the acute phase of infection. Negative results do not preclude SARS-CoV-2 infection, do not rule out co-infections with other pathogens, and should not be used as the sole basis for treatment or other patient management decisions. Negative results must be combined with clinical observations, patient history, and epidemiological information. The expected result is Negative. Fact Sheet for Patients: SugarRoll.be Fact Sheet for Healthcare Providers: https://www.woods-mathews.com/ This test is not yet  approved or cleared by the [place] and  has been authorized for detection and/or diagnosis of SARS-CoV-2 by FDA under an Emergency Use Authorization (EUA). This EUA will remain  in effect (meaning this test can be used) for the duration of the COVID-19 declaration under Section 56 4(b)(1) of the Act, 21 U.S.C. section 360bbb-3(b)(1), unless the authorization is terminated or revoked sooner. Performed at [facility], [place] [address], [place], [place] [address]          Radiology Studies: No results found.      Scheduled Meds: . dexamethasone (DECADRON) injection  8 mg Intravenous Q12H  . enoxaparin (LOVENOX) injection  40 mg Subcutaneous Daily  . oxyCODONE  20 mg Oral Q12H  . senna-docusate  1 tablet Oral BID   Continuous Infusions: . sodium chloride 50 mL/hr at 05/19/19 2000     LOS: 3 days    Time spent: over 69 min    Fayrene Helper, MD [facility] Pager AMION  If 7PM-7AM,  please contact night-coverage [email or web address] Password [facility] 05/20/2019, 4:51 PM

## 2019-05-20 NOTE — Progress Notes (Signed)
Daily Progress Note   Patient Name: Cody Montoya       Date: 05/20/2019 DOB: 23-May-1956  Age: 63 y.o. MRN#: [id number] Attending Physician: Elodia Florence., * Primary Care Physician: Default, Provider, MD Admit Date: 05/16/2019  Reason for Consultation/Follow-up: Establishing goals of care and Pain control  Subjective: I met with Cody Montoya today.  We discussed his pain regimen and he reports that he has been doing better overall on current regimen.  He has had oxycontin 56m BID as well as continuing moprhin3 485mIV prn with 24 mg in the last 24 hours.  His preference is to not change further at this point as he feels regimen is working.  Hopeful he can get MRI today as plan is for further imaging and then discussion with radiation oncology/medical oncology regarding recommendations for next steps.  Length of Stay: 3  Current Medications: Scheduled Meds:  . dexamethasone (DECADRON) injection  8 mg Intravenous Q12H  . enoxaparin (LOVENOX) injection  40 mg Subcutaneous Daily  . oxyCODONE  20 mg Oral Q12H  . senna-docusate  1 tablet Oral BID    Continuous Infusions: . sodium chloride 50 mL/hr at 05/19/19 2000    PRN Meds: calcium carbonate, morphine injection, ondansetron (ZOFRAN) IV, oxyCODONE, polyethylene glycol, traZODone  Physical Exam         General: Alert, awake, in no acute distress.  HEENT: No bruits, no goiter, no JVD Heart: Regular rate and rhythm. No murmur appreciated. Lungs: Good air movement, clear Abdomen: Soft, nontender, nondistended, positive bowel sounds.  Ext: No significant edema Skin: Warm and dry Neuro: Grossly intact, nonfocal.  Vital Signs: BP (!) 153/73 (BP Location: Right Arm)   Pulse (!) 50   Temp 98.4 F (36.9 C) (Oral)    Resp 18   Ht 5' 10"  (1.778 m)   Wt 79.4 kg   SpO2 94%   BMI 25.11 kg/m  SpO2: SpO2: 94 % O2 Device: O2 Device: Room Air O2 Flow Rate:    Intake/output summary:   Intake/Output Summary (Last 24 hours) at 05/20/2019 1542 Last data filed at 05/19/2019 2000 Gross per 24 hour  Intake 3032.37 ml  Output 375 ml  Net 2657.37 ml   LBM: Last BM Date: 05/16/19 Baseline Weight: Weight: 79.4 kg Most recent weight: Weight: 79.4 kg  Palliative Assessment/Data:      Patient Active Problem List   Diagnosis Date Noted  . Primary small cell malignant neoplasm of lung, stage 4 ([place])   . Pain due to malignant neoplasm metastatic to bone ([place]) 05/18/2019  . Cervical spinal mass ([place]) 05/17/2019  . Hyponatremia 05/17/2019  . Brain metastasis ([place]) 05/14/2019  . Extensive stage primary small cell carcinoma of lung ([place]) 12/15/2018  . Counseling regarding advance care planning and goals of care 12/15/2018  . Metastatic cancer ([place])   . Bone metastases ([place])   . Liver metastases ([place])   . Hepatocellular carcinoma ([place]) 11/29/2018  . History of alcohol abuse 11/29/2018  . Hypokalemia 11/29/2018    Palliative Care Assessment & Plan   Patient Profile: 63 year old gentleman with extensive stage small cell carcinoma, widespread metastatic disease throughout the liver and bone, concern for thoracic MRI showing spinal canal mass at C7-T1   Recommendations/Plan: Pain: Reports much better control when he receives regular pain medication.  Continue oxycontin 45m BID with morphine 49mIV prn for breakthrough pain.  Goals of Care and Additional Recommendations: ? Limitations on Scope of Treatment: Full Scope Treatment  Code Status:    Code Status Orders  (From admission, onward)         Start     Ordered   05/17/19 0025  Full code  Continuous     05/17/19 0026        Code Status History    Date Active Date Inactive Code Status Order ID Comments User Context   12/16/2018 2244  12/17/2018 0145 DNR [id number] GeneraMD Outpatient   11/29/2018 1702 12/02/2018 1819 Full Code [id number] PalDO ED   Advance Care Planning Activity    Advance Directive Documentation     Most Recent Value  Type of Advance Directive  Healthcare Power of Attorney, Living will  Pre-existing out of facility DNR order (yellow form or pink MOST form)  -  "MOST" Form in Place?  -       Prognosis:   Unable to determine  Discharge Planning:  To Be Determined  Care plan was discussed with patient, Dr. PoFlorene GlenThank you for allowing the Palliative Medicine Team to assist in the care of this patient.   Time In: 1025 Time Out: 1055 Total Time 30 Prolonged Time Billed No      Greater than 50%  of this time was spent counseling and coordinating care related to the above assessment and plan.  GeMicheline RoughMD  Please contact Palliative Medicine Team phone at [phone] questions and concerns.

## 2019-05-21 DIAGNOSIS — C3490 Malignant neoplasm of unspecified part of unspecified bronchus or lung: Secondary | ICD-10-CM

## 2019-05-21 LAB — COMPREHENSIVE METABOLIC PANEL
ALT: 41 U/L (ref 0–44)
AST: 37 U/L (ref 15–41)
Albumin: 3.7 g/dL (ref 3.5–5.0)
Alkaline Phosphatase: 75 U/L (ref 38–126)
Anion gap: 9 (ref 5–15)
BUN: 13 mg/dL (ref 8–23)
CO2: 25 mmol/L (ref 22–32)
Calcium: 9 mg/dL (ref 8.9–10.3)
Chloride: 98 mmol/L (ref 98–111)
Creatinine, Ser: 0.45 mg/dL — ABNORMAL LOW (ref 0.61–1.24)
GFR calc Af Amer: >60 mL/min (ref >60)
GFR calc non Af Amer: >60 mL/min (ref >60)
Glucose, Bld: 112 mg/dL — ABNORMAL HIGH (ref 70–99)
Potassium: 4.1 mmol/L (ref 3.5–5.1)
Sodium: 132 mmol/L — ABNORMAL LOW (ref 135–145)
Total Bilirubin: 0.9 mg/dL (ref 0.3–1.2)
Total Protein: 7.1 g/dL (ref 6.5–8.1)

## 2019-05-21 LAB — CBC
HCT: 41.1 % (ref 39.0–52.0)
Hemoglobin: 13.9 g/dL (ref 13.0–17.0)
MCH: 32.2 pg (ref 26.0–34.0)
MCHC: 33.8 g/dL (ref 30.0–36.0)
MCV: 95.1 fL (ref 80.0–100.0)
Platelets: 171 10*3/uL (ref 150–400)
RBC: 4.32 MIL/uL (ref 4.22–5.81)
RDW: 11.9 % (ref 11.5–15.5)
WBC: 6.4 10*3/uL (ref 4.0–10.5)
nRBC: 0 % (ref 0.0–0.2)

## 2019-05-21 LAB — GLUCOSE, CAPILLARY: Glucose-Capillary: 97 mg/dL (ref 70–99)

## 2019-05-21 LAB — MAGNESIUM: Magnesium: 1.8 mg/dL (ref 1.7–2.4)

## 2019-05-21 NOTE — Progress Notes (Signed)
I spoke with the patient and his ex-wife Katharine Look to discuss the plan for tomorrow's MRI of T and C spine, and that we would likely be moving forward with palliative radiotherapy to several areas in that region. We discussed risks, benefits, short, and long term effects of therapy and he will come for simulation tomorrow afternoon with plans to begin radiotherapy early next week.     Carola Rhine, PAC

## 2019-05-21 NOTE — Anesthesia Preprocedure Evaluation (Addendum)
Anesthesia Evaluation  Patient identified by MRN, date of birth, ID band Patient awake    Reviewed: Allergy & Precautions, H&P , NPO status , Patient's Chart, lab work & pertinent test results  Airway Mallampati: I  TM Distance: >3 FB Neck ROM: Full    Dental no notable dental hx. (+) Teeth Intact, Dental Advisory Given   Pulmonary neg pulmonary ROS, Current Smoker,    Pulmonary exam normal breath sounds clear to auscultation       Cardiovascular Exercise Tolerance: Good negative cardio ROS Normal cardiovascular exam Rhythm:Regular Rate:Normal     Neuro/Psych negative neurological ROS  negative psych ROS   GI/Hepatic negative GI ROS, Neg liver ROS,   Endo/Other  negative endocrine ROS  Renal/GU negative Renal ROS  negative genitourinary   Musculoskeletal negative musculoskeletal ROS (+)   Abdominal   Peds negative pediatric ROS (+)  Hematology negative hematology ROS (+)   Anesthesia Other Findings Present on Admission: . Hepatocellular carcinoma  . History of alcohol abuse . Metastatic cancer  . Bone metastases  . Liver metastases  . Brain metastasis Hyponatremia  Reproductive/Obstetrics negative OB ROS                           Anesthesia Physical Anesthesia Plan  ASA: IV  Anesthesia Plan: General   Post-op Pain Management:    Induction: Intravenous  PONV Risk Score and Plan: 2 and Ondansetron  Airway Management Planned: Oral ETT and LMA  Additional Equipment:   Intra-op Plan:   Post-operative Plan: Extubation in OR  Informed Consent: I have reviewed the patients History and Physical, chart, labs and discussed the procedure including the risks, benefits and alternatives for the proposed anesthesia with the patient or authorized representative who has indicated his/her understanding and acceptance.       Plan Discussed with: Anesthesiologist and CRNA  Anesthesia  Plan Comments: (    )       Anesthesia Quick Evaluation

## 2019-05-21 NOTE — Progress Notes (Addendum)
HEMATOLOGY-ONCOLOGY PROGRESS NOTE  SUBJECTIVE: Still having pain to his neck.  Reports that pain medication is helping.  MRI is scheduled for tomorrow. Denies chest pain, shortness of breath, nausea, vomiting. Had repeat CT of the neck, chest, abdomen, and pelvis yesterday.   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[place])   12/15/2018 -  Chemotherapy   The patient had palonosetron (ALOXI) injection 0.25 mg, 0.25 mg, Intravenous,  Once, 4 of 4 cycles Administration: 0.25 mg (12/15/2018), 0.25 mg (01/05/2019), 0.25 mg (01/26/2019), 0.25 mg (02/17/2019) pegfilgrastim (NEULASTA ONPRO KIT) injection 6 mg, 6 mg, Subcutaneous, Once, 3 of 3 cycles Administration: 6 mg (01/07/2019), 6 mg (01/28/2019), 6 mg (02/19/2019) pegfilgrastim-cbqv (UDENYCA) injection 6 mg, 6 mg, Subcutaneous, Once, 1 of 1 cycle Administration: 6 mg (12/16/2018) CARBOplatin (PARAPLATIN) 340 mg in sodium chloride 0.9 % 250 mL chemo infusion, 340 mg (100 % of original dose 335.4 mg), Intravenous,  Once, 4 of 4 cycles Dose modification: 335.4 mg (original dose 335.4 mg, Cycle 1),   (original dose 590.4 mg, Cycle 2), 700 mg (original dose 590.4 mg, Cycle 4, Reason: Provider Judgment) Administration: 340 mg (12/15/2018), 550 mg (01/05/2019), 550 mg (01/26/2019), 700 mg (02/17/2019) etoposide (VEPESID) 160 mg in sodium chloride 0.9 % 500 mL chemo infusion, 75 mg/m2 = 160 mg (100 % of original dose 75 mg/m2), Intravenous,  Once, 4 of 4 cycles Dose modification: 75 mg/m2 (original dose 75 mg/m2, Cycle 1, Reason: Provider Judgment), 50 mg/m2 (original dose 50 mg/m2, Cycle 2, Reason: Change in LFTs) Administration: 160 mg (12/15/2018), 110 mg (01/05/2019), 110 mg (01/07/2019), 110 mg (01/26/2019), 110 mg (01/28/2019), 110 mg (02/17/2019), 110 mg (02/19/2019), 110 mg (01/06/2019), 110 mg (01/27/2019), 110 mg (02/18/2019) fosaprepitant (EMEND) 150 mg,  dexamethasone (DECADRON) 12 mg in sodium chloride 0.9 % 145 mL IVPB, , Intravenous,  Once, 4 of 4 cycles Administration:  (12/15/2018),  (01/05/2019),  (01/26/2019),  (02/17/2019) atezolizumab (TECENTRIQ) 1,200 mg in sodium chloride 0.9 % 250 mL chemo infusion, 1,200 mg, Intravenous, Once, 7 of 8 cycles Administration: 1,200 mg (01/05/2019), 1,200 mg (01/26/2019), 1,200 mg (02/17/2019), 1,200 mg (03/09/2019), 1,200 mg (03/30/2019), 1,200 mg (04/20/2019), 1,200 mg (05/11/2019)  for chemotherapy treatment.       REVIEW OF SYSTEMS:   A comprehensive 14 point review of systems was negative except as noted in the HPI.  I have reviewed the past medical history, past surgical history, social history and family history with the patient and they are unchanged from previous note.   PHYSICAL EXAMINATION: ECOG PERFORMANCE STATUS: 2 - Symptomatic, <50% confined to bed  Vitals:   05/20/19 2058 05/21/19 0440  BP: (!) 152/82 (!) 151/77  Pulse: (!) 52 (!) 57  Resp: 16 16  Temp: 98.4 F (36.9 C) 98 F (36.7 C)  SpO2: 97% 92%   Filed Weights   05/16/19 1908 05/17/19 0152  Weight: 175 lb (79.4 kg) 175 lb (79.4 kg)    Intake/Output from previous day: 08/26 0701 - 08/27 0700 In: 1095.9 [I.V.:1095.9] Out: -   GENERAL:alert, no distress and comfortable SKIN: skin color, texture, turgor are normal, no rashes or significant lesions EYES: normal, Conjunctiva are pink and non-injected, sclera clear OROPHARYNX:no exudate, no erythema and lips, buccal mucosa, and tongue normal  NECK: supple, thyroid normal size, non-tender, without nodularity LYMPH:  no palpable lymphadenopathy in the cervical, axillary or inguinal LUNGS: clear to auscultation and percussion with normal  breathing effort HEART: regular rate & rhythm and no murmurs and no lower extremity edema ABDOMEN:abdomen soft, non-tender and normal bowel sounds Musculoskeletal:no cyanosis of digits and no clubbing  NEURO: alert & oriented x 3 with fluent  speech, no focal motor/sensory deficits  LABORATORY DATA:  I have reviewed the data as listed CMP Latest Ref Rng & Units 05/21/2019 05/20/2019 05/19/2019  Glucose 70 - 99 mg/dL 112(H) 123(H) 135(H)  BUN 8 - 23 mg/dL _0 Creatinine 0.61 - 1.24 mg/dL 0.45(L) 0.68 0.51(L)  Sodium 135 - 145 mmol/L 132(L) 133(L) 132(L)  Potassium 3.5 - 5.1 mmol/L 4.1 4.2 4.5  Chloride 98 - 111 mmol/L 98 98 101  CO2 22 - 32 mmol/L _1 Calcium 8.9 - 10.3 mg/dL 9.0 9.5 8.8(L)  Total Protein 6.5 - 8.1 g/dL 7.1 - -  Total Bilirubin 0.3 - 1.2 mg/dL 0.9 - -  Alkaline Phos 38 - 126 U/L 75 - -  AST 15 - 41 U/L 37 - -  ALT 0 - 44 U/L 41 - -    Lab Results  Component Value Date   WBC 6.4 05/21/2019   HGB 13.9 05/21/2019   HCT 41.1 05/21/2019   MCV 95.1 05/21/2019   PLT 171 05/21/2019   NEUTROABS 3.2 05/18/2019    Ct Soft Tissue Neck W Contrast  Result Date: 05/20/2019 CLINICAL DATA:  Metastatic small cell carcinoma of the lung. Known brain and spinal metastases. Undergoing radiation therapy. EXAM: CT NECK WITH CONTRAST TECHNIQUE: Multidetector CT imaging of the neck was performed using the standard protocol following the bolus administration of intravenous contrast. CONTRAST:  161m OMNIPAQUE IOHEXOL 300 MG/ML  SOLN COMPARISON:  None. FINDINGS: PHARYNX AND LARYNX: --Nasopharynx: Fossae of Rosenmuller are clear. Normal adenoid tonsils for age. --Oral cavity and oropharynx: The palatine and lingual tonsils are normal. The visible oral cavity and floor of mouth are normal. --Hypopharynx: Normal vallecula and pyriform sinuses. --Larynx: Normal epiglottis and pre-epiglottic space. Normal aryepiglottic and vocal folds. --Retropharyngeal space: No abscess, effusion or lymphadenopathy. SALIVARY GLANDS: --Parotid: No mass lesion or inflammation. No sialolithiasis or ductal dilatation. --Submandibular: Symmetric without inflammation. No sialolithiasis or ductal dilatation. --Sublingual: Normal. No ranula or other  visible lesion of the base of tongue and floor of mouth. THYROID: Normal. LYMPH NODES: No enlarged or abnormal density cervical lymph nodes. VASCULAR: Major cervical vessels are patent. LIMITED INTRACRANIAL: Normal. VISUALIZED ORBITS: Normal. MASTOIDS AND VISUALIZED PARANASAL SINUSES: No fluid levels or advanced mucosal thickening. No mastoid effusion. SKELETON: Metastatic lesions are again demonstrated within the vertebral bodies at C6 and C7. No pathologic fracture. There are multiple upper thoracic metastases as well. There are age-indeterminate fractures of the spinous processes at C6 and C7. UPPER CHEST: Mediastinal lymphadenopathy and left suprahilar lung mass are better characterized on the concomitant CT of the chest. OTHER: Low-attenuation subcutaneous left submental lesion is likely a sebaceous cyst. IMPRESSION: 1. No soft tissue mass or lymphadenopathy of the neck. 2. Multiple osseous metastatic lesions of the cervical and thoracic spine, better characterized on the recent thoracic spine MRI. 3. Age-indeterminate fractures of the spinous processes at C6 and C7. 4. Mediastinal lymphadenopathy and left suprahilar lung mass are better characterized on the concomitant CT of the chest. Electronically Signed   By: KUlyses JarredM.D.   On: 05/20/2019 23:37   Ct Chest W Contrast  Result Date: 05/20/2019 CLINICAL DATA:  Metastatic small cell carcinoma EXAM: CT CHEST, ABDOMEN, AND PELVIS WITH CONTRAST TECHNIQUE: Multidetector CT imaging of  the chest, abdomen and pelvis was performed following the standard protocol during bolus administration of intravenous contrast. CONTRAST:  171m OMNIPAQUE IOHEXOL 300 MG/ML  SOLN COMPARISON:  MRI 05/16/2019, [id number], CT chest abdomen pelvis 03/03/2019, 12/01/2018 FINDINGS: CT CHEST FINDINGS Cardiovascular: Nonaneurysmal aorta. Mild aortic atherosclerosis. Coronary vascular calcification. Normal heart size. Small pericardial effusion. Tumor in case mint of left upper lobe  pulmonary arterial vessels which appears significantly narrowed. Mediastinum/Nodes: Incompletely visualized left supraclavicular nodes. Progression of mediastinal adenopathy, now with multiple bulky enlarged lymph nodes. Right upper paratracheal lymph node measures 17 mm, not previously measured. Right paratracheal lymph node, series 2, image number 20 measures 25 mm. Substantially increased AP window node, measuring 23 mm, compared with 13 mm previously. Multiple additional paratracheal and precarinal nodes. Esophagus within normal limits. Lungs/Pleura: Irregular left upper lobe lung mass, significant progression compared to prior exam, contiguous with bulky left hilar/suprahilar mass. This measures approximately 5.6 cm AP by 4.6 transverse by 5 cm craniocaudad though precise measurements difficult as the mass is contiguous with mediastinal adenopathy. Interim development of small left-sided pleural effusion. 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 Musculoskeletal: Known widespread skeletal metastatic disease. Partially visualized sclerosis at C7 with possible pathologic fracture at the junction of the lamina with the spinous process. Sclerotic metastatic lesion at right T2 vertebral body and posterior elements with pathologic superior endplate fracture at T2. Metastatic lesions at T9, T10 and T11. New soft tissue mass surrounding the right ninth posterior rib, consistent with metastatic disease. CT ABDOMEN PELVIS FINDINGS Hepatobiliary: Innumerable hypodense metastatic lesions within the liver, progressed compared to prior. A dominant left lobe lesion measures 7.2 cm by 5.7 cm, and may correspond to a previously measured lesion in the region which measured 3 x 18 mm. The gallbladder is contracted. No calcified stone. No biliary dilatation. Pancreas: Unremarkable. No pancreatic ductal dilatation or surrounding inflammatory changes. Spleen:  Normal in size without focal abnormality. Adrenals/Urinary Tract: Progressed nodular thickening of the left greater than right adrenal glands, also concerning for progression of disease. Kidneys show no hydronephrosis. Subcentimeter hypodensity in the mid right kidney, too small to further characterize. The bladder is normal Stomach/Bowel: The stomach is nonenlarged. No dilated small bowel. No colon wall thickening. Vascular/Lymphatic: Moderate aortic atherosclerosis without aneurysm. Numerous enlarged porta hepatis, peripancreatic, portal caval and aortocaval nodes. Enlarged Peri aortic lymph nodes, measuring up to 2 cm, all progressed compared to prior. Reproductive: Prostate is unremarkable. Other: Negative for free air or free fluid. Small within the inguinal canals. Musculoskeletal: Stable sclerotic focus superior endplate L5. Stable faint sclerosis in the left iliac bone, adjacent lytic lesion is evident. IMPRESSION: 1. Large irregular left upper lobe mass, extending into the left hilar and suprahilar regions, consistent with disease recurrence. The mass invades the mediastinum and there is encasement of the left pulmonary arterial vessels by the soft tissue mass. 2. Significant progression of metastatic disease as evidence by worsened mediastinal adenopathy, hepatic metastatic disease, and upper abdominal and retroperitoneal adenopathy. Increased nodularity of the left greater than right adrenal glands, also concerning for progression of metastatic disease. 3. New small left pleural effusion, with interim finding of multiple left pleural based nodules, consistent with pleural metastatic disease. 4. Skeletal metastatic disease with spinal lesions better seen on MRI. Pathologic superior endplate fracture at T2. New soft tissue mass surrounding the right posterior ninth rib, also consistent with metastatic disease. Electronically Signed   By: KMadie RenoD.  On: 05/20/2019 23:52   Mr Thoracic Spine W Wo  Contrast  Result Date: 05/16/2019 CLINICAL DATA:  Worsening radicular pain in the left arm. EXAM: MRI THORACIC WITHOUT AND WITH CONTRAST TECHNIQUE: Multiplanar and multiecho pulse sequences of the thoracic spine were obtained without and with intravenous contrast. CONTRAST:  10 cc Gadavist intravenous COMPARISON:  12/01/2018 FINDINGS: MRI THORACIC SPINE FINDINGS Alignment:  No abnormal alignment. Vertebrae: Osseous metastatic disease which is essentially confluent in the C6, C7, T1, and T2 vertebrae, progressive from prior. Metastases also seen in the T3 spinous process and T4 through T11 vertebral bodies, most extensive in the T9 vertebral body. Cord: 11 mm mass in the spinal canal at the level of C7-T1. This appears intradural on axial postcontrast imaging but there is very limited assessment due to motion artifact. Cord edema is seen extending inferiorly. No contiguous mass is seen in the epidural space at this level. Paraspinal and other soft tissues: Left perihilar mass with mediastinal adenopathy. This is a significant change when compared to chest CT June 2020 Disc levels: Spondylosis and lower thoracic disc degeneration. No significant degenerative findings. IMPRESSION: 1. 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 Before any treatment suggest enhanced cervical MRI after symptom control. 2. Widespread osseous metastatic disease with progression from March 2020. 3. Thoracic progression from chest CT June 2020. Electronically Signed   By: Monte Fantasia M.D.   On: 05/16/2019 22:37   Ct Abdomen Pelvis W Contrast  Result Date: 05/20/2019 CLINICAL DATA:  Metastatic small cell carcinoma EXAM: CT CHEST, ABDOMEN, AND PELVIS WITH CONTRAST TECHNIQUE: Multidetector CT imaging of the chest, abdomen and pelvis was performed following the standard protocol during bolus administration of  intravenous contrast. CONTRAST:  170m OMNIPAQUE IOHEXOL 300 MG/ML  SOLN COMPARISON:  MRI 05/16/2019, [id number], CT chest abdomen pelvis 03/03/2019, 12/01/2018 FINDINGS: CT CHEST FINDINGS Cardiovascular: Nonaneurysmal aorta. Mild aortic atherosclerosis. Coronary vascular calcification. Normal heart size. Small pericardial effusion. Tumor in case mint of left upper lobe pulmonary arterial vessels which appears significantly narrowed. Mediastinum/Nodes: Incompletely visualized left supraclavicular nodes. Progression of mediastinal adenopathy, now with multiple bulky enlarged lymph nodes. Right upper paratracheal lymph node measures 17 mm, not previously measured. Right paratracheal lymph node, series 2, image number 20 measures 25 mm. Substantially increased AP window node, measuring 23 mm, compared with 13 mm previously. Multiple additional paratracheal and precarinal nodes. Esophagus within normal limits. Lungs/Pleura: Irregular left upper lobe lung mass, significant progression compared to prior exam, contiguous with bulky left hilar/suprahilar mass. This measures approximately 5.6 cm AP by 4.6 transverse by 5 cm craniocaudad though precise measurements difficult as the mass is contiguous with mediastinal adenopathy. Interim development of small left-sided pleural effusion. 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 Musculoskeletal: Known widespread skeletal metastatic disease. Partially visualized sclerosis at C7 with possible pathologic fracture at the junction of the lamina with the spinous process. Sclerotic metastatic lesion at right T2 vertebral body and posterior elements with pathologic superior endplate fracture at T2. Metastatic lesions at T9, T10 and T11. New soft tissue mass surrounding the right ninth posterior rib, consistent with metastatic disease. CT ABDOMEN PELVIS FINDINGS Hepatobiliary: Innumerable hypodense metastatic lesions  within the liver, progressed compared to prior. A dominant left lobe lesion measures 7.2 cm by 5.7 cm, and may correspond to a previously measured lesion in the region which  measured 3 x 18 mm. The gallbladder is contracted. No calcified stone. No biliary dilatation. Pancreas: Unremarkable. No pancreatic ductal dilatation or surrounding inflammatory changes. Spleen: Normal in size without focal abnormality. Adrenals/Urinary Tract: Progressed nodular thickening of the left greater than right adrenal glands, also concerning for progression of disease. Kidneys show no hydronephrosis. Subcentimeter hypodensity in the mid right kidney, too small to further characterize. The bladder is normal Stomach/Bowel: The stomach is nonenlarged. No dilated small bowel. No colon wall thickening. Vascular/Lymphatic: Moderate aortic atherosclerosis without aneurysm. Numerous enlarged porta hepatis, peripancreatic, portal caval and aortocaval nodes. Enlarged Peri aortic lymph nodes, measuring up to 2 cm, all progressed compared to prior. Reproductive: Prostate is unremarkable. Other: Negative for free air or free fluid. Small within the inguinal canals. Musculoskeletal: Stable sclerotic focus superior endplate L5. Stable faint sclerosis in the left iliac bone, adjacent lytic lesion is evident. IMPRESSION: 1. Large irregular left upper lobe mass, extending into the left hilar and suprahilar regions, consistent with disease recurrence. The mass invades the mediastinum and there is encasement of the left pulmonary arterial vessels by the soft tissue mass. 2. Significant progression of metastatic disease as evidence by worsened mediastinal adenopathy, hepatic metastatic disease, and upper abdominal and retroperitoneal adenopathy. Increased nodularity of the left greater than right adrenal glands, also concerning for progression of metastatic disease. 3. New small left pleural effusion, with interim finding of multiple left pleural based  nodules, consistent with pleural metastatic disease. 4. Skeletal metastatic disease with spinal lesions better seen on MRI. Pathologic superior endplate fracture at T2. New soft tissue mass surrounding the right posterior ninth rib, also consistent with metastatic disease. Electronically Signed   By: Donavan Foil M.D.   On: 05/20/2019 23:52    ASSESSMENT AND PLAN: 63 y.o. male with  1. 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 2. Mild chronic small vessel ischemic disease."   2.  Abnormal liver function tests.  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  Bump in bilirubin could be from progression of his liver mets.   12/18/18 US Abdomen revealed Widespread metastatic disease throughout the liver. The liver has a somewhat nodular contour suggesting underlying cirrhosis. 2. No gallstones seen. Gallbladder wall appears mildly thickened and edematous. Sludge is noted in the gallbladder. There is also prominence of the common bile duct without mass or calculus evident. These findings raise concern for potential degree of acalculus cholecystitis. In this regard, it may be prudent to consider nuclear medicine hepatobiliary imaging study to assess for cystic duct patency.  01/02/19 NM Hepatobiliary including GB revealed No biliary obstruction. 2. Normal hepatobiliary scan.  S/p 4 cycles of Carboplatin AUC of 5, 75m/m2 Etoposide, and Atezolizumab at this time.  03/03/19 CT C/A/P revealed "Substantial partial treatment response as detailed below. No new or progressive metastatic disease. 2. Central left upper lobe lung mass is substantially decreased in size. 3. Mediastinal adenopathy is substantially decreased, with residual mild AP window adenopathy. Retroperitoneal adenopathy has resolved. 4. Liver metastases are decreased. 5. Stable faintly  sclerotic osseous lesions in the spine and left iliac wing. 6. Stable irregular thickening of the adrenal glands without discrete adrenal nodules. 7. Aortic Atherosclerosis."  Previously discussed the NCCN guidelines of 4-6 cycles of treatment, followed by maintenance immunotherapy. Pt's disease has responded well, and discussed risks vs benefits of completing 1-2 additional cycles (pt has completed 4 cycles thus far),  in setting of patient's liver cirrhosis and palliative treatment. Discussed patient's goals of care. Patient's goals are to stop chemotherapy treatment after C4, and will focus on spending quality time with friends and family.  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 No significant edema or mass effect."  05/16/2019 thoracic spine MRI shows an 11 mm spinal canal mass at C7-T1 favored intradural/intramedullary based on axial postcontrast images but limited assessment due to degree of motion.  An MRI of the cervical spine was recommended.  Widespread osseous metastatic disease with progression from March 2020.  Thoracic progression from CT chest performed in June 2020.  05/20/2019 CT of the neck, chest, abdomen, pelvis showed disease recurrence in the left upper lung as well as disease progression in the liver.  PLAN: -I reviewed the CT scan results performed on 05/20/2019 with the patient.  He is aware that he has disease progression.  Will discuss further recommendations regarding systemic treatment following treatment of his painful spinal metastasis.  Further discussion per Dr. Irene Limbo. -Await MRI results which will be performed tomorrow. -Radiation oncology is following and will make further recommendations pending the results of the MRI. -Continue dexamethasone and current pain medication.   LOS: 4 days   Mikey Bussing, DNP, AGPCNP-BC, AOCNP 05/21/19   ADDENDUM  .Patient was Personally and independently  interviewed, examined and relevant elements of the history of present illness were reviewed in details and an assessment and plan was created. All elements of the patient's history of present illness , assessment and plan were discussed in details with Mikey Bussing, DNP. The above documentation reflects our combined findings assessment and plan.  -I reviewed the CT neck chest abdomen pelvis in details with the patient. -We discussed that he has brought disease progression from his extensive stage small cell lung cancer. -Progression has been within a 2 to 3 months of his carboplatin etoposide chemotherapy and therefore will not benefit from using this again as palliative treatment. -He will be getting an MRI of his cervical and thoracic spine tomorrow.  Would consult radiation oncology and neuro-oncology based on the results to determine role of palliative radiation therapy for symptom control of his significant lower cervical upper thoracic back pain. -Appreciate help from hospitalist team for pain management. -I had an extensive goals of care discussion with the patient about whether he would like to pursue comfort directed therapies and consider hospice versus consider second line palliative chemotherapy options.  We discussed that in general second line palliative chemotherapy options have low response rates and his functional limitations might make it difficult for him to pursue this. -He notes that he enjoyed a good quality of life after his first line of therapy and is hoping that if his back pain is controlled he may function well enough and is strongly inclined to consider further palliative chemotherapy. -We discussed on palliative chemotherapy options and will schedule this as outpatient. -Patient is aware of his overall grave prognosis. -Would encourage patient to consider DNR/DNI status and also a point in legal healthcare proxy if he does not have one already. -Patient may need  additional help at home and will probably need to have the social worker /case manager involved.  Sullivan Lone MD MS

## 2019-05-21 NOTE — Progress Notes (Addendum)
PROGRESS NOTE    Cody [facility]  IWP:[id number] DOB: 12-01-1955 DOA: 05/16/2019 PCP: Default, Provider, MD   Brief Narrative:  Patient is admitted due to intractable pain, vomiting and concern for spinal metastasis. He recently completed radiation for brain mets, and has chronic related left sided body numbness but has been having pain in his back for several weeks as well. The pain is Nayelli Inglis sharp stabbing ache between his shoulderblades and radiates to the sides. He has no associated numbness from this, no extremity weakness, saddle parasthesias or changes in bowel or bladder habits. He came to the ER since he was miserable from the back pain which has been worsening and when the pain gets bad he is unable to keep down anything by mouth.  ED Course:He had labs done which are unremarkable but Kristan Votta motion degraded MRI of the spine shows possible C7/T1 spinal canal mass. ER provider spoke with NSG who feels no intervention needed tonight but recommends admission for contrast MRI of the C spine in 24 hours, followed by their recommendations.  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 [facility]) Active Problems:   Hepatocellular carcinoma (HCC)   History of alcohol abuse   Metastatic cancer (HCC)   Bone metastases ([place])   Liver metastases ([place])   Brain metastasis ([place])   Cervical spinal mass (HCC)   Hyponatremia   Primary small cell malignant neoplasm of lung, stage 4 (HCC)  Intractable Upper back pain , likely from tumor mets MRI of the spine shows possible C7/T1 spinal canal mass Neurosurgery consulted in the ED who recommended c spine MRI however, patient is not able to tolerate due to pain - MRI with anesthesia planned for 8/28 AM - Appreciate neuro oncology recs - appreciate palliative care assistance with pain management - appreciate Dr. Tomasa Blase - MRI C and T spine pending, follow CT chest, abdomen, pelvis, neck (with disease  progression)  N/V; chronic   reports positional and think it related to pain Prn antiemetics Started on steroids for pain control, hopefully will held n/v as well  Hyponatremia:  Sodium 128 on presentation Likely from dehydration, improved with hydration  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, spine and liver mets -diagnosed from 12/2018 Received chemotherapy in 01/2019 -finished xrt to brain  -currently on immunotherapy - repeat CT scan chest/abdomen/pelvis/neck with disease progression  - appreciate oncology recs  DVT prophylaxis: lovenox  Code Status: full  Family Communication: none at bedside - discussed with Beather Arbour Disposition Plan: pending   Consultants:   Palliative care  Neuro oncology  Oncology  Neurosurgery  Rad onc  Procedures:   none  Antimicrobials:  Anti-infectives (From admission, onward)   None         Subjective: Pain is ok when pain meds on time  Objective: Vitals:   05/20/19 0458 05/20/19 2058 05/21/19 0440 05/21/19 1343  BP: (!) 153/73 (!) 152/82 (!) 151/77 (!) 142/85  Pulse: (!) 50 (!) 52 (!) 57 (!) 53  Resp: 18 16 16 20   Temp: 98.4 F (36.9 C) 98.4 F (36.9 C) 98 F (36.7 C) 98.6 F (37 C)  TempSrc: Oral Oral Oral Oral  SpO2: 94% 97% 92% 99%  Weight:      Height:        Intake/Output Summary (Last 24 hours) at 05/21/2019 1757 Last data filed at 05/21/2019 1400 Gross per 24 hour  Intake 360 ml  Output 750 ml  Net -390 ml   Filed Weights   05/16/19 1908 05/17/19 0152  Weight: 79.4 kg 79.4 kg    Examination:  General: No acute distress. Cardiovascular: Heart sounds show Leevon Upperman regular rate, and rhythm.  Lungs: Clear to auscultation bilaterally. Abdomen: Soft, nontender, nondistended Neurological: Alert and oriented 3. Moves all extremities 4. Cranial nerves II through XII grossly intact. Skin: Warm and dry. No rashes or lesions. Extremities: No clubbing or cyanosis. No edema.   Data  Reviewed: I have personally reviewed following labs and imaging studies  CBC: Recent Labs  Lab 05/16/19 1910 05/18/19 0704 05/21/19 0516  WBC 6.6 4.8 6.4  NEUTROABS  --  3.2  --   HGB 14.5 13.5 13.9  HCT 42.5 39.8 41.1  MCV 96.6 98.0 95.1  PLT 192 145* 147   Basic Metabolic Panel: Recent Labs  Lab 05/17/19 0848 05/18/19 0704 05/19/19 0602 05/20/19 0550 05/21/19 0516  NA 134* 134* 132* 133* 132*  K 4.1 5.4* 4.5 4.2 4.1  CL 99 100 101 98 98  CO2 23 24 25 25 25   GLUCOSE 104* 88 135* 123* 112*  BUN 14 13 12 12 13   CREATININE 0.57* 0.56* 0.51* 0.68 0.45*  CALCIUM 8.7* 8.7* 8.8* 9.5 9.0  MG  --  2.1  --   --  1.8   GFR: Estimated Creatinine Clearance: 98.9 mL/min (Marika Mahaffy) (by C-G formula based on SCr of 0.45 mg/dL (L)). Liver Function Tests: Recent Labs  Lab 05/16/19 1910 05/18/19 0704 05/21/19 0516  AST 49* 40 37  ALT 35 31 41  ALKPHOS 96 87 75  BILITOT 1.3* 0.9 0.9  PROT 8.6* 7.2 7.1  ALBUMIN 4.5 3.8 3.7   Recent Labs  Lab 05/16/19 1910  LIPASE 52*   No results for input(s): AMMONIA in the last 168 hours. Coagulation Profile: No results for input(s): INR, PROTIME in the last 168 hours. Cardiac Enzymes: No results for input(s): CKTOTAL, CKMB, CKMBINDEX, TROPONINI in the last 168 hours. BNP (last 3 results) No results for input(s): PROBNP in the last 8760 hours. HbA1C: No results for input(s): HGBA1C in the last 72 hours. CBG: Recent Labs  Lab 05/21/19 0756  GLUCAP 97   Lipid Profile: No results for input(s): CHOL, HDL, LDLCALC, TRIG, CHOLHDL, LDLDIRECT in the last 72 hours. Thyroid Function Tests: No results for input(s): TSH, T4TOTAL, FREET4, T3FREE, THYROIDAB in the last 72 hours. Anemia Panel: No results for input(s): VITAMINB12, FOLATE, FERRITIN, TIBC, IRON, RETICCTPCT in the last 72 hours. Sepsis Labs: No results for input(s): PROCALCITON, LATICACIDVEN in the last 168 hours.  Recent Results (from the past 240 hour(s))  SARS CORONAVIRUS 2 Nasal  Swab Aptima Multi Swab     Status: None   Collection Time: 05/17/19  1:26 AM   Specimen: Aptima Multi Swab; Nasal Swab  Result Value Ref Range Status   SARS Coronavirus 2 NEGATIVE NEGATIVE Final    Comment: (NOTE) SARS-CoV-2 target nucleic acids are NOT DETECTED. The SARS-CoV-2 RNA is generally detectable in upper and lower respiratory specimens during the acute phase of infection. Negative results do not preclude SARS-CoV-2 infection, do not rule out co-infections with other pathogens, and should not be used as the sole basis for treatment or other patient management decisions. Negative results must be combined with clinical observations, patient history, and epidemiological information. The expected result is Negative. Fact Sheet for Patients: SugarRoll.be Fact Sheet for Healthcare Providers: https://www.woods-mathews.com/ This test is not yet approved or cleared by the [place] FDA and  has  been authorized for detection and/or diagnosis of SARS-CoV-2 by FDA under an Emergency Use Authorization (EUA). This EUA will remain  in effect (meaning this test can be used) for the duration of the COVID-19 declaration under Section 56 4(b)(1) of the Act, 21 U.S.C. section 360bbb-3(b)(1), unless the authorization is terminated or revoked sooner. Performed at [facility], [address], [place], [place] [address]          Radiology Studies: Ct Soft Tissue Neck W Contrast  Result Date: 05/20/2019 CLINICAL DATA:  Metastatic small cell carcinoma of the lung. Known brain and spinal metastases. Undergoing radiation therapy. EXAM: CT NECK WITH CONTRAST TECHNIQUE: Multidetector CT imaging of the neck was performed using the standard protocol following the bolus administration of intravenous contrast. CONTRAST:  170mL OMNIPAQUE IOHEXOL 300 MG/ML  SOLN COMPARISON:  None. FINDINGS: PHARYNX AND LARYNX: --Nasopharynx: Fossae of Rosenmuller are  clear. Normal adenoid tonsils for age. --Oral cavity and oropharynx: The palatine and lingual tonsils are normal. The visible oral cavity and floor of mouth are normal. --Hypopharynx: Normal vallecula and pyriform sinuses. --Larynx: Normal epiglottis and pre-epiglottic space. Normal aryepiglottic and vocal folds. --Retropharyngeal space: No abscess, effusion or lymphadenopathy. SALIVARY GLANDS: --Parotid: No mass lesion or inflammation. No sialolithiasis or ductal dilatation. --Submandibular: Symmetric without inflammation. No sialolithiasis or ductal dilatation. --Sublingual: Normal. No ranula or other visible lesion of the base of tongue and floor of mouth. THYROID: Normal. LYMPH NODES: No enlarged or abnormal density cervical lymph nodes. VASCULAR: Major cervical vessels are patent. LIMITED INTRACRANIAL: Normal. VISUALIZED ORBITS: Normal. MASTOIDS AND VISUALIZED PARANASAL SINUSES: No fluid levels or advanced mucosal thickening. No mastoid effusion. SKELETON: Metastatic lesions are again demonstrated within the vertebral bodies at C6 and C7. No pathologic fracture. There are multiple upper thoracic metastases as well. There are age-indeterminate fractures of the spinous processes at C6 and C7. UPPER CHEST: Mediastinal lymphadenopathy and left suprahilar lung mass are better characterized on the concomitant CT of the chest. OTHER: Low-attenuation subcutaneous left submental lesion is likely Jacquelyn Shadrick sebaceous cyst. IMPRESSION: 1. No soft tissue mass or lymphadenopathy of the neck. 2. Multiple osseous metastatic lesions of the cervical and thoracic spine, better characterized on the recent thoracic spine MRI. 3. Age-indeterminate fractures of the spinous processes at C6 and C7. 4. Mediastinal lymphadenopathy and left suprahilar lung mass are better characterized on the concomitant CT of the chest. Electronically Signed   By: Ulyses Jarred M.D.   On: 05/20/2019 23:37   Ct Chest W Contrast  Result Date:  05/20/2019 CLINICAL DATA:  Metastatic small cell carcinoma EXAM: CT CHEST, ABDOMEN, AND PELVIS WITH CONTRAST TECHNIQUE: Multidetector CT imaging of the chest, abdomen and pelvis was performed following the standard protocol during bolus administration of intravenous contrast. CONTRAST:  125mL OMNIPAQUE IOHEXOL 300 MG/ML  SOLN COMPARISON:  MRI 05/16/2019, 409 2020, CT chest abdomen pelvis 03/03/2019, 12/01/2018 FINDINGS: CT CHEST FINDINGS Cardiovascular: Nonaneurysmal aorta. Mild aortic atherosclerosis. Coronary vascular calcification. Normal heart size. Small pericardial effusion. Tumor in case mint of left upper lobe pulmonary arterial vessels which appears significantly narrowed. Mediastinum/Nodes: Incompletely visualized left supraclavicular nodes. Progression of mediastinal adenopathy, now with multiple bulky enlarged lymph nodes. Right upper paratracheal lymph node measures 17 mm, not previously measured. Right paratracheal lymph node, series 2, image number 20 measures 25 mm. Substantially increased AP window node, measuring 23 mm, compared with 13 mm previously. Multiple additional paratracheal and precarinal nodes. Esophagus within normal limits. Lungs/Pleura: Irregular left upper lobe lung mass, significant progression compared to  prior exam, contiguous with bulky left hilar/suprahilar mass. This measures approximately 5.6 cm AP by 4.6 transverse by 5 cm craniocaudad though precise measurements difficult as the mass is contiguous with mediastinal adenopathy. Interim development of small left-sided pleural effusion. 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 Musculoskeletal: Known widespread skeletal metastatic disease. Partially visualized sclerosis at C7 with possible pathologic fracture at the junction of the lamina with the spinous process. Sclerotic metastatic lesion at right T2 vertebral body and posterior elements with pathologic  superior endplate fracture at T2. Metastatic lesions at T9, T10 and T11. New soft tissue mass surrounding the right ninth posterior rib, consistent with metastatic disease. CT ABDOMEN PELVIS FINDINGS Hepatobiliary: Innumerable hypodense metastatic lesions within the liver, progressed compared to prior. Blasa Raisch dominant left lobe lesion measures 7.2 cm by 5.7 cm, and may correspond to Dougles Kimmey previously measured lesion in the region which measured 3 x 18 mm. The gallbladder is contracted. No calcified stone. No biliary dilatation. Pancreas: Unremarkable. No pancreatic ductal dilatation or surrounding inflammatory changes. Spleen: Normal in size without focal abnormality. Adrenals/Urinary Tract: Progressed nodular thickening of the left greater than right adrenal glands, also concerning for progression of disease. Kidneys show no hydronephrosis. Subcentimeter hypodensity in the mid right kidney, too small to further characterize. The bladder is normal Stomach/Bowel: The stomach is nonenlarged. No dilated small bowel. No colon wall thickening. Vascular/Lymphatic: Moderate aortic atherosclerosis without aneurysm. Numerous enlarged porta hepatis, peripancreatic, portal caval and aortocaval nodes. Enlarged Peri aortic lymph nodes, measuring up to 2 cm, all progressed compared to prior. Reproductive: Prostate is unremarkable. Other: Negative for free air or free fluid. Small within the inguinal canals. Musculoskeletal: Stable sclerotic focus superior endplate L5. Stable faint sclerosis in the left iliac bone, adjacent lytic lesion is evident. IMPRESSION: 1. Large irregular left upper lobe mass, extending into the left hilar and suprahilar regions, consistent with disease recurrence. The mass invades the mediastinum and there is encasement of the left pulmonary arterial vessels by the soft tissue mass. 2. Significant progression of metastatic disease as evidence by worsened mediastinal adenopathy, hepatic metastatic disease, and upper  abdominal and retroperitoneal adenopathy. Increased nodularity of the left greater than right adrenal glands, also concerning for progression of metastatic disease. 3. New small left pleural effusion, with interim finding of multiple left pleural based nodules, consistent with pleural metastatic disease. 4. Skeletal metastatic disease with spinal lesions better seen on MRI. Pathologic superior endplate fracture at T2. New soft tissue mass surrounding the right posterior ninth rib, also consistent with metastatic disease. Electronically Signed   By: Donavan Foil M.D.   On: 05/20/2019 23:52   Ct Abdomen Pelvis W Contrast  Result Date: 05/20/2019 CLINICAL DATA:  Metastatic small cell carcinoma EXAM: CT CHEST, ABDOMEN, AND PELVIS WITH CONTRAST TECHNIQUE: Multidetector CT imaging of the chest, abdomen and pelvis was performed following the standard protocol during bolus administration of intravenous contrast. CONTRAST:  178mL OMNIPAQUE IOHEXOL 300 MG/ML  SOLN COMPARISON:  MRI 05/16/2019, 962 2020, CT chest abdomen pelvis 03/03/2019, 12/01/2018 FINDINGS: CT CHEST FINDINGS Cardiovascular: Nonaneurysmal aorta. Mild aortic atherosclerosis. Coronary vascular calcification. Normal heart size. Small pericardial effusion. Tumor in case mint of left upper lobe pulmonary arterial vessels which appears significantly narrowed. Mediastinum/Nodes: Incompletely visualized left supraclavicular nodes. Progression of mediastinal adenopathy, now with multiple bulky enlarged lymph nodes. Right upper paratracheal lymph node measures 17 mm, not previously measured. Right paratracheal lymph node, series 2, image number 20 measures 25  mm. Substantially increased AP window node, measuring 23 mm, compared with 13 mm previously. Multiple additional paratracheal and precarinal nodes. Esophagus within normal limits. Lungs/Pleura: Irregular left upper lobe lung mass, significant progression compared to prior exam, contiguous with bulky left  hilar/suprahilar mass. This measures approximately 5.6 cm AP by 4.6 transverse by 5 cm craniocaudad though precise measurements difficult as the mass is contiguous with mediastinal adenopathy. Interim development of small left-sided pleural effusion. 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 Musculoskeletal: Known widespread skeletal metastatic disease. Partially visualized sclerosis at C7 with possible pathologic fracture at the junction of the lamina with the spinous process. Sclerotic metastatic lesion at right T2 vertebral body and posterior elements with pathologic superior endplate fracture at T2. Metastatic lesions at T9, T10 and T11. New soft tissue mass surrounding the right ninth posterior rib, consistent with metastatic disease. CT ABDOMEN PELVIS FINDINGS Hepatobiliary: Innumerable hypodense metastatic lesions within the liver, progressed compared to prior. Laurajean Hosek dominant left lobe lesion measures 7.2 cm by 5.7 cm, and may correspond to Shonika Kolasinski previously measured lesion in the region which measured 3 x 18 mm. The gallbladder is contracted. No calcified stone. No biliary dilatation. Pancreas: Unremarkable. No pancreatic ductal dilatation or surrounding inflammatory changes. Spleen: Normal in size without focal abnormality. Adrenals/Urinary Tract: Progressed nodular thickening of the left greater than right adrenal glands, also concerning for progression of disease. Kidneys show no hydronephrosis. Subcentimeter hypodensity in the mid right kidney, too small to further characterize. The bladder is normal Stomach/Bowel: The stomach is nonenlarged. No dilated small bowel. No colon wall thickening. Vascular/Lymphatic: Moderate aortic atherosclerosis without aneurysm. Numerous enlarged porta hepatis, peripancreatic, portal caval and aortocaval nodes. Enlarged Peri aortic lymph nodes, measuring up to 2 cm, all progressed compared to prior. Reproductive:  Prostate is unremarkable. Other: Negative for free air or free fluid. Small within the inguinal canals. Musculoskeletal: Stable sclerotic focus superior endplate L5. Stable faint sclerosis in the left iliac bone, adjacent lytic lesion is evident. IMPRESSION: 1. Large irregular left upper lobe mass, extending into the left hilar and suprahilar regions, consistent with disease recurrence. The mass invades the mediastinum and there is encasement of the left pulmonary arterial vessels by the soft tissue mass. 2. Significant progression of metastatic disease as evidence by worsened mediastinal adenopathy, hepatic metastatic disease, and upper abdominal and retroperitoneal adenopathy. Increased nodularity of the left greater than right adrenal glands, also concerning for progression of metastatic disease. 3. New small left pleural effusion, with interim finding of multiple left pleural based nodules, consistent with pleural metastatic disease. 4. Skeletal metastatic disease with spinal lesions better seen on MRI. Pathologic superior endplate fracture at T2. New soft tissue mass surrounding the right posterior ninth rib, also consistent with metastatic disease. Electronically Signed   By: Donavan Foil M.D.   On: 05/20/2019 23:52        Scheduled Meds:  dexamethasone (DECADRON) injection  8 mg Intravenous Q12H   enoxaparin (LOVENOX) injection  40 mg Subcutaneous Daily   oxyCODONE  20 mg Oral Q12H   senna-docusate  1 tablet Oral BID   Continuous Infusions:  sodium chloride 50 mL/hr at 05/21/19 0115     LOS: 4 days    Time spent: over 75 min    Fayrene Helper, MD [facility] Pager AMION  If 7PM-7AM, please contact night-coverage [email or web address] Password [facility] 05/21/2019, 5:57 PM

## 2019-05-22 ENCOUNTER — Inpatient Hospital Stay (HOSPITAL_COMMUNITY): Payer: Medicaid Other | Admitting: Registered Nurse

## 2019-05-22 ENCOUNTER — Inpatient Hospital Stay: Admit: 2019-05-22 | Payer: Self-pay

## 2019-05-22 ENCOUNTER — Ambulatory Visit (HOSPITAL_COMMUNITY)
Admit: 2019-05-22 | Discharge: 2019-05-22 | Disposition: A | Discharge status: Home / Self Care | Payer: Medicaid Other | Attending: Nephrology | Admitting: Nephrology

## 2019-05-22 ENCOUNTER — Encounter (HOSPITAL_COMMUNITY): Payer: Self-pay | Admitting: Registered Nurse

## 2019-05-22 ENCOUNTER — Ambulatory Visit
Admission: RE | Admit: 2019-05-22 | Discharge: 2019-05-22 | Disposition: A | Discharge status: Home / Self Care | Payer: Medicaid Other | Source: Ambulatory Visit | Attending: Radiation Oncology | Admitting: Radiation Oncology

## 2019-05-22 ENCOUNTER — Encounter: Payer: Self-pay | Admitting: *Deleted

## 2019-05-22 ENCOUNTER — Encounter (HOSPITAL_COMMUNITY)
Admission: EM | Disposition: A | Discharge status: Home / Self Care | Payer: Self-pay | Source: Home / Self Care | Attending: Family Medicine

## 2019-05-22 DIAGNOSIS — C7951 Secondary malignant neoplasm of bone: Secondary | ICD-10-CM

## 2019-05-22 HISTORY — PX: RADIOLOGY WITH ANESTHESIA: SHX6223

## 2019-05-22 LAB — CBC
HCT: 44.1 % (ref 39.0–52.0)
Hemoglobin: 15 g/dL (ref 13.0–17.0)
MCH: 32.5 pg (ref 26.0–34.0)
MCHC: 34 g/dL (ref 30.0–36.0)
MCV: 95.5 fL (ref 80.0–100.0)
Platelets: 151 10*3/uL (ref 150–400)
RBC: 4.62 MIL/uL (ref 4.22–5.81)
RDW: 12.1 % (ref 11.5–15.5)
WBC: 5.6 10*3/uL (ref 4.0–10.5)
nRBC: 0 % (ref 0.0–0.2)

## 2019-05-22 LAB — COMPREHENSIVE METABOLIC PANEL
ALT: 53 U/L — ABNORMAL HIGH (ref 0–44)
AST: 44 U/L — ABNORMAL HIGH (ref 15–41)
Albumin: 3.9 g/dL (ref 3.5–5.0)
Alkaline Phosphatase: 88 U/L (ref 38–126)
Anion gap: 10 (ref 5–15)
BUN: 19 mg/dL (ref 8–23)
CO2: 22 mmol/L (ref 22–32)
Calcium: 9.1 mg/dL (ref 8.9–10.3)
Chloride: 99 mmol/L (ref 98–111)
Creatinine, Ser: 0.57 mg/dL — ABNORMAL LOW (ref 0.61–1.24)
GFR calc Af Amer: >60 mL/min (ref >60)
GFR calc non Af Amer: >60 mL/min (ref >60)
Glucose, Bld: 114 mg/dL — ABNORMAL HIGH (ref 70–99)
Potassium: 4.4 mmol/L (ref 3.5–5.1)
Sodium: 131 mmol/L — ABNORMAL LOW (ref 135–145)
Total Bilirubin: 1.1 mg/dL (ref 0.3–1.2)
Total Protein: 7.5 g/dL (ref 6.5–8.1)

## 2019-05-22 SURGERY — MRI WITH ANESTHESIA
Anesthesia: General

## 2019-05-22 MED ORDER — GLYCOPYRROLATE PF 0.2 MG/ML IJ SOSY
PREFILLED_SYRINGE | INTRAMUSCULAR | Status: DC | PRN
  Administered 2019-05-22: .2 mg via INTRAVENOUS

## 2019-05-22 MED ORDER — FENTANYL CITRATE (PF) 100 MCG/2ML IJ SOLN
INTRAMUSCULAR | Status: DC | PRN
  Administered 2019-05-22: 250 ug via INTRAVENOUS

## 2019-05-22 MED ORDER — ONDANSETRON HCL 4 MG/2ML IJ SOLN
INTRAMUSCULAR | Status: DC | PRN
  Administered 2019-05-22: 4 mg via INTRAVENOUS

## 2019-05-22 MED ORDER — GADOBUTROL 1 MMOL/ML IV SOLN
8.0000 mL | Freq: Once | INTRAVENOUS | Status: AC | PRN
  Administered 2019-05-22: 11:00:00 8 mL via INTRAVENOUS

## 2019-05-22 MED ORDER — POLYETHYLENE GLYCOL 3350 17 G PO PACK
17.0000 g | PACK | Freq: Two times a day (BID) | ORAL | Status: DC
  Administered 2019-05-22 – 2019-05-23 (×2): 17 g via ORAL
  Filled 2019-05-22 (×3): qty 1

## 2019-05-22 MED ORDER — PROPOFOL 10 MG/ML IV BOLUS
INTRAVENOUS | Status: DC | PRN
  Administered 2019-05-22: 200 mg via INTRAVENOUS

## 2019-05-22 MED ORDER — MIDAZOLAM HCL 2 MG/2ML IJ SOLN
INTRAMUSCULAR | Status: DC | PRN
  Administered 2019-05-22: 2 mg via INTRAVENOUS

## 2019-05-22 MED ORDER — MORPHINE SULFATE 10 MG/ML IJ SOLN
INTRAMUSCULAR | Status: DC | PRN
  Administered 2019-05-22: 8 mg via INTRAVENOUS

## 2019-05-22 MED ORDER — MORPHINE SULFATE (PF) 4 MG/ML IV SOLN
INTRAVENOUS | Status: AC
  Filled 2019-05-22: qty 2

## 2019-05-22 MED ORDER — EPHEDRINE SULFATE 50 MG/ML IJ SOLN
INTRAMUSCULAR | Status: DC | PRN
  Administered 2019-05-22: 10 mg via INTRAVENOUS

## 2019-05-22 MED ORDER — LACTATED RINGERS IV SOLN
INTRAVENOUS | Status: DC | PRN
  Administered 2019-05-22 (×2): via INTRAVENOUS

## 2019-05-22 MED ORDER — SUGAMMADEX SODIUM 200 MG/2ML IV SOLN
INTRAVENOUS | Status: DC | PRN
  Administered 2019-05-22: 200 mg via INTRAVENOUS

## 2019-05-22 MED ORDER — LACTATED RINGERS IV SOLN
Freq: Once | INTRAVENOUS | Status: AC
  Administered 2019-05-22: 07:00:00 via INTRAVENOUS

## 2019-05-22 MED ORDER — ROCURONIUM BROMIDE 100 MG/10ML IV SOLN
INTRAVENOUS | Status: DC | PRN
  Administered 2019-05-22: 80 mg via INTRAVENOUS

## 2019-05-22 MED ORDER — LIDOCAINE HCL (CARDIAC) PF 100 MG/5ML IV SOSY
PREFILLED_SYRINGE | INTRAVENOUS | Status: DC | PRN
  Administered 2019-05-22: 100 mg via INTRATRACHEAL

## 2019-05-22 MED ORDER — BISACODYL 10 MG RE SUPP
10.0000 mg | Freq: Once | RECTAL | Status: AC
  Administered 2019-05-22: 10 mg via RECTAL
  Filled 2019-05-22: qty 1

## 2019-05-22 MED ORDER — DEXAMETHASONE SODIUM PHOSPHATE 10 MG/ML IJ SOLN
INTRAMUSCULAR | Status: DC | PRN
  Administered 2019-05-22: 10 mg via INTRAVENOUS

## 2019-05-22 MED ORDER — SODIUM CHLORIDE 0.9 % IV SOLN
INTRAVENOUS | Status: DC | PRN
  Administered 2019-05-22: 25 ug/min via INTRAVENOUS

## 2019-05-22 NOTE — Progress Notes (Signed)
PROGRESS NOTE    Cody Montoya [facility]  ZOX:[id number] DOB: [birth year] DOA: 05/16/2019 PCP: Default, Provider, MD   Brief Narrative:  Patient is admitted due to intractable pain, vomiting and concern for spinal metastasis. He recently completed radiation for brain mets, and has chronic related left sided body numbness but has been having pain in his back for several weeks as well. The pain is Cody Montoya sharp stabbing ache between his shoulderblades and radiates to the sides. He has no associated numbness from this, no extremity weakness, saddle parasthesias or changes in bowel or bladder habits. He came to the ER since he was miserable from the back pain which has been worsening and when the pain gets bad he is unable to keep down anything by mouth.  ED Course:He had labs done which are unremarkable but Arretta Toenjes motion degraded MRI of the spine shows possible C7/T1 spinal canal mass. ER provider spoke with NSG who feels no intervention needed tonight but recommends admission for contrast MRI of the C spine in 24 hours, followed by their recommendations.  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 [facility]) Active Problems:   Hepatocellular carcinoma (HCC)   History of alcohol abuse   Metastatic cancer ([place])   Bone metastases ([place])   Liver metastases ([place])   Brain metastasis ([place])   Cervical spinal mass (HCC)   Hyponatremia   Primary small cell malignant neoplasm of lung, stage 4 (HCC)  Intractable Upper back pain , likely from tumor mets MRI of the spine shows possible C7/T1 spinal canal mass Neurosurgery consulted in the ED who recommended c spine MRI however, patient is not able to tolerate due to pain - MRI C and T spine with 17 x 11 mm intramedullary met at C7 with extensive vasogenic edema in the cord.  4 mm nodule along R cauda equina at level of L1, which may indicate subarachnoid tumor spread.  Innumerable metastases throughout the cervical and  thoracic spine, most notable at T1-2 where there is right-sided epidural and foraminal infiltration.  C5-6 and C6-7 degenerative spinal stenosis with cord flattening likely accentuated by the edema. - Appreciate neuro oncology recs - appreciate palliative care assistance with pain management - appreciate Dr. Irene Limbo recs  - MRI C and T spine pending, follow CT chest, abdomen, pelvis, neck (with disease progression) - Will plan to call neuro onc on 8/29  N/V; chronic   reports positional and think it related to pain Prn antiemetics Started on steroids for pain control, hopefully will held n/v as well  Hyponatremia:  Sodium 128 on presentation Likely from dehydration, improved with hydration  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, spine and liver mets -diagnosed from 12/2018 Received chemotherapy in 01/2019 -finished xrt to brain  -currently on immunotherapy - repeat CT scan chest/abdomen/pelvis/neck with disease progression  - appreciate oncology recs  DVT prophylaxis: lovenox  Code Status: full  Family Communication: none at bedside - discussed with Beather Arbour on 8/27 Disposition Plan: pending   Consultants:   Palliative care  Neuro oncology  Oncology  Neurosurgery  Rad onc  Procedures:   none  Antimicrobials:  Anti-infectives (From admission, onward)   None         Subjective: Had MRI and saw rad oncology today.  Objective: Vitals:   05/22/19 1133 05/22/19 1145 05/22/19 1234 05/22/19 1352  BP: 111/71  127/79 (!) 158/95  Pulse: 64 (!) 59 66 61  Resp: 18 (!) 23  20 18  Temp:  (!) 97.2 F (36.2 C) 97.7 F (36.5 C) 97.9 F (36.6 C)  TempSrc:   Oral Oral  SpO2: 95% 94% 97%   Weight:      Height:        Intake/Output Summary (Last 24 hours) at 05/22/2019 1846 Last data filed at 05/22/2019 1701 Gross per 24 hour  Intake 3173.89 ml  Output 200 ml  Net 2973.89 ml   Filed Weights   05/16/19 1908 05/17/19 0152 05/22/19 0705  Weight:  79.4 kg 79.4 kg 77.1 kg    Examination:  General: No acute distress. Cardiovascular: Heart sounds show Marisha Renier regular rate, and rhythm.  Lungs: Clear to auscultation bilaterally  Abdomen: Soft, nontender, nondistended  Neurological: Alert and oriented 3. Moves all extremities 4. Cranial nerves II through XII grossly intact. Skin: Warm and dry. No rashes or lesions. Extremities: No clubbing or cyanosis. No edema.    Data Reviewed: I have personally reviewed following labs and imaging studies  CBC: Recent Labs  Lab 05/16/19 1910 05/18/19 0704 05/21/19 0516 05/22/19 0519  WBC 6.6 4.8 6.4 5.6  NEUTROABS  --  3.2  --   --   HGB 14.5 13.5 13.9 15.0  HCT 42.5 39.8 41.1 44.1  MCV 96.6 98.0 95.1 95.5  PLT 192 145* 171 371   Basic Metabolic Panel: Recent Labs  Lab 05/18/19 0704 05/19/19 0602 05/20/19 0550 05/21/19 0516 05/22/19 0519  NA 134* 132* 133* 132* 131*  K 5.4* 4.5 4.2 4.1 4.4  CL 100 101 98 98 99  CO2 _0 GLUCOSE 88 135* 123* 112* 114*  BUN _1 CREATININE 0.56* 0.51* 0.68 0.45* 0.57*  CALCIUM 8.7* 8.8* 9.5 9.0 9.1  MG 2.1  --   --  1.8  --    GFR: Estimated Creatinine Clearance: 98.9 mL/min (Tylan Briguglio) (by C-G formula based on SCr of 0.57 mg/dL (L)). Liver Function Tests: Recent Labs  Lab 05/16/19 1910 05/18/19 0704 05/21/19 0516 05/22/19 0519  AST 49* 40 37 44*  ALT 35 31 41 53*  ALKPHOS 96 87 75 88  BILITOT 1.3* 0.9 0.9 1.1  PROT 8.6* 7.2 7.1 7.5  ALBUMIN 4.5 3.8 3.7 3.9   Recent Labs  Lab 05/16/19 1910  LIPASE 52*   No results for input(s): AMMONIA in the last 168 hours. Coagulation Profile: No results for input(s): INR, PROTIME in the last 168 hours. Cardiac Enzymes: No results for input(s): CKTOTAL, CKMB, CKMBINDEX, TROPONINI in the last 168 hours. BNP (last 3 results) No results for input(s): PROBNP in the last 8760 hours. HbA1C: No results for input(s): HGBA1C in the last 72 hours. CBG: Recent Labs  Lab 05/21/19 0756    GLUCAP 97   Lipid Profile: No results for input(s): CHOL, HDL, LDLCALC, TRIG, CHOLHDL, LDLDIRECT in the last 72 hours. Thyroid Function Tests: No results for input(s): TSH, T4TOTAL, FREET4, T3FREE, THYROIDAB in the last 72 hours. Anemia Panel: No results for input(s): VITAMINB12, FOLATE, FERRITIN, TIBC, IRON, RETICCTPCT in the last 72 hours. Sepsis Labs: No results for input(s): PROCALCITON, LATICACIDVEN in the last 168 hours.  Recent Results (from the past 240 hour(s))  SARS CORONAVIRUS 2 Nasal Swab Aptima Multi Swab     Status: None   Collection Time: 05/17/19  1:26 AM   Specimen: Aptima Multi Swab; Nasal Swab  Result Value Ref Range Status   SARS Coronavirus 2 NEGATIVE NEGATIVE Final    Comment: (NOTE) SARS-CoV-2 target nucleic  acids are NOT DETECTED. The SARS-CoV-2 RNA is generally detectable in upper and lower respiratory specimens during the acute phase of infection. Negative results do not preclude SARS-CoV-2 infection, do not rule out co-infections with other pathogens, and should not be used as the sole basis for treatment or other patient management decisions. Negative results must be combined with clinical observations, patient history, and epidemiological information. The expected result is Negative. Fact Sheet for Patients: SugarRoll.be Fact Sheet for Healthcare Providers: https://www.woods-mathews.com/ This test is not yet approved or cleared by the [place] FDA and  has been authorized for detection and/or diagnosis of SARS-CoV-2 by FDA under an Emergency Use Authorization (EUA). This EUA will remain  in effect (meaning this test can be used) for the duration of the COVID-19 declaration under Section 56 4(b)(1) of the Act, 21 U.S.C. section 360bbb-3(b)(1), unless the authorization is terminated or revoked sooner. Performed at [facility], [address] [place], [place] [address]          Radiology  Studies: Ct Soft Tissue Neck W Contrast  Result Date: 05/20/2019 CLINICAL DATA:  Metastatic small cell carcinoma of the lung. Known brain and spinal metastases. Undergoing radiation therapy. EXAM: CT NECK WITH CONTRAST TECHNIQUE: Multidetector CT imaging of the neck was performed using the standard protocol following the bolus administration of intravenous contrast. CONTRAST:  192m OMNIPAQUE IOHEXOL 300 MG/ML  SOLN COMPARISON:  None. FINDINGS: PHARYNX AND LARYNX: --Nasopharynx: Fossae of Rosenmuller are clear. Normal adenoid tonsils for age. --Oral cavity and oropharynx: The palatine and lingual tonsils are normal. The visible oral cavity and floor of mouth are normal. --Hypopharynx: Normal vallecula and pyriform sinuses. --Larynx: Normal epiglottis and pre-epiglottic space. Normal aryepiglottic and vocal folds. --Retropharyngeal space: No abscess, effusion or lymphadenopathy. SALIVARY GLANDS: --Parotid: No mass lesion or inflammation. No sialolithiasis or ductal dilatation. --Submandibular: Symmetric without inflammation. No sialolithiasis or ductal dilatation. --Sublingual: Normal. No ranula or other visible lesion of the base of tongue and floor of mouth. THYROID: Normal. LYMPH NODES: No enlarged or abnormal density cervical lymph nodes. VASCULAR: Major cervical vessels are patent. LIMITED INTRACRANIAL: Normal. VISUALIZED ORBITS: Normal. MASTOIDS AND VISUALIZED PARANASAL SINUSES: No fluid levels or advanced mucosal thickening. No mastoid effusion. SKELETON: Metastatic lesions are again demonstrated within the vertebral bodies at C6 and C7. No pathologic fracture. There are multiple upper thoracic metastases as well. There are age-indeterminate fractures of the spinous processes at C6 and C7. UPPER CHEST: Mediastinal lymphadenopathy and left suprahilar lung mass are better characterized on the concomitant CT of the chest. OTHER: Low-attenuation subcutaneous left submental lesion is likely Zhaniya Swallows sebaceous cyst.  IMPRESSION: 1. No soft tissue mass or lymphadenopathy of the neck. 2. Multiple osseous metastatic lesions of the cervical and thoracic spine, better characterized on the recent thoracic spine MRI. 3. Age-indeterminate fractures of the spinous processes at C6 and C7. 4. Mediastinal lymphadenopathy and left suprahilar lung mass are better characterized on the concomitant CT of the chest. Electronically Signed   By: KUlyses JarredM.D.   On: 05/20/2019 23:37   Ct Chest W Contrast  Result Date: 05/20/2019 CLINICAL DATA:  Metastatic small cell carcinoma EXAM: CT CHEST, ABDOMEN, AND PELVIS WITH CONTRAST TECHNIQUE: Multidetector CT imaging of the chest, abdomen and pelvis was performed following the standard protocol during bolus administration of intravenous contrast. CONTRAST:  1017mOMNIPAQUE IOHEXOL 300 MG/ML  SOLN COMPARISON:  MRI 05/16/2019, [id number], CT chest abdomen pelvis 03/03/2019, 12/01/2018 FINDINGS: CT CHEST FINDINGS Cardiovascular: Nonaneurysmal aorta. Mild aortic atherosclerosis.  Coronary vascular calcification. Normal heart size. Small pericardial effusion. Tumor in case mint of left upper lobe pulmonary arterial vessels which appears significantly narrowed. Mediastinum/Nodes: Incompletely visualized left supraclavicular nodes. Progression of mediastinal adenopathy, now with multiple bulky enlarged lymph nodes. Right upper paratracheal lymph node measures 17 mm, not previously measured. Right paratracheal lymph node, series 2, image number 20 measures 25 mm. Substantially increased AP window node, measuring 23 mm, compared with 13 mm previously. Multiple additional paratracheal and precarinal nodes. Esophagus within normal limits. Lungs/Pleura: Irregular left upper lobe lung mass, significant progression compared to prior exam, contiguous with bulky left hilar/suprahilar mass. This measures approximately 5.6 cm AP by 4.6 transverse by 5 cm craniocaudad though precise measurements difficult as the mass  is contiguous with mediastinal adenopathy. Interim development of small left-sided pleural effusion. 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 Musculoskeletal: Known widespread skeletal metastatic disease. Partially visualized sclerosis at C7 with possible pathologic fracture at the junction of the lamina with the spinous process. Sclerotic metastatic lesion at right T2 vertebral body and posterior elements with pathologic superior endplate fracture at T2. Metastatic lesions at T9, T10 and T11. New soft tissue mass surrounding the right ninth posterior rib, consistent with metastatic disease. CT ABDOMEN PELVIS FINDINGS Hepatobiliary: Innumerable hypodense metastatic lesions within the liver, progressed compared to prior. Linsey Hirota dominant left lobe lesion measures 7.2 cm by 5.7 cm, and may correspond to Drelyn Pistilli previously measured lesion in the region which measured 3 x 18 mm. The gallbladder is contracted. No calcified stone. No biliary dilatation. Pancreas: Unremarkable. No pancreatic ductal dilatation or surrounding inflammatory changes. Spleen: Normal in size without focal abnormality. Adrenals/Urinary Tract: Progressed nodular thickening of the left greater than right adrenal glands, also concerning for progression of disease. Kidneys show no hydronephrosis. Subcentimeter hypodensity in the mid right kidney, too small to further characterize. The bladder is normal Stomach/Bowel: The stomach is nonenlarged. No dilated small bowel. No colon wall thickening. Vascular/Lymphatic: Moderate aortic atherosclerosis without aneurysm. Numerous enlarged porta hepatis, peripancreatic, portal caval and aortocaval nodes. Enlarged Peri aortic lymph nodes, measuring up to 2 cm, all progressed compared to prior. Reproductive: Prostate is unremarkable. Other: Negative for free air or free fluid. Small within the inguinal canals. Musculoskeletal: Stable sclerotic focus  superior endplate L5. Stable faint sclerosis in the left iliac bone, adjacent lytic lesion is evident. IMPRESSION: 1. Large irregular left upper lobe mass, extending into the left hilar and suprahilar regions, consistent with disease recurrence. The mass invades the mediastinum and there is encasement of the left pulmonary arterial vessels by the soft tissue mass. 2. Significant progression of metastatic disease as evidence by worsened mediastinal adenopathy, hepatic metastatic disease, and upper abdominal and retroperitoneal adenopathy. Increased nodularity of the left greater than right adrenal glands, also concerning for progression of metastatic disease. 3. New small left pleural effusion, with interim finding of multiple left pleural based nodules, consistent with pleural metastatic disease. 4. Skeletal metastatic disease with spinal lesions better seen on MRI. Pathologic superior endplate fracture at T2. New soft tissue mass surrounding the right posterior ninth rib, also consistent with metastatic disease. Electronically Signed   By: Donavan Foil M.D.   On: 05/20/2019 23:52   Mr Cervical Spine W Wo Contrast  Result Date: 05/22/2019 CLINICAL DATA:  Metastatic disease. EXAM: MRI CERVICAL AND THORACIC SPINE WITHOUT AND WITH CONTRAST TECHNIQUE: Multiplanar and multiecho pulse sequences of the cervical and thoracic spine were obtained without and with  intravenous contrast. CONTRAST:  8 cc Gadavist intravenous COMPARISON:  Thoracic MRI from 6 days ago. FINDINGS: Cervical spine: Alignment: Normal Vertebrae: Metastatic infiltration in the C2 spinous process, C4 body, C5 body, and nearly Dorsey Charette confluent involvement of the C6-T2 vertebrae involving bodies and posterior elements. No pathologic fracture. There is epidural tumor extension along the right aspect of the canal at T1-2, which replaces the right foramen and contacts the cord. Cord: Ovoid mass within the cervical cord at the level of C7, 17 x 11 mm. Cord edema  extends cranially and inferiorly throughout the cervical and upper thoracic cord. It is unclear if this is direct spread from the right foraminal disease at T1-2 or Mckenzee Beem hematogenous metastasis. Posterior Fossa, vertebral arteries, paraspinal tissues: Known adenopathy at the thoracic inlet. Disc levels: C4-5 small central disc protrusion with mild cord deformity. C5-6 disc narrowing with bulging and ridging causing biforaminal impingement and ventral cord flattening. C6-7 left paracentral protrusion and uncovertebral ridging left foraminal impingement. Central disc herniation flattens the ventral cord. Thoracic spine: Alignment: Normal Vertebrae: Innumerable metastases seen at every level within most confluent disease seen in the T1, T2, T8, T9, T11, and T12 vertebral bodies. Epidural extension in the right canal and foramen at T1-2 is noted above. No other epidural metastases are seen. Cord: Cord edema related to cervical spine deposit. There is Aritza Brunet 4 mm enhancing nodule along the upper right cauda equina at the level of L1. Paraspinal tissues: Known mediastinal/left hilar adenopathy/mass. Disc levels: Spondylosis with bridging anterior osteophytes. There are small posterior disc herniations without cord compression. IMPRESSION: 1. 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 Extensive vasogenic edema in the cord. 2. 4 mm nodule along the upper right cauda equina at the level of L1, which may indicate subarachnoid tumor spread. 3. Innumerable metastases throughout the cervical and thoracic spine most notable at T1-2 where there is right-sided epidural and foraminal infiltration. 4. C5-6 and C6-7 degenerative spinal stenosis with cord flattening likely accentuated by the edema. Electronically Signed   By: Monte Fantasia M.D.   On: 05/22/2019 11:30   Mr Thoracic Spine W Wo Contrast  Result Date: 05/22/2019 CLINICAL DATA:  Metastatic disease. EXAM: MRI CERVICAL AND THORACIC SPINE WITHOUT AND WITH CONTRAST TECHNIQUE:  Multiplanar and multiecho pulse sequences of the cervical and thoracic spine were obtained without and with intravenous contrast. CONTRAST:  8 cc Gadavist intravenous COMPARISON:  Thoracic MRI from 6 days ago. FINDINGS: Cervical spine: Alignment: Normal Vertebrae: Metastatic infiltration in the C2 spinous process, C4 body, C5 body, and nearly Darletta Noblett confluent involvement of the C6-T2 vertebrae involving bodies and posterior elements. No pathologic fracture. There is epidural tumor extension along the right aspect of the canal at T1-2, which replaces the right foramen and contacts the cord. Cord: Ovoid mass within the cervical cord at the level of C7, 17 x 11 mm. Cord edema extends cranially and inferiorly throughout the cervical and upper thoracic cord. It is unclear if this is direct spread from the right foraminal disease at T1-2 or Elder Davidian hematogenous metastasis. Posterior Fossa, vertebral arteries, paraspinal tissues: Known adenopathy at the thoracic inlet. Disc levels: C4-5 small central disc protrusion with mild cord deformity. C5-6 disc narrowing with bulging and ridging causing biforaminal impingement and ventral cord flattening. C6-7 left paracentral protrusion and uncovertebral ridging left foraminal impingement. Central disc herniation flattens the ventral cord. Thoracic spine: Alignment: Normal Vertebrae: Innumerable metastases seen at every level within most confluent disease seen in the T1, T2, T8, T9, T11,  and T12 vertebral bodies. Epidural extension in the right canal and foramen at T1-2 is noted above. No other epidural metastases are seen. Cord: Cord edema related to cervical spine deposit. There is Maggi Hershkowitz 4 mm enhancing nodule along the upper right cauda equina at the level of L1. Paraspinal tissues: Known mediastinal/left hilar adenopathy/mass. Disc levels: Spondylosis with bridging anterior osteophytes. There are small posterior disc herniations without cord compression. IMPRESSION: 1. 17 x 11 mm  intramedullary metastasis at C7. Extensive vasogenic edema in the cord. 2. 4 mm nodule along the upper right cauda equina at the level of L1, which may indicate subarachnoid tumor spread. 3. Innumerable metastases throughout the cervical and thoracic spine most notable at T1-2 where there is right-sided epidural and foraminal infiltration. 4. C5-6 and C6-7 degenerative spinal stenosis with cord flattening likely accentuated by the edema. Electronically Signed   By: Monte Fantasia M.D.   On: 05/22/2019 11:30   Ct Abdomen Pelvis W Contrast  Result Date: 05/20/2019 CLINICAL DATA:  Metastatic small cell carcinoma EXAM: CT CHEST, ABDOMEN, AND PELVIS WITH CONTRAST TECHNIQUE: Multidetector CT imaging of the chest, abdomen and pelvis was performed following the standard protocol during bolus administration of intravenous contrast. CONTRAST:  15m OMNIPAQUE IOHEXOL 300 MG/ML  SOLN COMPARISON:  MRI 05/16/2019, [id number], CT chest abdomen pelvis 03/03/2019, 12/01/2018 FINDINGS: CT CHEST FINDINGS Cardiovascular: Nonaneurysmal aorta. Mild aortic atherosclerosis. Coronary vascular calcification. Normal heart size. Small pericardial effusion. Tumor in case mint of left upper lobe pulmonary arterial vessels which appears significantly narrowed. Mediastinum/Nodes: Incompletely visualized left supraclavicular nodes. Progression of mediastinal adenopathy, now with multiple bulky enlarged lymph nodes. Right upper paratracheal lymph node measures 17 mm, not previously measured. Right paratracheal lymph node, series 2, image number 20 measures 25 mm. Substantially increased AP window node, measuring 23 mm, compared with 13 mm previously. Multiple additional paratracheal and precarinal nodes. Esophagus within normal limits. Lungs/Pleura: Irregular left upper lobe lung mass, significant progression compared to prior exam, contiguous with bulky left hilar/suprahilar mass. This measures approximately 5.6 cm AP by 4.6 transverse by 5 cm  craniocaudad though precise measurements difficult as the mass is contiguous with mediastinal adenopathy. Interim development of small left-sided pleural effusion. 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 Musculoskeletal: Known widespread skeletal metastatic disease. Partially visualized sclerosis at C7 with possible pathologic fracture at the junction of the lamina with the spinous process. Sclerotic metastatic lesion at right T2 vertebral body and posterior elements with pathologic superior endplate fracture at T2. Metastatic lesions at T9, T10 and T11. New soft tissue mass surrounding the right ninth posterior rib, consistent with metastatic disease. CT ABDOMEN PELVIS FINDINGS Hepatobiliary: Innumerable hypodense metastatic lesions within the liver, progressed compared to prior. Jetson Pickrel dominant left lobe lesion measures 7.2 cm by 5.7 cm, and may correspond to Kele Withem previously measured lesion in the region which measured 3 x 18 mm. The gallbladder is contracted. No calcified stone. No biliary dilatation. Pancreas: Unremarkable. No pancreatic ductal dilatation or surrounding inflammatory changes. Spleen: Normal in size without focal abnormality. Adrenals/Urinary Tract: Progressed nodular thickening of the left greater than right adrenal glands, also concerning for progression of disease. Kidneys show no hydronephrosis. Subcentimeter hypodensity in the mid right kidney, too small to further characterize. The bladder is normal Stomach/Bowel: The stomach is nonenlarged. No dilated small bowel. No colon wall thickening. Vascular/Lymphatic: Moderate aortic atherosclerosis without aneurysm. Numerous enlarged porta hepatis, peripancreatic, portal caval and aortocaval nodes. Enlarged Peri aortic  lymph nodes, measuring up to 2 cm, all progressed compared to prior. Reproductive: Prostate is unremarkable. Other: Negative for free air or free fluid. Small within the  inguinal canals. Musculoskeletal: Stable sclerotic focus superior endplate L5. Stable faint sclerosis in the left iliac bone, adjacent lytic lesion is evident. IMPRESSION: 1. Large irregular left upper lobe mass, extending into the left hilar and suprahilar regions, consistent with disease recurrence. The mass invades the mediastinum and there is encasement of the left pulmonary arterial vessels by the soft tissue mass. 2. Significant progression of metastatic disease as evidence by worsened mediastinal adenopathy, hepatic metastatic disease, and upper abdominal and retroperitoneal adenopathy. Increased nodularity of the left greater than right adrenal glands, also concerning for progression of metastatic disease. 3. New small left pleural effusion, with interim finding of multiple left pleural based nodules, consistent with pleural metastatic disease. 4. Skeletal metastatic disease with spinal lesions better seen on MRI. Pathologic superior endplate fracture at T2. New soft tissue mass surrounding the right posterior ninth rib, also consistent with metastatic disease. Electronically Signed   By: Donavan Foil M.D.   On: 05/20/2019 23:52        Scheduled Meds:  dexamethasone (DECADRON) injection  8 mg Intravenous Q12H   enoxaparin (LOVENOX) injection  40 mg Subcutaneous Daily   oxyCODONE  20 mg Oral Q12H   senna-docusate  1 tablet Oral BID   Continuous Infusions:  sodium chloride 50 mL/hr at 05/22/19 1701     LOS: 5 days    Time spent: over 30 min    Fayrene Helper, MD [facility] Pager AMION  If 7PM-7AM, please contact night-coverage [email or web address] Password [facility] 05/22/2019, 6:46 PM

## 2019-05-22 NOTE — Progress Notes (Signed)
Oncology Nurse Navigator Documentation  Oncology Nurse Navigator Flowsheets 05/22/2019  Navigator Location [facility]  Navigator Encounter Type Other/I received a message from [place] that patient is having trouble paying for his prescriptions.  I reached out to financial advocates to see if they can help.  I will also updated CSW.    Patient Visit Type Inpatient  Barriers/Navigation Needs Financial  Interventions Referrals  Referrals Financial Counseling  Acuity Level 2  Time Spent with Patient 30

## 2019-05-22 NOTE — Anesthesia Procedure Notes (Signed)
Procedure Name: Intubation Date/Time: 05/22/2019 8:35 AM Performed by: Jearld Pies, CRNA Pre-anesthesia Checklist: Patient identified, Emergency Drugs available, Suction available and Patient being monitored Patient Re-evaluated:Patient Re-evaluated prior to induction Oxygen Delivery Method: Circle System Utilized Preoxygenation: Pre-oxygenation with 100% oxygen Induction Type: IV induction, Rapid sequence and Cricoid Pressure applied Laryngoscope Size: Mac and 4 Grade View: Grade III Tube type: Oral Tube size: 7.5 mm Number of attempts: 1 Airway Equipment and Method: Stylet and Oral airway Placement Confirmation: ETT inserted through vocal cords under direct vision,  positive ETCO2 and breath sounds checked- equal and bilateral Secured at: 23 cm Tube secured with: Tape Dental Injury: Teeth and Oropharynx as per pre-operative assessment  Comments: RSI d/t N/V @ 0500.

## 2019-05-22 NOTE — Anesthesia Postprocedure Evaluation (Signed)
Anesthesia Post Note  Patient: Cody Montoya  Procedure(s) Performed: MRI CERVICAL SPINE AND THORACIC SPINE WITH AND WITHOUT CONTRAST, WITH ANESTHESIA (N/A )     Patient location during evaluation: PACU Anesthesia Type: General Level of consciousness: awake and alert Pain management: pain level controlled Vital Signs Assessment: post-procedure vital signs reviewed and stable Respiratory status: spontaneous breathing, nonlabored ventilation, respiratory function stable and patient connected to nasal cannula oxygen Cardiovascular status: blood pressure returned to baseline and stable Postop Assessment: no apparent nausea or vomiting Anesthetic complications: no    Last Vitals:  Vitals:   05/22/19 1145 05/22/19 1234  BP:  127/79  Pulse: (!) 59 66  Resp: (!) 23 20  Temp: (!) 36.2 C 36.5 C  SpO2: 94% 97%    Last Pain:  Vitals:   05/22/19 1234  TempSrc: Oral  PainSc:                  Nyeemah Jennette

## 2019-05-22 NOTE — Progress Notes (Signed)
Report given to Care link for transfer to [facility] for MRI with sedation.  Report had been given to [place] at [place] [facility] [phone] of Morphine 4 mg at 0505 and that patient had been NPO and had 200 CC emesis 0530 and would receive zofran prior to transport.  Patient verbalized understanding procedure and that he would be transported back to [facility] after the MRI. Walked to Biomedical scientist. NSL left AC intact.

## 2019-05-22 NOTE — Transfer of Care (Signed)
Immediate Anesthesia Transfer of Care Note  Patient: Cody Montoya  Procedure(s) Performed: MRI CERVICAL SPINE AND THORACIC SPINE WITH AND WITHOUT CONTRAST, WITH ANESTHESIA (N/A )  Patient Location: PACU  Anesthesia Type:General  Level of Consciousness: awake, alert  and oriented  Airway & Oxygen Therapy: Patient Spontanous Breathing and Patient connected to face mask oxygen  Post-op Assessment: Report given to RN and Post -op Vital signs reviewed and stable  Post vital signs: Reviewed and stable  Last Vitals:  Vitals Value Taken Time  BP 128/79 05/22/19 1033  Temp 97.0 F   Pulse 63 05/22/19 1040  Resp 19 05/22/19 1040  SpO2 96 % 05/22/19 1040  Vitals shown include unvalidated device data.  Last Pain:  Vitals:   05/22/19 0649  TempSrc: Oral  PainSc:       Patients Stated Pain Goal: 3 (84/66/59 9357)  Complications: No apparent anesthesia complications

## 2019-05-23 LAB — CBC
HCT: 40.1 % (ref 39.0–52.0)
Hemoglobin: 13.4 g/dL (ref 13.0–17.0)
MCH: 32.4 pg (ref 26.0–34.0)
MCHC: 33.4 g/dL (ref 30.0–36.0)
MCV: 96.9 fL (ref 80.0–100.0)
Platelets: 168 10*3/uL (ref 150–400)
RBC: 4.14 MIL/uL — ABNORMAL LOW (ref 4.22–5.81)
RDW: 12.1 % (ref 11.5–15.5)
WBC: 7.1 10*3/uL (ref 4.0–10.5)
nRBC: 0 % (ref 0.0–0.2)

## 2019-05-23 LAB — COMPREHENSIVE METABOLIC PANEL
ALT: 50 U/L — ABNORMAL HIGH (ref 0–44)
AST: 43 U/L — ABNORMAL HIGH (ref 15–41)
Albumin: 3.6 g/dL (ref 3.5–5.0)
Alkaline Phosphatase: 78 U/L (ref 38–126)
Anion gap: 10 (ref 5–15)
BUN: 17 mg/dL (ref 8–23)
CO2: 22 mmol/L (ref 22–32)
Calcium: 8.6 mg/dL — ABNORMAL LOW (ref 8.9–10.3)
Chloride: 99 mmol/L (ref 98–111)
Creatinine, Ser: 0.56 mg/dL — ABNORMAL LOW (ref 0.61–1.24)
GFR calc Af Amer: >60 mL/min (ref >60)
GFR calc non Af Amer: >60 mL/min (ref >60)
Glucose, Bld: 108 mg/dL — ABNORMAL HIGH (ref 70–99)
Potassium: 4.3 mmol/L (ref 3.5–5.1)
Sodium: 131 mmol/L — ABNORMAL LOW (ref 135–145)
Total Bilirubin: 1.1 mg/dL (ref 0.3–1.2)
Total Protein: 6.5 g/dL (ref 6.5–8.1)

## 2019-05-23 LAB — MAGNESIUM: Magnesium: 2.2 mg/dL (ref 1.7–2.4)

## 2019-05-23 MED ORDER — MORPHINE SULFATE (PF) 4 MG/ML IV SOLN
4.0000 mg | INTRAVENOUS | Status: DC | PRN
  Administered 2019-05-23 – 2019-05-24 (×5): 4 mg via INTRAVENOUS
  Filled 2019-05-23 (×6): qty 1

## 2019-05-23 MED ORDER — OXYCODONE HCL 5 MG PO TABS
10.0000 mg | ORAL_TABLET | ORAL | Status: DC | PRN
  Administered 2019-05-23 – 2019-05-24 (×4): 10 mg via ORAL
  Filled 2019-05-23 (×5): qty 2

## 2019-05-23 MED ORDER — SENNOSIDES-DOCUSATE SODIUM 8.6-50 MG PO TABS
2.0000 | ORAL_TABLET | Freq: Two times a day (BID) | ORAL | Status: DC
  Administered 2019-05-23 – 2019-05-24 (×2): 2 via ORAL
  Filled 2019-05-23 (×2): qty 2

## 2019-05-23 MED ORDER — OXYCODONE HCL ER 40 MG PO T12A
40.0000 mg | EXTENDED_RELEASE_TABLET | Freq: Two times a day (BID) | ORAL | Status: DC
  Administered 2019-05-23 – 2019-05-24 (×2): 40 mg via ORAL
  Filled 2019-05-23 (×3): qty 1

## 2019-05-23 NOTE — Progress Notes (Signed)
Daily Progress Note   Patient Name: Cody Montoya [facility]       Date: 05/23/2019 DOB: 01-Sep-1956  Age: 63 y.o. MRN#: [id number] Attending Physician: Elodia Florence., * Primary Care Physician: Default, Provider, MD Admit Date: 05/16/2019  Reason for Consultation/Follow-up: Establishing goals of care and Pain control  Subjective: I met with Mr. Cody Montoya today.  We discussed his pain regimen and he reports that he has been doing better overall on current regimen.  He has had oxycontin 56m BID as well as continuing moprhine 421mIV prn with 32 mg in the last 24 hours.  He reports today that he wants to convert to oral regimen as he is hoping to discharge soon.  Reports his last BM was a couple days ago.  Length of Stay: 6  Current Medications: Scheduled Meds:  . dexamethasone (DECADRON) injection  8 mg Intravenous Q12H  . enoxaparin (LOVENOX) injection  40 mg Subcutaneous Daily  . oxyCODONE  40 mg Oral Q12H  . polyethylene glycol  17 g Oral BID  . senna-docusate  2 tablet Oral BID    Continuous Infusions: . sodium chloride 50 mL/hr at 05/23/19 1100    PRN Meds: calcium carbonate, morphine injection, ondansetron (ZOFRAN) IV, oxyCODONE, traZODone  Physical Exam         General: Alert, awake, in no acute distress.  HEENT: No bruits, no goiter, no JVD Heart: Regular rate and rhythm. No murmur appreciated. Lungs: Good air movement, clear Abdomen: Soft, nontender, nondistended, positive bowel sounds.  Ext: No significant edema Skin: Warm and dry Neuro: Grossly intact, nonfocal.  Vital Signs: BP (!) 160/90 (BP Location: Right Arm)   Pulse 63   Temp 98.4 F (36.9 C)   Resp 16   Ht 5' 10"  (1.778 m)   Wt 77.1 kg   SpO2 96%   BMI 24.39 kg/m  SpO2: SpO2: 96 % O2 Device:  O2 Device: Room Air O2 Flow Rate: O2 Flow Rate (L/min): 7 L/min  Intake/output summary:   Intake/Output Summary (Last 24 hours) at 05/23/2019 2230 Last data filed at 05/23/2019 1100 Gross per 24 hour  Intake 894.07 ml  Output -  Net 894.07 ml   LBM: Last BM Date: 05/16/19 Baseline Weight: Weight: 79.4 kg Most recent weight: Weight: 77.1 kg       Palliative  Assessment/Data:      Patient Active Problem List   Diagnosis Date Noted  . Primary small cell malignant neoplasm of lung, stage 4 ([place])   . 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 Cervical spinal mass ([place]) 05/17/2019  . Hyponatremia 05/17/2019  . Brain metastasis ([place]) 05/14/2019  . Extensive stage primary small cell carcinoma of lung ([place]) 12/15/2018  . Counseling regarding advance care planning and goals of care 12/15/2018  . Metastatic cancer ([place])   . Bone metastases ([place])   . Liver metastases ([place])   . Hepatocellular carcinoma ([place]) 11/29/2018  . History of alcohol abuse 11/29/2018  . Hypokalemia 11/29/2018    Palliative Care Assessment & Plan   Patient Profile: 63 year old gentleman with extensive stage small cell carcinoma, widespread metastatic disease throughout the liver and bone, concern for thoracic MRI showing spinal canal mass at C7-T1   Recommendations/Plan: Pain: In the last 24 hours he has had oxycontin 57m BID as well as 357mof IV morphine (equivalent to 6461mf oxycodone) for the equivalent of 104m61m oxycodone.  Will plan to increase Oxycontin to 40mg74m with oxycodone 10mg 54my 3 hours as needed.  I also left second line IV morphine to be given 40 minutes after oral medication if he does not get effective relief from oral medication. Constipation: Increase senna to 2 tabs BID  Goals of Care and Additional Recommendations: ? Limitations on Scope of Treatment: Full Scope Treatment  Code Status:    Code Status Orders  (From admission, onward)          Start     Ordered   05/17/19 0025  Full code  Continuous     05/17/19 0026        Code Status History    Date Active Date Inactive Code Status Order ID Comments User Context   12/16/2018 2244 12/17/2018 0145 DNR [id number], Brunetta Generautpatient   11/29/2018 1702 12/02/2018 1819 Full Code [id number] PalD   Advance Care Planning Activity    Advance Directive Documentation     Most Recent Value  Type of Advance Directive  Healthcare Power of Attorney, Living will  Pre-existing out of facility DNR order (yellow form or pink MOST form)  -  "MOST" Form in Place?  -       Prognosis:   Unable to determine  Discharge Planning:  To Be Determined  Care plan was discussed with patient, Dr. PowellFlorene Glenk you for allowing the Palliative Medicine Team to assist in the care of this patient.   Time In: 1500 Time Out: 1545 Total Time 45 Prolonged Time Billed No      Greater than 50%  of this time was spent counseling and coordinating care related to the above assessment and plan.  [name] FMicheline RoughPlease contact Palliative Medicine Team phone at [phone] and concerns.

## 2019-05-23 NOTE — Progress Notes (Signed)
PT Cancellation Note  Patient Details Name: Kweku Stankey MRN: [id number] DOB: [birth year]   Cancelled Treatment:    Reason Eval/Treat Not Completed: PT screened, no needs identified, will sign off. Spoke with pt who denies need for PTservices. PT reported he has been walking in the hallways unassisted. Will sign off at pt's request.   Weston Anna, PT Acute Rehabilitation Services Pager: [phone] Office: [phone]

## 2019-05-23 NOTE — Progress Notes (Signed)
PROGRESS NOTE    Cody Montoya [facility]  HMC:[id number] DOB: 06/01/1956 DOA: 05/16/2019 PCP: Default, Provider, MD   Brief Narrative:  Patient is admitted due to intractable pain, vomiting and concern for spinal metastasis. He recently completed radiation for brain mets, and has chronic related left sided body numbness but has been having pain in his back for several weeks as well. The pain is Cynda Soule sharp stabbing ache between his shoulderblades and radiates to the sides. He has no associated numbness from this, no extremity weakness, saddle parasthesias or changes in bowel or bladder habits. He came to the ER since he was miserable from the back pain which has been worsening and when the pain gets bad he is unable to keep down anything by mouth.  ED Course:He had labs done which are unremarkable but Roseanne Juenger motion degraded MRI of the spine shows possible C7/T1 spinal canal mass. ER provider spoke with NSG who feels no intervention needed tonight but recommends admission for contrast MRI of the C spine in 24 hours, followed by their recommendations.  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 [facility]) Active Problems:   Hepatocellular carcinoma (HCC)   History of alcohol abuse   Metastatic cancer ([place])   Bone metastases ([place])   Liver metastases ([place])   Brain metastasis ([place])   Cervical spinal mass (HCC)   Hyponatremia   Primary small cell malignant neoplasm of lung, stage 4 (HCC)  Intractable Upper back pain , likely from tumor mets MRI of the spine shows possible C7/T1 spinal canal mass Neurosurgery consulted in the ED who recommended c spine MRI however, patient is not able to tolerate due to pain - MRI C and T spine with 17 x 11 mm intramedullary met at C7 with extensive vasogenic edema in the cord.  4 mm nodule along R cauda equina at level of L1, which may indicate subarachnoid tumor spread.  Innumerable metastases throughout the cervical and  thoracic spine, most notable at T1-2 where there is right-sided epidural and foraminal infiltration.  C5-6 and C6-7 degenerative spinal stenosis with cord flattening likely accentuated by the edema. - Appreciate neuro oncology recs - appreciate palliative care assistance with pain management - transitioning to PO today - appreciate Dr. Tomasa Blase  - MRI C and T spine pending, follow CT chest, abdomen, pelvis, neck (with disease progression) - Will plan to call neuro onc on 8/29 (no answer, will try again tomorrow)  N/V; chronic   reports positional and think it related to pain Prn antiemetics Started on steroids for pain control, hopefully will held n/v as well  Hyponatremia:  Sodium 128 on presentation Likely from dehydration, improved with hydration  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, spine and liver mets -diagnosed from 12/2018 Received chemotherapy in 01/2019 -finished xrt to brain  -currently on immunotherapy - repeat CT scan chest/abdomen/pelvis/neck with disease progression  - appreciate oncology recs  DVT prophylaxis: lovenox  Code Status: full  Family Communication: none at bedside - discussed with Beather Arbour on 8/27 Disposition Plan: pending   Consultants:   Palliative care  Neuro oncology  Oncology  Neurosurgery  Rad onc  Procedures:   none  Antimicrobials:  Anti-infectives (From admission, onward)   None         Subjective: Eager to get home Pain is ok, but still requiring IV pain meds  Objective: Vitals:   05/22/19 1352 05/22/19 2019 05/23/19 0510 05/23/19 1412  BP: Marland Kitchen)  158/95 139/88 127/81 (!) 160/90  Pulse: 61 66 (!) 56 63  Resp: _0 Temp: 97.9 F (36.6 C) 98.9 F (37.2 C) 99.6 F (37.6 C) 98.4 F (36.9 C)  TempSrc: Oral Oral Oral   SpO2:  96% 100% 96%  Weight:      Height:        Intake/Output Summary (Last 24 hours) at 05/23/2019 1753 Last data filed at 05/23/2019 1100 Gross per 24 hour  Intake  894.07 ml  Output --  Net 894.07 ml   Filed Weights   05/16/19 1908 05/17/19 0152 05/22/19 0705  Weight: 79.4 kg 79.4 kg 77.1 kg    Examination:  General: No acute distress. Cardiovascular: Heart sounds show Jaicion Laurie regular rate, and rhythm. Lungs: Clear to auscultation bilaterally Abdomen: Soft, nontender, nondistended Neurological: Alert and oriented 3. Moves all extremities 4. Cranial nerves II through XII grossly intact. Skin: Warm and dry. No rashes or lesions. Extremities: No clubbing or cyanosis. No edema.   Data Reviewed: I have personally reviewed following labs and imaging studies  CBC: Recent Labs  Lab 05/16/19 1910 05/18/19 0704 05/21/19 0516 05/22/19 0519 05/23/19 0538  WBC 6.6 4.8 6.4 5.6 7.1  NEUTROABS  --  3.2  --   --   --   HGB 14.5 13.5 13.9 15.0 13.4  HCT 42.5 39.8 41.1 44.1 40.1  MCV 96.6 98.0 95.1 95.5 96.9  PLT 192 145* 171 151 626   Basic Metabolic Panel: Recent Labs  Lab 05/18/19 0704 05/19/19 0602 05/20/19 0550 05/21/19 0516 05/22/19 0519 05/23/19 0538  NA 134* 132* 133* 132* 131* 131*  K 5.4* 4.5 4.2 4.1 4.4 4.3  CL 100 101 98 98 99 99  CO2 _1 GLUCOSE 88 135* 123* 112* 114* 108*  BUN _2 CREATININE 0.56* 0.51* 0.68 0.45* 0.57* 0.56*  CALCIUM 8.7* 8.8* 9.5 9.0 9.1 8.6*  MG 2.1  --   --  1.8  --  2.2   GFR: Estimated Creatinine Clearance: 98.9 mL/min (Tiombe Tomeo) (by C-G formula based on SCr of 0.56 mg/dL (L)). Liver Function Tests: Recent Labs  Lab 05/16/19 1910 05/18/19 0704 05/21/19 0516 05/22/19 0519 05/23/19 0538  AST 49* 40 37 44* 43*  ALT 35 31 41 53* 50*  ALKPHOS 96 87 75 88 78  BILITOT 1.3* 0.9 0.9 1.1 1.1  PROT 8.6* 7.2 7.1 7.5 6.5  ALBUMIN 4.5 3.8 3.7 3.9 3.6   Recent Labs  Lab 05/16/19 1910  LIPASE 52*   No results for input(s): AMMONIA in the last 168 hours. Coagulation Profile: No results for input(s): INR, PROTIME in the last 168 hours. Cardiac Enzymes: No results for input(s):  CKTOTAL, CKMB, CKMBINDEX, TROPONINI in the last 168 hours. BNP (last 3 results) No results for input(s): PROBNP in the last 8760 hours. HbA1C: No results for input(s): HGBA1C in the last 72 hours. CBG: Recent Labs  Lab 05/21/19 0756  GLUCAP 97   Lipid Profile: No results for input(s): CHOL, HDL, LDLCALC, TRIG, CHOLHDL, LDLDIRECT in the last 72 hours. Thyroid Function Tests: No results for input(s): TSH, T4TOTAL, FREET4, T3FREE, THYROIDAB in the last 72 hours. Anemia Panel: No results for input(s): VITAMINB12, FOLATE, FERRITIN, TIBC, IRON, RETICCTPCT in the last 72 hours. Sepsis Labs: No results for input(s): PROCALCITON, LATICACIDVEN in the last 168 hours.  Recent Results (from the past 240 hour(s))  SARS CORONAVIRUS 2 Nasal Swab Aptima Multi Swab  Status: None   Collection Time: 05/17/19  1:26 AM   Specimen: Aptima Multi Swab; Nasal Swab  Result Value Ref Range Status   SARS Coronavirus 2 NEGATIVE NEGATIVE Final    Comment: (NOTE) SARS-CoV-2 target nucleic acids are NOT DETECTED. The SARS-CoV-2 RNA is generally detectable in upper and lower respiratory specimens during the acute phase of infection. Negative results do not preclude SARS-CoV-2 infection, do not rule out co-infections with other pathogens, and should not be used as the sole basis for treatment or other patient management decisions. Negative results must be combined with clinical observations, patient history, and epidemiological information. The expected result is Negative. Fact Sheet for Patients: SugarRoll.be Fact Sheet for Healthcare Providers: https://www.woods-mathews.com/ This test is not yet approved or cleared by the [place] FDA and  has been authorized for detection and/or diagnosis of SARS-CoV-2 by FDA under an Emergency Use Authorization (EUA). This EUA will remain  in effect (meaning this test can be used) for the duration of the COVID-19  declaration under Section 56 4(b)(1) of the Act, 21 U.S.C. section 360bbb-3(b)(1), unless the authorization is terminated or revoked sooner. Performed at [facility], [place] [address] [place], [place] [address]          Radiology Studies: Mr Cervical Spine W Wo Contrast  Result Date: 05/22/2019 CLINICAL DATA:  Metastatic disease. EXAM: MRI CERVICAL AND THORACIC SPINE WITHOUT AND WITH CONTRAST TECHNIQUE: Multiplanar and multiecho pulse sequences of the cervical and thoracic spine were obtained without and with intravenous contrast. CONTRAST:  8 cc Gadavist intravenous COMPARISON:  Thoracic MRI from 6 days ago. FINDINGS: Cervical spine: Alignment: Normal Vertebrae: Metastatic infiltration in the C2 spinous process, C4 body, C5 body, and nearly Barbee Mamula confluent involvement of the C6-T2 vertebrae involving bodies and posterior elements. No pathologic fracture. There is epidural tumor extension along the right aspect of the canal at T1-2, which replaces the right foramen and contacts the cord. Cord: Ovoid mass within the cervical cord at the level of C7, 17 x 11 mm. Cord edema extends cranially and inferiorly throughout the cervical and upper thoracic cord. It is unclear if this is direct spread from the right foraminal disease at T1-2 or Eliaz Fout hematogenous metastasis. Posterior Fossa, vertebral arteries, paraspinal tissues: Known adenopathy at the thoracic inlet. Disc levels: C4-5 small central disc protrusion with mild cord deformity. C5-6 disc narrowing with bulging and ridging causing biforaminal impingement and ventral cord flattening. C6-7 left paracentral protrusion and uncovertebral ridging left foraminal impingement. Central disc herniation flattens the ventral cord. Thoracic spine: Alignment: Normal Vertebrae: Innumerable metastases seen at every level within most confluent disease seen in the T1, T2, T8, T9, T11, and T12 vertebral bodies. Epidural extension in the right canal and foramen at T1-2  is noted above. No other epidural metastases are seen. Cord: Cord edema related to cervical spine deposit. There is Eldo Umanzor 4 mm enhancing nodule along the upper right cauda equina at the level of L1. Paraspinal tissues: Known mediastinal/left hilar adenopathy/mass. Disc levels: Spondylosis with bridging anterior osteophytes. There are small posterior disc herniations without cord compression. IMPRESSION: 1. 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 Extensive vasogenic edema in the cord. 2. 4 mm nodule along the upper right cauda equina at the level of L1, which may indicate subarachnoid tumor spread. 3. Innumerable metastases throughout the cervical and thoracic spine most notable at T1-2 where there is right-sided epidural and foraminal infiltration. 4. C5-6 and C6-7 degenerative spinal stenosis with cord flattening likely accentuated  by the edema. Electronically Signed   By: Monte Fantasia M.D.   On: 05/22/2019 11:30   Mr Thoracic Spine W Wo Contrast  Result Date: 05/22/2019 CLINICAL DATA:  Metastatic disease. EXAM: MRI CERVICAL AND THORACIC SPINE WITHOUT AND WITH CONTRAST TECHNIQUE: Multiplanar and multiecho pulse sequences of the cervical and thoracic spine were obtained without and with intravenous contrast. CONTRAST:  8 cc Gadavist intravenous COMPARISON:  Thoracic MRI from 6 days ago. FINDINGS: Cervical spine: Alignment: Normal Vertebrae: Metastatic infiltration in the C2 spinous process, C4 body, C5 body, and nearly Clare Fennimore confluent involvement of the C6-T2 vertebrae involving bodies and posterior elements. No pathologic fracture. There is epidural tumor extension along the right aspect of the canal at T1-2, which replaces the right foramen and contacts the cord. Cord: Ovoid mass within the cervical cord at the level of C7, 17 x 11 mm. Cord edema extends cranially and inferiorly throughout the cervical and upper thoracic cord. It is unclear if this is direct spread from the right foraminal disease at T1-2 or Wallis Spizzirri  hematogenous metastasis. Posterior Fossa, vertebral arteries, paraspinal tissues: Known adenopathy at the thoracic inlet. Disc levels: C4-5 small central disc protrusion with mild cord deformity. C5-6 disc narrowing with bulging and ridging causing biforaminal impingement and ventral cord flattening. C6-7 left paracentral protrusion and uncovertebral ridging left foraminal impingement. Central disc herniation flattens the ventral cord. Thoracic spine: Alignment: Normal Vertebrae: Innumerable metastases seen at every level within most confluent disease seen in the T1, T2, T8, T9, T11, and T12 vertebral bodies. Epidural extension in the right canal and foramen at T1-2 is noted above. No other epidural metastases are seen. Cord: Cord edema related to cervical spine deposit. There is Heleena Miceli 4 mm enhancing nodule along the upper right cauda equina at the level of L1. Paraspinal tissues: Known mediastinal/left hilar adenopathy/mass. Disc levels: Spondylosis with bridging anterior osteophytes. There are small posterior disc herniations without cord compression. IMPRESSION: 1. 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 Extensive vasogenic edema in the cord. 2. 4 mm nodule along the upper right cauda equina at the level of L1, which may indicate subarachnoid tumor spread. 3. Innumerable metastases throughout the cervical and thoracic spine most notable at T1-2 where there is right-sided epidural and foraminal infiltration. 4. C5-6 and C6-7 degenerative spinal stenosis with cord flattening likely accentuated by the edema. Electronically Signed   By: Monte Fantasia M.D.   On: 05/22/2019 11:30        Scheduled Meds:  dexamethasone (DECADRON) injection  8 mg Intravenous Q12H   enoxaparin (LOVENOX) injection  40 mg Subcutaneous Daily   oxyCODONE  40 mg Oral Q12H   polyethylene glycol  17 g Oral BID   senna-docusate  2 tablet Oral BID   Continuous Infusions:  sodium chloride 50 mL/hr at 05/23/19 1100     LOS:  6 days    Time spent: over 30 min    Fayrene Helper, MD [facility] Pager AMION  If 7PM-7AM, please contact night-coverage [email or web address] Password [facility] 05/23/2019, 5:53 PM

## 2019-05-24 LAB — CBC
HCT: 43.7 % (ref 39.0–52.0)
Hemoglobin: 14.8 g/dL (ref 13.0–17.0)
MCH: 32.9 pg (ref 26.0–34.0)
MCHC: 33.9 g/dL (ref 30.0–36.0)
MCV: 97.1 fL (ref 80.0–100.0)
Platelets: 196 10*3/uL (ref 150–400)
RBC: 4.5 MIL/uL (ref 4.22–5.81)
RDW: 12.2 % (ref 11.5–15.5)
WBC: 12.2 10*3/uL — ABNORMAL HIGH (ref 4.0–10.5)
nRBC: 0 % (ref 0.0–0.2)

## 2019-05-24 LAB — COMPREHENSIVE METABOLIC PANEL
ALT: 65 U/L — ABNORMAL HIGH (ref 0–44)
AST: 55 U/L — ABNORMAL HIGH (ref 15–41)
Albumin: 3.9 g/dL (ref 3.5–5.0)
Alkaline Phosphatase: 97 U/L (ref 38–126)
Anion gap: 11 (ref 5–15)
BUN: 17 mg/dL (ref 8–23)
CO2: 24 mmol/L (ref 22–32)
Calcium: 8.9 mg/dL (ref 8.9–10.3)
Chloride: 97 mmol/L — ABNORMAL LOW (ref 98–111)
Creatinine, Ser: 0.72 mg/dL (ref 0.61–1.24)
GFR calc Af Amer: >60 mL/min (ref >60)
GFR calc non Af Amer: >60 mL/min (ref >60)
Glucose, Bld: 106 mg/dL — ABNORMAL HIGH (ref 70–99)
Potassium: 4.4 mmol/L (ref 3.5–5.1)
Sodium: 132 mmol/L — ABNORMAL LOW (ref 135–145)
Total Bilirubin: 1.3 mg/dL — ABNORMAL HIGH (ref 0.3–1.2)
Total Protein: 7.3 g/dL (ref 6.5–8.1)

## 2019-05-24 LAB — MAGNESIUM: Magnesium: 2.2 mg/dL (ref 1.7–2.4)

## 2019-05-24 MED ORDER — MORPHINE SULFATE (PF) 4 MG/ML IV SOLN
4.0000 mg | INTRAVENOUS | Status: AC
  Administered 2019-05-24: 4 mg via SUBCUTANEOUS
  Filled 2019-05-24: qty 1

## 2019-05-24 MED ORDER — OXYCODONE HCL 15 MG PO TABS: 15.0000 mg | ORAL_TABLET | ORAL | 0 refills | Status: DC | PRN

## 2019-05-24 MED ORDER — OXYCODONE HCL 5 MG PO TABS
15.0000 mg | ORAL_TABLET | ORAL | Status: DC | PRN
  Administered 2019-05-24: 15 mg via ORAL
  Filled 2019-05-24 (×2): qty 3

## 2019-05-24 MED ORDER — DEXAMETHASONE 4 MG PO TABS: 4.0000 mg | ORAL_TABLET | Freq: Every day | ORAL | 0 refills | Status: DC

## 2019-05-24 MED ORDER — SENNOSIDES-DOCUSATE SODIUM 8.6-50 MG PO TABS: 2.0000 | ORAL_TABLET | Freq: Two times a day (BID) | ORAL | 0 refills | Status: AC

## 2019-05-24 MED ORDER — MORPHINE SULFATE 15 MG PO TABS: 15.0000 mg | ORAL_TABLET | ORAL | 0 refills | Status: DC | PRN

## 2019-05-24 MED ORDER — ACETAMINOPHEN 500 MG PO TABS: 1000.0000 mg | ORAL_TABLET | Freq: Three times a day (TID) | ORAL | 0 refills | Status: AC | PRN

## 2019-05-24 MED ORDER — OXYCODONE HCL ER 40 MG PO T12A: 40.0000 mg | EXTENDED_RELEASE_TABLET | Freq: Two times a day (BID) | ORAL | 0 refills | Status: DC

## 2019-05-24 NOTE — Progress Notes (Signed)
Pt is being discharged home with his ex-wife. Discharge education and prescriptions provided.

## 2019-05-24 NOTE — Progress Notes (Signed)
Daily Progress Note   Patient Name: Webb Weed [facility]       Date: 05/24/2019 DOB: 03-30-1956  Age: 63 y.o. MRN#: [id number] Attending Physician: Elodia Florence., * Primary Care Physician: Default, Provider, MD Admit Date: 05/16/2019  Reason for Consultation/Follow-up: Establishing goals of care and Pain control  Subjective: I met with Mr. Whisenant today.    He has lost IV access and waiting for IV team to reestablish.  States that he has not been getting adequate relief from oxycodone 59m and has needed several doses of IV morphine overnight.  Reports his last BM was a couple days ago.  Length of Stay: 7  Current Medications: Scheduled Meds:  . dexamethasone (DECADRON) injection  8 mg Intravenous Q12H  . enoxaparin (LOVENOX) injection  40 mg Subcutaneous Daily  .  morphine injection  4 mg Subcutaneous NOW  . oxyCODONE  40 mg Oral Q12H  . polyethylene glycol  17 g Oral BID  . senna-docusate  2 tablet Oral BID    Continuous Infusions: . sodium chloride 50 mL/hr at 05/24/19 0503    PRN Meds: calcium carbonate, morphine injection, ondansetron (ZOFRAN) IV, oxyCODONE, traZODone  Physical Exam         General: Alert, awake, in no acute distress.  HEENT: No bruits, no goiter, no JVD Heart: Regular rate and rhythm. No murmur appreciated. Lungs: Good air movement, clear Abdomen: Soft, nontender, nondistended, positive bowel sounds.  Ext: No significant edema Skin: Warm and dry Neuro: Grossly intact, nonfocal.  Vital Signs: BP 128/72 (BP Location: Right Arm)   Pulse (!) 57   Temp 98.1 F (36.7 C) (Oral)   Resp 20   Ht 5' 10"  (1.778 m)   Wt 77.1 kg   SpO2 94%   BMI 24.39 kg/m  SpO2: SpO2: 94 % O2 Device: O2 Device: Room Air O2 Flow Rate: O2 Flow Rate  (L/min): 7 L/min  Intake/output summary:   Intake/Output Summary (Last 24 hours) at 05/24/2019 0925 Last data filed at 05/23/2019 1100 Gross per 24 hour  Intake 250.07 ml  Output -  Net 250.07 ml   LBM: Last BM Date: 05/16/19 Baseline Weight: Weight: 79.4 kg Most recent weight: Weight: 77.1 kg       Palliative Assessment/Data:      Patient Active Problem List   Diagnosis  Date Noted  . Primary small cell malignant neoplasm of lung, stage 4 ([place])   . 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 Cervical spinal mass ([place]) 05/17/2019  . Hyponatremia 05/17/2019  . Brain metastasis ([place]) 05/14/2019  . 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 Counseling regarding advance care planning and goals of care 12/15/2018  . Metastatic cancer ([place])   . Bone metastases ([place])   . Liver metastases ([place])   . Hepatocellular carcinoma ([place]) 11/29/2018  . History of alcohol abuse 11/29/2018  . Hypokalemia 11/29/2018    Palliative Care Assessment & Plan   Patient Profile: 62 year old gentleman with extensive stage small cell carcinoma, widespread metastatic disease throughout the liver and bone, concern for thoracic MRI showing spinal canal mass at C7-T1   Recommendations/Plan: Pain: Lost IV access.  Plan for one dose morphine 23m subcutaneous now.  Continue oxycontin 455mBID.  Has not been getting adequate relief from rescue oxycodone 1050m Increase rescue to oxycodone 28m56mery 3 hours as needed. Constipation: Continue miralax and senna to 2 tabs BID  Goals of Care and Additional Recommendations: ? Limitations on Scope of Treatment: Full Scope Treatment  Code Status:    Code Status Orders  (From admission, onward)         Start     Ordered   05/17/19 0025  Full code  Continuous     05/17/19 0026        Code Status History    Date Active Date Inactive Code Status Order ID Comments User Context   12/16/2018 2244  12/17/2018 0145 DNR [id number] [facility]   11/29/2018 1702 12/02/2018 1819 Full Code [id number] [facility]   Advance Care Planning Activity    Advance Directive Documentation     Most Recent Value  Type of Advance Directive  Healthcare Power of Attorney, Living will  Pre-existing out of facility DNR order (yellow form or pink MOST form)  -  "MOST" Form in Place?  -       Prognosis:   Unable to determine  Discharge Planning:  To Be Determined  Care plan was discussed with patient, Dr. PoweFlorene Glenank you for allowing the Palliative Medicine Team to assist in the care of this patient.   Time In: 0845 Time Out: 0930 Total Time 45 Prolonged Time Billed No      Greater than 50%  of this time was spent counseling and coordinating care related to the above assessment and plan.  GeneMicheline Rough  Please contact Palliative Medicine Team phone at [phone] questions and concerns.

## 2019-05-24 NOTE — Progress Notes (Signed)
Palliative care progress note  I checked in with Mr. Garguilo again this afternoon.  He reports pain has been better managed on current regimen.  He would like to go home this afternoon.  We had discussed earlier today about possible rotation of his short acting medication from oxycodone to MSIR 15mg  as he thinks that IV morphine works better than oxycodone.  He requested to have small script of MSIR to try once his is home to see if it is more effective or if his oxycodone is more effective once he is at home and more active.  I believe this is reasonable and he understands not to take both medications at the same time.  He reports having enough Oxycontin and Oxycodone for 3-4 days and will follow-up with radiation oncology on Monday.  He would like me to call and see if he can get any assistance to afford medication through cancer center before filling any further prescriptions.  On discharge recommend:  1) Oxycontin 40mg  twice daily  2)Oxycodone 15mg  every three hours as needed for breakthrough pain OR MSIR 15mg  every three hours as needed for breakthrough pain (please give small supply on discharge)  - He understand not to use multiple short acting medications at the same time and will not take these within 3 hours of each other  3) Senna 2 tabs twice daily  4) Miralax 17g prn constipation  D/w Dr. Jon Gills, MD [place] Team [phone]

## 2019-05-24 NOTE — Discharge Summary (Signed)
Physician Discharge Summary  Cody Montoya ZHG:[id number] DOB: 05-Jun-1956 DOA: 05/16/2019  PCP: Default, Provider, MD  Admit date: 05/16/2019 Discharge date: 05/24/2019  Time spent: 40 minutes  Recommendations for Outpatient Follow-up:  1. Follow outpatient CBC/CMP 2. Follow pain control outpatient  3. Follow up MRI and CT studies with Dr. Mickeal Skinner and Dr. Irene Limbo outpatient 4. Follow up with radiation oncology this Tuesday as planned 5. 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 He recently completed radiation for brain mets, and has chronic related left sided body numbness but has been having pain in his back for several weeks as well. The pain is Yanni Ruberg sharp stabbing ache between his shoulderblades and radiates to the sides. He has no associated numbness from this, no extremity weakness, saddle parasthesias or changes in bowel or bladder habits. He came to the ER since he was miserable from the back pain which has been worsening and when the pain gets bad he is unable to keep down anything by mouth.  ED Course:He had labs done which are unremarkable but Liller Yohn motion degraded MRI of the spine shows possible C7/T1 spinal canal mass. ER provider spoke with NSG who feels no intervention needed tonight but recommends admission for  contrast MRI of the C spine in 24 hours, followed by their recommendations.  He was admitted for intractable pain and found to have MRI with evidence of metastases.  He had CT chest/abdomen/pelvis/neck with disease progression.  Appreciate palliative care assistance with pain control.  He was discharged with oral pain meds and steroids.  Hospital Course:  Intractable Upper back pain , likely from tumor mets MRI of the spine shows possible C7/T1 spinal canal mass Neurosurgery consulted in the ED who recommended c spine MRI however, patient is not able to tolerate due to pain - MRI C and T spine with 17 x 11 mm intramedullary met at C7 with extensive vasogenic edema in the cord.  4 mm nodule along R cauda equina at level of L1, which may indicate subarachnoid tumor spread.  Innumerable metastases throughout the cervical and thoracic spine, most notable at T1-2 where there is right-sided epidural and foraminal infiltration.  C5-6 and C6-7 degenerative spinal stenosis with cord flattening likely accentuated by the edema. - Appreciate neuro oncology recs - appreciate palliative care assistance with pain management - transitioning to PO today - appreciate Dr. Tomasa Blase  - MRI C and T spine pending, follow CT chest, abdomen, pelvis, neck (with disease progression) - radiation oncology planning for radiation this week - Discussed with neuro oncology on 8/30, plans to f/u outpatient this week  N/V; chronic  reports positional and think it related to pain Prn antiemetics Started on steroids for pain control, hopefully will held n/v as well  Hyponatremia:  Sodium 128 on presentation Likely  from dehydration, improved with hydration  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, spine and liver mets -diagnosed from 12/2018 Received chemotherapy in 01/2019 -finished xrt to brain  -currently on immunotherapy - repeat CT scan chest/abdomen/pelvis/neck with disease progression  - appreciate  oncology recs   Procedures: MRI with anesthesia  Consultations:  Oncology  Neuro oncology  Neurosurgery  Radiation oncology  Palliative care  Discharge Exam: Vitals:   05/24/19 0546 05/24/19 1411  BP: 128/72 (!) 161/75  Pulse: (!) 57 60  Resp: 20 16  Temp: 98.1 F (36.7 C) 98.5 F (36.9 C)  SpO2: 94% 97%   Looking forward to getting home. Discussed discharge plan, medications, follow up recs  General: No acute distress. Cardiovascular: Heart sounds show Kitrina Maurin regular rate, and rhythm. Lungs: Clear to auscultation bilaterally  Abdomen: Soft, nontender, nondistended Neurological: Alert and oriented 3. Moves all extremities 4. Cranial nerves II through XII grossly intact. Skin: Warm and dry. No rashes or lesions. Extremities: No clubbing or cyanosis. No edema.   Discharge Instructions   Discharge Instructions    Diet - low sodium heart healthy   Complete by: As directed    Discharge instructions   Complete by: As directed    You were seen for back pain.  You have extensive stage small cell lung cancer with metastasis that have spread to the spine which is likely the cause of your pain.   You had repeat CT scan which also showed disease progression.  Take the dexamethasone 4 mg once daily.  Please follow up with Dr. Mickeal Skinner, Dr. Irene Limbo, and radiation oncology as scheduled.  Schedule follow up with Dr. Mickeal Skinner and Dr. Irene Limbo this week to discuss further plans for treatment.  Take the oxycontin 40 mg twice daily with oxycodone 15 mg every three hours as needed (you can try MSIR every 3 hours as needed if you'd like, but do not take the oxycodone and MSIR within 3 hours of each other).  Follow up with radiation oncology as scheduled.  Follow up with Dr. Irene Limbo and Dr. Mickeal Skinner this week.  Return for new, recurrent, or worsening symptoms.  Please ask your PCP to request records from this hospitalization so they know what was done and what the next steps will be.    Increase activity slowly   Complete by: As directed      Allergies as of 05/24/2019   No Known Allergies     Medication List    STOP taking these medications   Doans Extra Strength 580 MG Tabs Generic drug: Magnesium Salicylate Tetrahyd   ibuprofen 200 MG tablet Commonly known as: ADVIL   naproxen sodium 220 MG tablet Commonly known as: ALEVE     TAKE these medications   acetaminophen 500 MG tablet Commonly known as: TYLENOL Take 2 tablets (1,000 mg total) by mouth every 8 (eight) hours as needed for moderate pain. What changed:   how much to take  when to take this   B Complex Vitamins Soln Take 1 mL by mouth daily.   Biofreeze 10.5 % Aero Generic drug: Menthol Apply 1 application topically every 4 (four) hours as needed (pain).   Calcium 600-200 MG-UNIT tablet Take 1 tablet by mouth daily.   dexamethasone 4 MG tablet Commonly known as: DECADRON Take 1 tablet (4 mg total) by mouth daily.   dimenhyDRINATE 50 MG tablet Commonly known as: DRAMAMINE Take 50 mg by mouth every 8 (eight) hours as needed for dizziness.   docusate sodium 100  MG capsule Commonly known as: COLACE Take 1 capsule (100 mg total) by mouth 2 (two) times daily. What changed:   how much to take  when to take this   LORazepam 0.5 MG tablet Commonly known as: Ativan 1 tab po q 4-6 hours prn or 1 tab po 30 minutes prior to MRI or radiation What changed:   how much to take  how to take this  when to take this  reasons to take this  additional instructions   memantine 5 MG tablet Commonly known as: NAMENDA Start on first day of XRT: Week 1: take 1 tablet po q am Week 2: take 1 tablet po q am and 1 tablet po q pm Week 3: take 2 tablets po q am, and 1 tablets po q pm Week 4-24: take 2 tablets po q am, and 2 tablets po q pm.  Stop after 24 weeks 10/09/2019 should be last day   morphine 15 MG tablet Commonly known as: MSIR Take 1 tablet (15 mg total) by mouth every 3 (three) hours  as needed for up to 3 days for severe pain.   multivitamin with minerals Tabs tablet Take 1 tablet by mouth daily.   ondansetron 8 MG tablet Commonly known as: Zofran Take 1 tablet (8 mg total) by mouth 2 (two) times daily as needed for refractory nausea / vomiting. Start on day 3 after carboplatin chemo.   oxyCODONE 40 mg 12 hr tablet Commonly known as: OXYCONTIN Take 1 tablet (40 mg total) by mouth every 12 (twelve) hours for 3 days. What changed:   medication strength  how much to take   oxyCODONE 15 MG immediate release tablet Commonly known as: ROXICODONE Take 1 tablet (15 mg total) by mouth every 3 (three) hours as needed for up to 3 days for breakthrough pain (first line pain medication.). What changed:   medication strength  how much to take  when to take this  reasons to take this   polyethylene glycol 17 g packet Commonly known as: MIRALAX / GLYCOLAX Take 17 g by mouth daily as needed for mild constipation. What changed: when to take this   senna-docusate 8.6-50 MG tablet Commonly known as: Senokot-S Take 2 tablets by mouth 2 (two) times daily.   Vitamin D 50 MCG (2000 UT) Caps Take 2,000 Units by mouth daily.            Durable Medical Equipment  (From admission, onward)         Start     Ordered   05/21/19 1944  For home use only DME Bedside commode  Once    Question:  Patient needs Avonda Toso bedside commode to treat with the following condition  Answer:  Metastatic cancer ([place])   05/21/19 1944   05/21/19 1943  For home use only DME Hospital bed  Once    Question Answer Comment  Length of Need Lifetime   The above medical condition requires: Patient requires the ability to reposition frequently   Bed type Semi-electric      05/21/19 1943         No Known Allergies Follow-up Information    Ventura Sellers, MD Follow up.   Specialties: Psychiatry, Neurology, Oncology Contact information: [place]  [address] [phone]        Brunetta Genera, MD Follow up.   Specialties: Hematology, Oncology Contact information: [place] [address] [phone]  The results of significant diagnostics from this hospitalization (including imaging, microbiology, ancillary and laboratory) are listed below for reference.    Significant Diagnostic Studies: Ct Soft Tissue Neck W Contrast  Result Date: 05/20/2019 CLINICAL DATA:  Metastatic small cell carcinoma of the lung. Known brain and spinal metastases. Undergoing radiation therapy. EXAM: CT NECK WITH CONTRAST TECHNIQUE: Multidetector CT imaging of the neck was performed using the standard protocol following the bolus administration of intravenous contrast. CONTRAST:  122m OMNIPAQUE IOHEXOL 300 MG/ML  SOLN COMPARISON:  None. FINDINGS: PHARYNX AND LARYNX: --Nasopharynx: Fossae of Rosenmuller are clear. Normal adenoid tonsils for age. --Oral cavity and oropharynx: The palatine and lingual tonsils are normal. The visible oral cavity and floor of mouth are normal. --Hypopharynx: Normal vallecula and pyriform sinuses. --Larynx: Normal epiglottis and pre-epiglottic space. Normal aryepiglottic and vocal folds. --Retropharyngeal space: No abscess, effusion or lymphadenopathy. SALIVARY GLANDS: --Parotid: No mass lesion or inflammation. No sialolithiasis or ductal dilatation. --Submandibular: Symmetric without inflammation. No sialolithiasis or ductal dilatation. --Sublingual: Normal. No ranula or other visible lesion of the base of tongue and floor of mouth. THYROID: Normal. LYMPH NODES: No enlarged or abnormal density cervical lymph nodes. VASCULAR: Major cervical vessels are patent. LIMITED INTRACRANIAL: Normal. VISUALIZED ORBITS: Normal. MASTOIDS AND VISUALIZED PARANASAL SINUSES: No fluid levels or advanced mucosal thickening. No mastoid effusion. SKELETON: Metastatic lesions are again demonstrated within the vertebral  bodies at C6 and C7. No pathologic fracture. There are multiple upper thoracic metastases as well. There are age-indeterminate fractures of the spinous processes at C6 and C7. UPPER CHEST: Mediastinal lymphadenopathy and left suprahilar lung mass are better characterized on the concomitant CT of the chest. OTHER: Low-attenuation subcutaneous left submental lesion is likely Ha Shannahan sebaceous cyst. IMPRESSION: 1. No soft tissue mass or lymphadenopathy of the neck. 2. Multiple osseous metastatic lesions of the cervical and thoracic spine, better characterized on the recent thoracic spine MRI. 3. Age-indeterminate fractures of the spinous processes at C6 and C7. 4. Mediastinal lymphadenopathy and left suprahilar lung mass are better characterized on the concomitant CT of the chest. Electronically Signed   By: KUlyses JarredM.D.   On: 05/20/2019 23:37   Ct Chest W Contrast  Result Date: 05/20/2019 CLINICAL DATA:  Metastatic small cell carcinoma EXAM: CT CHEST, ABDOMEN, AND PELVIS WITH CONTRAST TECHNIQUE: Multidetector CT imaging of the chest, abdomen and pelvis was performed following the standard protocol during bolus administration of intravenous contrast. CONTRAST:  1065mOMNIPAQUE IOHEXOL 300 MG/ML  SOLN COMPARISON:  MRI 05/16/2019, [id number], CT chest abdomen pelvis 03/03/2019, 12/01/2018 FINDINGS: CT CHEST FINDINGS Cardiovascular: Nonaneurysmal aorta. Mild aortic atherosclerosis. Coronary vascular calcification. Normal heart size. Small pericardial effusion. Tumor in case mint of left upper lobe pulmonary arterial vessels which appears significantly narrowed. Mediastinum/Nodes: Incompletely visualized left supraclavicular nodes. Progression of mediastinal adenopathy, now with multiple bulky enlarged lymph nodes. Right upper paratracheal lymph node measures 17 mm, not previously measured. Right paratracheal lymph node, series 2, image number 20 measures 25 mm. Substantially increased AP window node, measuring 23 mm,  compared with 13 mm previously. Multiple additional paratracheal and precarinal nodes. Esophagus within normal limits. Lungs/Pleura: Irregular left upper lobe lung mass, significant progression compared to prior exam, contiguous with bulky left hilar/suprahilar mass. This measures approximately 5.6 cm AP by 4.6 transverse by 5 cm craniocaudad though precise measurements difficult as the mass is contiguous with mediastinal adenopathy. Interim development of small left-sided pleural effusion. Now seen are multiple pleural based lung nodules in the left thorax, concerning for  pleural metastatic disease. Musculoskeletal: Known widespread skeletal metastatic disease. Partially visualized sclerosis at C7 with possible pathologic fracture at the junction of the lamina with the spinous process. Sclerotic metastatic lesion at right T2 vertebral body and posterior elements with pathologic superior endplate fracture at T2. Metastatic lesions at T9, T10 and T11. New soft tissue mass surrounding the right ninth posterior rib, consistent with metastatic disease. CT ABDOMEN PELVIS FINDINGS Hepatobiliary: Innumerable hypodense metastatic lesions within the liver, progressed compared to prior. Andrews Tener dominant left lobe lesion measures 7.2 cm by 5.7 cm, and may correspond to Ellanore Vanhook previously measured lesion in the region which measured 3 x 18 mm. The gallbladder is contracted. No calcified stone. No biliary dilatation. Pancreas: Unremarkable. No pancreatic ductal dilatation or surrounding inflammatory changes. Spleen: Normal in size without focal abnormality. Adrenals/Urinary Tract: Progressed nodular thickening of the left greater than right adrenal glands, also concerning for progression of disease. Kidneys show no hydronephrosis. Subcentimeter hypodensity in the mid right kidney, too small to further characterize. The bladder is normal Stomach/Bowel: The stomach is nonenlarged. No dilated small bowel. No colon wall thickening.  Vascular/Lymphatic: Moderate aortic atherosclerosis without aneurysm. Numerous enlarged porta hepatis, peripancreatic, portal caval and aortocaval nodes. Enlarged Peri aortic lymph nodes, measuring up to 2 cm, all progressed compared to prior. Reproductive: Prostate is unremarkable. Other: Negative for free air or free fluid. Small within the inguinal canals. Musculoskeletal: Stable sclerotic focus superior endplate L5. Stable faint sclerosis in the left iliac bone, adjacent lytic lesion is evident. IMPRESSION: 1. Large irregular left upper lobe mass, extending into the left hilar and suprahilar regions, consistent with disease recurrence. The mass invades the mediastinum and there is encasement of the left pulmonary arterial vessels by the soft tissue mass. 2. Significant progression of metastatic disease as evidence by worsened mediastinal adenopathy, hepatic metastatic disease, and upper abdominal and retroperitoneal adenopathy. Increased nodularity of the left greater than right adrenal glands, also concerning for progression of metastatic disease. 3. New small left pleural effusion, with interim finding of multiple left pleural based nodules, consistent with pleural metastatic disease. 4. Skeletal metastatic disease with spinal lesions better seen on MRI. Pathologic superior endplate fracture at T2. New soft tissue mass surrounding the right posterior ninth rib, also consistent with metastatic disease. Electronically Signed   By: Donavan Foil M.D.   On: 05/20/2019 23:52   Mr Cervical Spine W Wo Contrast  Result Date: 05/22/2019 CLINICAL DATA:  Metastatic disease. EXAM: MRI CERVICAL AND THORACIC SPINE WITHOUT AND WITH CONTRAST TECHNIQUE: Multiplanar and multiecho pulse sequences of the cervical and thoracic spine were obtained without and with intravenous contrast. CONTRAST:  8 cc Gadavist intravenous COMPARISON:  Thoracic MRI from 6 days ago. FINDINGS: Cervical spine: Alignment: Normal Vertebrae:  Metastatic infiltration in the C2 spinous process, C4 body, C5 body, and nearly Juwann Sherk confluent involvement of the C6-T2 vertebrae involving bodies and posterior elements. No pathologic fracture. There is epidural tumor extension along the right aspect of the canal at T1-2, which replaces the right foramen and contacts the cord. Cord: Ovoid mass within the cervical cord at the level of C7, 17 x 11 mm. Cord edema extends cranially and inferiorly throughout the cervical and upper thoracic cord. It is unclear if this is direct spread from the right foraminal disease at T1-2 or Hezekiah Veltre hematogenous metastasis. Posterior Fossa, vertebral arteries, paraspinal tissues: Known adenopathy at the thoracic inlet. Disc levels: C4-5 small central disc protrusion with mild cord deformity. C5-6 disc narrowing with bulging and ridging causing  biforaminal impingement and ventral cord flattening. C6-7 left paracentral protrusion and uncovertebral ridging left foraminal impingement. Central disc herniation flattens the ventral cord. Thoracic spine: Alignment: Normal Vertebrae: Innumerable metastases seen at every level within most confluent disease seen in the T1, T2, T8, T9, T11, and T12 vertebral bodies. Epidural extension in the right canal and foramen at T1-2 is noted above. No other epidural metastases are seen. Cord: Cord edema related to cervical spine deposit. There is Ronan Duecker 4 mm enhancing nodule along the upper right cauda equina at the level of L1. Paraspinal tissues: Known mediastinal/left hilar adenopathy/mass. Disc levels: Spondylosis with bridging anterior osteophytes. There are small posterior disc herniations without cord compression. IMPRESSION: 1. 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 Extensive vasogenic edema in the cord. 2. 4 mm nodule along the upper right cauda equina at the level of L1, which may indicate subarachnoid tumor spread. 3. Innumerable metastases throughout the cervical and thoracic spine most notable at T1-2  where there is right-sided epidural and foraminal infiltration. 4. C5-6 and C6-7 degenerative spinal stenosis with cord flattening likely accentuated by the edema. Electronically Signed   By: Monte Fantasia M.D.   On: 05/22/2019 11:30   Mr Thoracic Spine W Wo Contrast  Result Date: 05/22/2019 CLINICAL DATA:  Metastatic disease. EXAM: MRI CERVICAL AND THORACIC SPINE WITHOUT AND WITH CONTRAST TECHNIQUE: Multiplanar and multiecho pulse sequences of the cervical and thoracic spine were obtained without and with intravenous contrast. CONTRAST:  8 cc Gadavist intravenous COMPARISON:  Thoracic MRI from 6 days ago. FINDINGS: Cervical spine: Alignment: Normal Vertebrae: Metastatic infiltration in the C2 spinous process, C4 body, C5 body, and nearly Paton Crum confluent involvement of the C6-T2 vertebrae involving bodies and posterior elements. No pathologic fracture. There is epidural tumor extension along the right aspect of the canal at T1-2, which replaces the right foramen and contacts the cord. Cord: Ovoid mass within the cervical cord at the level of C7, 17 x 11 mm. Cord edema extends cranially and inferiorly throughout the cervical and upper thoracic cord. It is unclear if this is direct spread from the right foraminal disease at T1-2 or Suvan Stcyr hematogenous metastasis. Posterior Fossa, vertebral arteries, paraspinal tissues: Known adenopathy at the thoracic inlet. Disc levels: C4-5 small central disc protrusion with mild cord deformity. C5-6 disc narrowing with bulging and ridging causing biforaminal impingement and ventral cord flattening. C6-7 left paracentral protrusion and uncovertebral ridging left foraminal impingement. Central disc herniation flattens the ventral cord. Thoracic spine: Alignment: Normal Vertebrae: Innumerable metastases seen at every level within most confluent disease seen in the T1, T2, T8, T9, T11, and T12 vertebral bodies. Epidural extension in the right canal and foramen at T1-2 is noted above. No  other epidural metastases are seen. Cord: Cord edema related to cervical spine deposit. There is Vashaun Osmon 4 mm enhancing nodule along the upper right cauda equina at the level of L1. Paraspinal tissues: Known mediastinal/left hilar adenopathy/mass. Disc levels: Spondylosis with bridging anterior osteophytes. There are small posterior disc herniations without cord compression. IMPRESSION: 1. 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 Extensive vasogenic edema in the cord. 2. 4 mm nodule along the upper right cauda equina at the level of L1, which may indicate subarachnoid tumor spread. 3. Innumerable metastases throughout the cervical and thoracic spine most notable at T1-2 where there is right-sided epidural and foraminal infiltration. 4. C5-6 and C6-7 degenerative spinal stenosis with cord flattening likely accentuated by the edema. Electronically Signed   By: Monte Fantasia  M.D.   On: 05/22/2019 11:30   Mr Thoracic Spine W Wo Contrast  Result Date: 05/16/2019 CLINICAL DATA:  Worsening radicular pain in the left arm. EXAM: MRI THORACIC WITHOUT AND WITH CONTRAST TECHNIQUE: Multiplanar and multiecho pulse sequences of the thoracic spine were obtained without and with intravenous contrast. CONTRAST:  10 cc Gadavist intravenous COMPARISON:  12/01/2018 FINDINGS: MRI THORACIC SPINE FINDINGS Alignment:  No abnormal alignment. Vertebrae: Osseous metastatic disease which is essentially confluent in the C6, C7, T1, and T2 vertebrae, progressive from prior. Metastases also seen in the T3 spinous process and T4 through T11 vertebral bodies, most extensive in the T9 vertebral body. Cord: 11 mm mass in the spinal canal at the level of C7-T1. This appears intradural on axial postcontrast imaging but there is very limited assessment due to motion artifact. Cord edema is seen extending inferiorly. No contiguous mass is seen in the epidural space at this level. Paraspinal and other soft tissues: Left perihilar mass with mediastinal  adenopathy. This is Lizandro Spellman significant change when compared to chest CT June 2020 Disc levels: Spondylosis and lower thoracic disc degeneration. No significant degenerative findings. IMPRESSION: 1. 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 Before any treatment suggest enhanced cervical MRI after symptom control. 2. Widespread osseous metastatic disease with progression from March 2020. 3. Thoracic progression from chest CT June 2020. Electronically Signed   By: Monte Fantasia M.D.   On: 05/16/2019 22:37   Ct Abdomen Pelvis W Contrast  Result Date: 05/20/2019 CLINICAL DATA:  Metastatic small cell carcinoma EXAM: CT CHEST, ABDOMEN, AND PELVIS WITH CONTRAST TECHNIQUE: Multidetector CT imaging of the chest, abdomen and pelvis was performed following the standard protocol during bolus administration of intravenous contrast. CONTRAST:  13m OMNIPAQUE IOHEXOL 300 MG/ML  SOLN COMPARISON:  MRI 05/16/2019, [id number], CT chest abdomen pelvis 03/03/2019, 12/01/2018 FINDINGS: CT CHEST FINDINGS Cardiovascular: Nonaneurysmal aorta. Mild aortic atherosclerosis. Coronary vascular calcification. Normal heart size. Small pericardial effusion. Tumor in case mint of left upper lobe pulmonary arterial vessels which appears significantly narrowed. Mediastinum/Nodes: Incompletely visualized left supraclavicular nodes. Progression of mediastinal adenopathy, now with multiple bulky enlarged lymph nodes. Right upper paratracheal lymph node measures 17 mm, not previously measured. Right paratracheal lymph node, series 2, image number 20 measures 25 mm. Substantially increased AP window node, measuring 23 mm, compared with 13 mm previously. Multiple additional paratracheal and precarinal nodes. Esophagus within normal limits. Lungs/Pleura: Irregular left upper lobe lung mass, significant progression compared to prior exam,  contiguous with bulky left hilar/suprahilar mass. This measures approximately 5.6 cm AP by 4.6 transverse by 5 cm craniocaudad though precise measurements difficult as the mass is contiguous with mediastinal adenopathy. Interim development of small left-sided pleural effusion. 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 Musculoskeletal: Known widespread skeletal metastatic disease. Partially visualized sclerosis at C7 with possible pathologic fracture at the junction of the lamina with the spinous process. Sclerotic metastatic lesion at right T2 vertebral body and posterior elements with pathologic superior endplate fracture at T2. Metastatic lesions at T9, T10 and T11. New soft tissue mass surrounding the right ninth posterior rib, consistent with metastatic disease. CT ABDOMEN PELVIS FINDINGS Hepatobiliary: Innumerable hypodense metastatic lesions within the liver, progressed compared to prior. Yi Falletta dominant left lobe lesion measures 7.2 cm by 5.7 cm, and may correspond to Hillel Card previously measured lesion in the  region which measured 3 x 18 mm. The gallbladder is contracted. No calcified stone. No biliary dilatation. Pancreas: Unremarkable. No pancreatic ductal dilatation or surrounding inflammatory changes. Spleen: Normal in size without focal abnormality. Adrenals/Urinary Tract: Progressed nodular thickening of the left greater than right adrenal glands, also concerning for progression of disease. Kidneys show no hydronephrosis. Subcentimeter hypodensity in the mid right kidney, too small to further characterize. The bladder is normal Stomach/Bowel: The stomach is nonenlarged. No dilated small bowel. No colon wall thickening. Vascular/Lymphatic: Moderate aortic atherosclerosis without aneurysm. Numerous enlarged porta hepatis, peripancreatic, portal caval and aortocaval nodes. Enlarged Peri aortic lymph nodes, measuring up to 2 cm, all progressed  compared to prior. Reproductive: Prostate is unremarkable. Other: Negative for free air or free fluid. Small within the inguinal canals. Musculoskeletal: Stable sclerotic focus superior endplate L5. Stable faint sclerosis in the left iliac bone, adjacent lytic lesion is evident. IMPRESSION: 1. Large irregular left upper lobe mass, extending into the left hilar and suprahilar regions, consistent with disease recurrence. The mass invades the mediastinum and there is encasement of the left pulmonary arterial vessels by the soft tissue mass. 2. Significant progression of metastatic disease as evidence by worsened mediastinal adenopathy, hepatic metastatic disease, and upper abdominal and retroperitoneal adenopathy. Increased nodularity of the left greater than right adrenal glands, also concerning for progression of metastatic disease. 3. New small left pleural effusion, with interim finding of multiple left pleural based nodules, consistent with pleural metastatic disease. 4. Skeletal metastatic disease with spinal lesions better seen on MRI. Pathologic superior endplate fracture at T2. New soft tissue mass surrounding the right posterior ninth rib, also consistent with metastatic disease. Electronically Signed   By: Donavan Foil M.D.   On: 05/20/2019 23:52    Microbiology: Recent Results (from the past 240 hour(s))  SARS CORONAVIRUS 2 Nasal Swab Aptima Multi Swab     Status: None   Collection Time: 05/17/19  1:26 AM   Specimen: Aptima Multi Swab; Nasal Swab  Result Value Ref Range Status   SARS Coronavirus 2 NEGATIVE NEGATIVE Final    Comment: (NOTE) SARS-CoV-2 target nucleic acids are NOT DETECTED. The SARS-CoV-2 RNA is generally detectable in upper and lower respiratory specimens during the acute phase of infection. Negative results do not preclude SARS-CoV-2 infection, do not rule out co-infections with other pathogens, and should not be used as the sole basis for treatment or other patient  management decisions. Negative results must be combined with clinical observations, patient history, and epidemiological information. The expected result is Negative. Fact Sheet for Patients: SugarRoll.be Fact Sheet for Healthcare Providers: https://www.woods-mathews.com/ This test is not yet approved or cleared by the [place] FDA and  has been authorized for detection and/or diagnosis of SARS-CoV-2 by FDA under an Emergency Use Authorization (EUA). This EUA will remain  in effect (meaning this test can be used) for the duration of the COVID-19 declaration under Section 56 4(b)(1) of the Act, 21 U.S.C. section 360bbb-3(b)(1), unless the authorization is terminated or revoked sooner. Performed at [facility], [place] [address] [place], [place] [address]      Labs: Basic Metabolic Panel: Recent Labs  Lab 05/18/19 [phone]  05/20/19 0550 05/21/19 0516 05/22/19 0519 05/23/19 0538 05/24/19 0600  NA 134*   < > 133* 132* 131* 131* 132*  K 5.4*   < > 4.2 4.1 4.4 4.3 4.4  CL 100   < > 98 98 99 99 97*  CO2 24   < > 25  25 22 22 24   GLUCOSE 88   < > 123* 112* 114* 108* 106*  BUN 13   < > 12 13 19 17 17   CREATININE 0.56*   < > 0.68 0.45* 0.57* 0.56* 0.72  CALCIUM 8.7*   < > 9.5 9.0 9.1 8.6* 8.9  MG 2.1  --   --  1.8  --  2.2 2.2   < > = values in this interval not displayed.   Liver Function Tests: Recent Labs  Lab 05/18/19 0704 05/21/19 0516 05/22/19 0519 05/23/19 0538 05/24/19 0600  AST 40 37 44* 43* 55*  ALT 31 41 53* 50* 65*  ALKPHOS 87 75 88 78 97  BILITOT 0.9 0.9 1.1 1.1 1.3*  PROT 7.2 7.1 7.5 6.5 7.3  ALBUMIN 3.8 3.7 3.9 3.6 3.9   No results for input(s): LIPASE, AMYLASE in the last 168 hours. No results for input(s): AMMONIA in the last 168 hours. CBC: Recent Labs  Lab 05/18/19 0704 05/21/19 0516 05/22/19 0519 05/23/19 0538 05/24/19 0600  WBC 4.8 6.4 5.6 7.1 12.2*  NEUTROABS 3.2  --   --   --   --   HGB  13.5 13.9 15.0 13.4 14.8  HCT 39.8 41.1 44.1 40.1 43.7  MCV 98.0 95.1 95.5 96.9 97.1  PLT 145* 171 151 168 196   Cardiac Enzymes: No results for input(s): CKTOTAL, CKMB, CKMBINDEX, TROPONINI in the last 168 hours. BNP: BNP (last 3 results) No results for input(s): BNP in the last 8760 hours.  ProBNP (last 3 results) No results for input(s): PROBNP in the last 8760 hours.  CBG: Recent Labs  Lab 05/21/19 0756  GLUCAP 97       Signed:  Fayrene Helper MD.  [facility] 05/24/2019, 3:34 PM

## 2019-05-25 ENCOUNTER — Encounter: Payer: Self-pay | Admitting: Hospice and Palliative Medicine

## 2019-05-25 ENCOUNTER — Telehealth: Payer: Self-pay | Admitting: Family Medicine

## 2019-05-25 ENCOUNTER — Other Ambulatory Visit: Payer: Self-pay | Admitting: Hematology

## 2019-05-25 ENCOUNTER — Other Ambulatory Visit (HOSPITAL_COMMUNITY): Payer: Self-pay | Admitting: Hospice and Palliative Medicine

## 2019-05-25 ENCOUNTER — Encounter (HOSPITAL_COMMUNITY): Payer: Self-pay | Admitting: Radiology

## 2019-05-25 ENCOUNTER — Encounter: Payer: Self-pay | Admitting: *Deleted

## 2019-05-25 ENCOUNTER — Other Ambulatory Visit: Payer: Self-pay | Admitting: Hospice and Palliative Medicine

## 2019-05-25 ENCOUNTER — Telehealth: Payer: Self-pay | Admitting: *Deleted

## 2019-05-25 MED ORDER — MORPHINE SULFATE 15 MG PO TABS: 15.0000 mg | ORAL_TABLET | ORAL | 0 refills | Status: DC | PRN

## 2019-05-25 MED ORDER — DEXAMETHASONE 4 MG PO TABS: 4.0000 mg | ORAL_TABLET | Freq: Every day | ORAL | 0 refills | Status: DC

## 2019-05-25 MED ORDER — DEXAMETHASONE 4 MG PO TABS: 4.0000 mg | ORAL_TABLET | Freq: Two times a day (BID) | ORAL | 0 refills | Status: DC

## 2019-05-25 MED ORDER — OXYCODONE HCL ER 40 MG PO T12A: 40.0000 mg | EXTENDED_RELEASE_TABLET | Freq: Two times a day (BID) | ORAL | 0 refills | Status: AC

## 2019-05-25 MED ORDER — MORPHINE SULFATE 15 MG PO TABS: 15.0000 mg | ORAL_TABLET | ORAL | 0 refills | Status: AC | PRN

## 2019-05-25 MED FILL — OxyCONTIN 40 MG T12A: 40 | 30 days supply | Qty: 60 | Fill #0

## 2019-05-25 MED FILL — DEXAMETHASONE 4 MG TABLET: 4 | 30 days supply | Qty: 30 | Fill #0

## 2019-05-25 MED FILL — MORPHINE SULFATE IR 15 MG T: 15 | 3 days supply | Qty: 24 | Fill #0

## 2019-05-25 NOTE — Progress Notes (Signed)
Patient called and medication not available at pharmacy where it was originally sent.  Represcribed and dropped off for him to pick up at [facility] outpatient pharmacy.

## 2019-05-25 NOTE — Telephone Encounter (Signed)
Spoke with patient - his pharmacy ([facility])  is ordering his new prescriptions of pain medications prescribed by Dr.Powell and hopes to have them tomorrow. The prescriptions are for 3 days each.  Dr. Domingo Cocking wrote a new 3 day prescription for MS IR 15 mg a little while ago and took it to [facility] so he will have some type of medicine. He still has a few days left of previously prescribed medication. He will look at them and see how many days of medication he has and contact the office tomorrow. Dr. Irene Limbo given all information.

## 2019-05-25 NOTE — Telephone Encounter (Signed)
Patient called, left voice mail. Was discharged from hospital yesterday. He said he had not spoken with Dr. Irene Limbo in a few days and would like to have a few minutes of his  time. He also states Dr. Florene Glen (from hospital) called in some pain medicine for him but the pharmacy won't have it until tomorrow, so that's another concern. Attempted to contact patient - left voice mail that Dr. Irene Limbo will be informed that patient wants to speak with him. Asked him to call office to discuss acquisition of pain medication/

## 2019-05-25 NOTE — Progress Notes (Signed)
[place] Work  Clinical Social Work received referral from Art therapist for financial needs.  CSW contacted patient at home to offer support and assess for needs.  Patient stated he applied and was approved for social security disability in March.  Patient also stated he saw a financial advocate while in the hospital and is "re-filing" for Medicaid.  CSW and patient discussed support services at [facility].  Patient plan to see CSW after his appointment on 05/26/2019 to apply for the [facility] and discuss additional resources.     Johnnye Lana, MSW, LCSW, OSW-C Clinical Social Worker [facility] [phone]

## 2019-05-25 NOTE — Progress Notes (Signed)
Oncology Nurse Navigator Documentation  Oncology Nurse Navigator Flowsheets 05/25/2019  Navigator Location [facility]  Navigator Encounter Type Other/I received a call from MD stating Cody Montoya needs some financial help paying for his pain medication.  I reached out to [employer] and CSW to see what resources are available to him.   Patient Visit Type -  Barriers/Navigation Needs Financial  Interventions Referrals  Referrals Financial Counseling;Social Work  Acuity Level 3  Time Spent with Patient 30

## 2019-05-25 NOTE — Telephone Encounter (Signed)
Received call from Dr. Domingo Cocking Pt unable to pick up MSIR from [facility] and requesting this be sent to [facility] at [facility]. Attempted to send this Rx, but unfortunately, as he's been d/c'd and it's greater than 2 hrs after d/c, not able to send order and as it's a controlled substance, I can't call this in. Relayed information to Dr. Domingo Cocking who is going to try to get Cody Montoya a paper rx.

## 2019-05-26 ENCOUNTER — Other Ambulatory Visit: Payer: Self-pay

## 2019-05-26 ENCOUNTER — Other Ambulatory Visit: Payer: Self-pay | Admitting: Hospice and Palliative Medicine

## 2019-05-26 ENCOUNTER — Ambulatory Visit
Admission: RE | Admit: 2019-05-26 | Discharge: 2019-05-26 | Disposition: A | Discharge status: Home / Self Care | Payer: Medicaid Other | Source: Ambulatory Visit | Attending: Radiation Oncology | Admitting: Radiation Oncology

## 2019-05-26 DIAGNOSIS — Z510 Encounter for antineoplastic radiation therapy: Secondary | ICD-10-CM | POA: Insufficient documentation

## 2019-05-26 DIAGNOSIS — Z299 Encounter for prophylactic measures, unspecified: Secondary | ICD-10-CM | POA: Diagnosis not present

## 2019-05-26 DIAGNOSIS — F17210 Nicotine dependence, cigarettes, uncomplicated: Secondary | ICD-10-CM | POA: Insufficient documentation

## 2019-05-26 DIAGNOSIS — C3412 Malignant neoplasm of upper lobe, left bronchus or lung: Secondary | ICD-10-CM | POA: Insufficient documentation

## 2019-05-27 ENCOUNTER — Other Ambulatory Visit: Payer: Self-pay

## 2019-05-27 ENCOUNTER — Ambulatory Visit
Admission: RE | Admit: 2019-05-27 | Discharge: 2019-05-27 | Disposition: A | Discharge status: Home / Self Care | Payer: Medicaid Other | Source: Ambulatory Visit | Attending: Radiation Oncology | Admitting: Radiation Oncology

## 2019-05-27 DIAGNOSIS — Z510 Encounter for antineoplastic radiation therapy: Secondary | ICD-10-CM | POA: Diagnosis not present

## 2019-05-28 ENCOUNTER — Ambulatory Visit
Admission: RE | Admit: 2019-05-28 | Discharge: 2019-05-28 | Disposition: A | Discharge status: Home / Self Care | Payer: Medicaid Other | Source: Ambulatory Visit | Attending: Radiation Oncology | Admitting: Radiation Oncology

## 2019-05-28 ENCOUNTER — Other Ambulatory Visit: Payer: Self-pay

## 2019-05-28 ENCOUNTER — Telehealth: Payer: Self-pay | Admitting: *Deleted

## 2019-05-28 ENCOUNTER — Other Ambulatory Visit: Payer: Self-pay | Admitting: Hematology

## 2019-05-28 ENCOUNTER — Inpatient Hospital Stay: Payer: Medicaid Other | Admitting: Internal Medicine

## 2019-05-28 DIAGNOSIS — Z510 Encounter for antineoplastic radiation therapy: Secondary | ICD-10-CM | POA: Diagnosis not present

## 2019-05-28 MED FILL — MORPHINE SULFATE IR 15 MG T: 15 | 11 days supply | Qty: 90 | Fill #0

## 2019-05-28 MED FILL — ONDANSETRON ODT 4 MG TABLET: 4 | 7 days supply | Qty: 28 | Fill #0

## 2019-05-28 NOTE — Telephone Encounter (Signed)
Called patient notifying him of refill sent 05-25-2019.  "Will call to see if it's ready and pick up tomorrow when there for treatment."

## 2019-05-28 NOTE — Telephone Encounter (Signed)
Patient called - left voice mail regarding prescription for Morphine (MSIR), 15 MG tablet, #90.  Attempted to contact patient to inform that prescription was sent to [facility]. Information given to [employer] (on pt DRP) who stated patient was sleeping. Ms. Chevez acknowledged information r/t prescription. Ms. Rorke voice several concerns - patient is eating very little and is not drinking much (less than 8 oz/day per Ms. Lippard). She states patient vomits every day following radiation therapy. Advised her that patient should report any symptoms following radiation to Dr.Moody. She states she will encourage him to do so.  Asked if ondansetron helped patient's nausea. Ms.Merced reported that it was too expensive from the pharmacy and that he doesn't have any. She reports that Shirley,NP with [facility] was trying to help him obtain this med.  Ms. Vilardi asked about the hospital bed and bedside commode that they were told was ordered on discharge from hospital.

## 2019-05-29 ENCOUNTER — Inpatient Hospital Stay: Payer: Medicaid Other | Admitting: Internal Medicine

## 2019-05-29 ENCOUNTER — Encounter (HOSPITAL_COMMUNITY): Payer: Self-pay | Admitting: General Practice

## 2019-05-29 ENCOUNTER — Inpatient Hospital Stay: Payer: Medicaid Other | Attending: Hematology | Admitting: Internal Medicine

## 2019-05-29 ENCOUNTER — Other Ambulatory Visit: Payer: Self-pay

## 2019-05-29 ENCOUNTER — Ambulatory Visit
Admission: RE | Admit: 2019-05-29 | Discharge: 2019-05-29 | Disposition: A | Discharge status: Home / Self Care | Payer: Medicaid Other | Source: Ambulatory Visit | Attending: Radiation Oncology | Admitting: Radiation Oncology

## 2019-05-29 ENCOUNTER — Telehealth: Payer: Self-pay | Admitting: *Deleted

## 2019-05-29 ENCOUNTER — Other Ambulatory Visit: Payer: Self-pay | Admitting: Hematology

## 2019-05-29 VITALS — BP 123/76 | HR 80 | Temp 99.4°F | Resp 18 | Ht 70.0 in | Wt 159.1 lb

## 2019-05-29 DIAGNOSIS — Z5111 Encounter for antineoplastic chemotherapy: Secondary | ICD-10-CM | POA: Insufficient documentation

## 2019-05-29 DIAGNOSIS — C787 Secondary malignant neoplasm of liver and intrahepatic bile duct: Secondary | ICD-10-CM | POA: Diagnosis not present

## 2019-05-29 DIAGNOSIS — C7951 Secondary malignant neoplasm of bone: Secondary | ICD-10-CM | POA: Insufficient documentation

## 2019-05-29 DIAGNOSIS — G9589 Other specified diseases of spinal cord: Secondary | ICD-10-CM

## 2019-05-29 DIAGNOSIS — C3412 Malignant neoplasm of upper lobe, left bronchus or lung: Secondary | ICD-10-CM | POA: Insufficient documentation

## 2019-05-29 DIAGNOSIS — C3490 Malignant neoplasm of unspecified part of unspecified bronchus or lung: Secondary | ICD-10-CM

## 2019-05-29 DIAGNOSIS — C7931 Secondary malignant neoplasm of brain: Secondary | ICD-10-CM

## 2019-05-29 DIAGNOSIS — Z510 Encounter for antineoplastic radiation therapy: Secondary | ICD-10-CM | POA: Diagnosis not present

## 2019-05-29 DIAGNOSIS — Z79899 Other long term (current) drug therapy: Secondary | ICD-10-CM | POA: Diagnosis not present

## 2019-05-29 DIAGNOSIS — G893 Neoplasm related pain (acute) (chronic): Secondary | ICD-10-CM

## 2019-05-29 MED ORDER — DEXAMETHASONE 1 MG PO TABS: ORAL_TABLET | ORAL | 0 refills | Status: AC

## 2019-05-29 MED FILL — DEXAMETHASONE 1 MG TABLET: 1 | 10 days supply | Qty: 15 | Fill #0

## 2019-05-29 NOTE — Progress Notes (Signed)
[facility] [place] [facility] [place], [place] [address] [phone]   Interval Evaluation  Date of Service: 05/29/19 Patient Name: Cody Montoya [facility] Patient MRN: [id number] Patient DOB: 1956-05-12 Provider: Ventura Sellers, MD  Identifying Statement:  Cody Montoya is a 63 y.o. 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[place])   12/15/2018 -  Chemotherapy   The patient had palonosetron (ALOXI) injection 0.25 mg, 0.25 mg, Intravenous,  Once, 4 of 4 cycles Administration: 0.25 mg (12/15/2018), 0.25 mg (01/05/2019), 0.25 mg (01/26/2019), 0.25 mg (02/17/2019) pegfilgrastim (NEULASTA ONPRO KIT) injection 6 mg, 6 mg, Subcutaneous, Once, 3 of 3 cycles Administration: 6 mg (01/07/2019), 6 mg (01/28/2019), 6 mg (02/19/2019) pegfilgrastim-cbqv (UDENYCA) injection 6 mg, 6 mg, Subcutaneous, Once, 1 of 1 cycle Administration: 6 mg (12/16/2018) CARBOplatin (PARAPLATIN) 340 mg in sodium chloride 0.9 % 250 mL chemo infusion, 340 mg (100 % of original dose 335.4 mg), Intravenous,  Once, 4 of 4 cycles Dose modification: 335.4 mg (original dose 335.4 mg, Cycle 1),   (original dose 590.4 mg, Cycle 2), 700 mg (original dose 590.4 mg, Cycle 4, Reason: Provider Judgment) Administration: 340 mg (12/15/2018), 550 mg (01/05/2019), 550 mg (01/26/2019), 700 mg (02/17/2019) etoposide (VEPESID) 160 mg in sodium chloride 0.9 % 500 mL chemo infusion, 75 mg/m2 = 160 mg (100 % of original dose 75 mg/m2), Intravenous,  Once, 4 of 4 cycles Dose modification: 75 mg/m2 (original dose 75 mg/m2, Cycle 1, Reason: Provider Judgment), 50 mg/m2 (original dose 50 mg/m2, Cycle 2, Reason: Change in LFTs) Administration: 160 mg (12/15/2018), 110 mg (01/05/2019), 110 mg (01/07/2019), 110  mg (01/26/2019), 110 mg (01/28/2019), 110 mg (02/17/2019), 110 mg (02/19/2019), 110 mg (01/06/2019), 110 mg (01/27/2019), 110 mg (02/18/2019) fosaprepitant (EMEND) 150 mg, dexamethasone (DECADRON) 12 mg in sodium chloride 0.9 % 145 mL IVPB, , Intravenous,  Once, 4 of 4 cycles Administration:  (12/15/2018),  (01/05/2019),  (01/26/2019),  (02/17/2019) atezolizumab (TECENTRIQ) 1,200 mg in sodium chloride 0.9 % 250 mL chemo infusion, 1,200 mg, Intravenous, Once, 7 of 8 cycles Administration: 1,200 mg (01/05/2019), 1,200 mg (01/26/2019), 1,200 mg (02/17/2019), 1,200 mg (03/09/2019), 1,200 mg (03/30/2019), 1,200 mg (04/20/2019), 1,200 mg (05/11/2019)  for chemotherapy treatment.      Interval History:  Cody Montoya presents to clinic for follow up, now undergoing radiotherapy for multiple symptomatic spine metastases.  He describes significant improvement in midline and radiating back pain since start of radiation last week.  He continues to take standing morphine every 3 hours, although he feels overall need for pain medicine is lessened.  He feels "a little unsteady" walking but overall is independent and not requiring assistance.  Chest wall numbness persists but not worsened from prior.  Continues to take decadron '4mg'$  daily since discharge.    H+P (05/07/19) Patient presents today as referral from Dr. Irene Limbo for new neurologic symptoms.  The patient describes numbness of chest well, affecting the left side, wrapping around front to back and stopping right at the midline.  It is difficult to pinpoint the exact level of sensory loss, it seems to primarily affect the lower abdominal region, although he feels "weird" all the way up his armpit at times.  These symptoms have been persistent for several weeks.  In addition, he described recurrence  of midline (upper) back pain that had initially resolved completely after completing chemotherapy (reduced dose carbo+etop in may).  This pain is localized and non-radiating.  He is  walking ok on his own, but feels a little increase in "unsteadiness" at times, which is also new.  No issues with urinary urgency. Presently he is undergoing whole brain radiation after several small brain mets were uncovered on screening MRI.   Medications: Current Outpatient Medications on File Prior to Visit  Medication Sig Dispense Refill   acetaminophen (TYLENOL) 500 MG tablet Take 2 tablets (1,000 mg total) by mouth every 8 (eight) hours as needed for moderate pain. 30 tablet 0   B Complex Vitamins SOLN Take 1 mL by mouth daily.     Calcium 600-200 MG-UNIT tablet Take 1 tablet by mouth daily.     Cholecalciferol (VITAMIN D) 50 MCG (2000 UT) CAPS Take 2,000 Units by mouth daily.     dexamethasone (DECADRON) 4 MG tablet Take 1 tablet (4 mg total) by mouth daily. 30 tablet 0   dimenhyDRINATE (DRAMAMINE) 50 MG tablet Take 50 mg by mouth every 8 (eight) hours as needed for dizziness.     docusate sodium (COLACE) 100 MG capsule Take 1 capsule (100 mg total) by mouth 2 (two) times daily. (Patient taking differently: Take 200 mg by mouth daily. ) 10 capsule 0   LORazepam (ATIVAN) 0.5 MG tablet 1 tab po q 4-6 hours prn or 1 tab po 30 minutes prior to MRI or radiation (Patient taking differently: Take 0.5 mg by mouth every 6 (six) hours as needed for anxiety or sedation. ) 10 tablet 0   memantine (NAMENDA) 5 MG tablet Start on first day of XRT: Week 1: take 1 tablet po q am Week 2: take 1 tablet po q am and 1 tablet po q pm Week 3: take 2 tablets po q am, and 1 tablets po q pm Week 4-24: take 2 tablets po q am, and 2 tablets po q pm.  Stop after 24 weeks 10/09/2019 should be last day 120 tablet 3   Menthol (BIOFREEZE) 10.5 % AERO Apply 1 application topically every 4 (four) hours as needed (pain).     morphine (MSIR) 15 MG tablet Take 1 tablet (15 mg total) by mouth every 3 (three) hours as needed for moderate pain or severe pain. 90 tablet 0   Multiple Vitamin (MULTIVITAMIN WITH MINERALS)  TABS tablet Take 1 tablet by mouth daily.     ondansetron (ZOFRAN) 8 MG tablet Take 1 tablet (8 mg total) by mouth 2 (two) times daily as needed for refractory nausea / vomiting. Start on day 3 after carboplatin chemo. 30 tablet 1   oxyCODONE (OXYCONTIN) 40 mg 12 hr tablet Take 1 tablet (40 mg total) by mouth every 12 (twelve) hours. 60 tablet 0   polyethylene glycol (MIRALAX / GLYCOLAX) packet Take 17 g by mouth daily as needed for mild constipation. (Patient taking differently: Take 17 g by mouth daily. ) 14 each 0   senna-docusate (SENOKOT-S) 8.6-50 MG tablet Take 2 tablets by mouth 2 (two) times daily. 120 tablet 0   No current facility-administered medications on file prior to visit.     Allergies: No Known Allergies Past Medical History:  Past Medical History:  Diagnosis Date   Cancer [facility])    Past Surgical History:  Past Surgical History:  Procedure Laterality Date   NO PAST SURGERIES     RADIOLOGY WITH ANESTHESIA N/A 05/22/2019   Procedure: MRI  CERVICAL SPINE AND THORACIC SPINE WITH AND WITHOUT CONTRAST, WITH ANESTHESIA;  Surgeon: Radiologist, Medication, MD;  Location: [facility];  Service: Radiology;  Laterality: N/A;   Social History:  Social History   Socioeconomic History   Marital status: Single    Spouse name: Not on file   Number of children: Not on file   Years of education: Not on file   Highest education level: Not on file  Occupational History   Occupation: Surveyor, quantity  Social Needs   Financial resource strain: Not very hard   Food insecurity    Worry: Never true    Inability: Never true   Transportation needs    Medical: No    Non-medical: No  Tobacco Use   Smoking status: Current Every Day Smoker    Packs/day: 1.00    Years: 45.00    Pack years: 45.00    Types: Cigarettes   Smokeless tobacco: Never Used  Substance and Sexual Activity   Alcohol use: Not Currently    Alcohol/week: 8.0 standard drinks    Types: 3 Cans of beer,  5 Shots of liquor per week    Comment: no alcohol in past month due to symptoms   Drug use: Not Currently    Frequency: 7.0 times per week    Types: Marijuana   Sexual activity: Not Currently  Lifestyle   Physical activity    Days per week: 5 days    Minutes per session: 150+ min   Stress: To some extent  Relationships   Social connections    Talks on phone: More than three times a week    Gets together: More than three times a week    Attends religious service: Never    Active member of club or organization: No    Attends meetings of clubs or organizations: Never    Relationship status: Living with partner   Intimate partner violence    Fear of current or ex partner: No    Emotionally abused: No    Physically abused: No    Forced sexual activity: No  Other Topics Concern   Not on file  Social History Narrative   Not on file   Family History:  Family History  Problem Relation Age of Onset   Breast cancer Sister    Breast cancer Sister     Review of Systems: Constitutional: Denies fevers, chills or abnormal weight loss Eyes: Denies blurriness of vision Ears, nose, mouth, throat, and face: Denies mucositis or sore throat Respiratory: Denies cough, dyspnea or wheezes Cardiovascular: Denies palpitation, chest discomfort or lower extremity swelling Gastrointestinal:  Denies nausea, constipation, diarrhea GU: Denies dysuria or incontinence Skin: Denies abnormal skin rashes Neurological: Per HPI Musculoskeletal: Denies joint pain, back or neck discomfort. No decrease in ROM Behavioral/Psych: Denies anxiety, disturbance in thought content, and mood instability   Physical Exam: Vitals:   05/29/19 1508  BP: 123/76  Pulse: 80  Resp: 18  Temp: 99.4 F (37.4 C)  SpO2: 97%   KPS: 80. General: Alert, cooperative, pleasant, in no acute distress Head: Craniotomy scar noted, dry and intact. EENT: No conjunctival injection or scleral icterus. Oral mucosa  moist Lungs: Resp effort normal Cardiac: Regular rate and rhythm Abdomen: Soft, non-distended abdomen Skin: No rashes cyanosis or petechiae. Extremities: No clubbing or edema  Neurologic Exam: Mental Status: Awake, alert, attentive to examiner. Oriented to self and environment. Language is fluent with intact comprehension.  Cranial Nerves: Visual acuity is grossly normal. Visual fields are full.  Extra-ocular movements intact. No ptosis. Face is symmetric, tongue midline. Motor: Tone and bulk are normal. Power is full in both arms and legs. Reflexes are symmetric, no pathologic reflexes present. Intact finger to nose bilaterally Sensory: Impaired to light touch, left chest wall and back, primarily umbilical Gait: Normal and tandem gait is normal.   Labs: I have reviewed the data as listed    Component Value Date/Time   NA 132 (L) 05/24/2019 0600   K 4.4 05/24/2019 0600   CL 97 (L) 05/24/2019 0600   CO2 24 05/24/2019 0600   GLUCOSE 106 (H) 05/24/2019 0600   BUN 17 05/24/2019 0600   CREATININE 0.72 05/24/2019 0600   CREATININE 0.79 05/11/2019 0909   CALCIUM 8.9 05/24/2019 0600   PROT 7.3 05/24/2019 0600   ALBUMIN 3.9 05/24/2019 0600   AST 55 (H) 05/24/2019 0600   AST 35 05/11/2019 0909   ALT 65 (H) 05/24/2019 0600   ALT 24 05/11/2019 0909   ALKPHOS 97 05/24/2019 0600   BILITOT 1.3 (H) 05/24/2019 0600   BILITOT 0.7 05/11/2019 0909   GFRNONAA >60 05/24/2019 0600   GFRNONAA >60 05/11/2019 0909   GFRAA >60 05/24/2019 0600   GFRAA >60 05/11/2019 0909   Lab Results  Component Value Date   WBC 12.2 (H) 05/24/2019   NEUTROABS 3.2 05/18/2019   HGB 14.8 05/24/2019   HCT 43.7 05/24/2019   MCV 97.1 05/24/2019   PLT 196 05/24/2019    Imaging:  Ct Soft Tissue Neck W Contrast  Result Date: 05/20/2019 CLINICAL DATA:  Metastatic small cell carcinoma of the lung. Known brain and spinal metastases. Undergoing radiation therapy. EXAM: CT NECK WITH CONTRAST TECHNIQUE: Multidetector CT  imaging of the neck was performed using the standard protocol following the bolus administration of intravenous contrast. CONTRAST:  121m OMNIPAQUE IOHEXOL 300 MG/ML  SOLN COMPARISON:  None. FINDINGS: PHARYNX AND LARYNX: --Nasopharynx: Fossae of Rosenmuller are clear. Normal adenoid tonsils for age. --Oral cavity and oropharynx: The palatine and lingual tonsils are normal. The visible oral cavity and floor of mouth are normal. --Hypopharynx: Normal vallecula and pyriform sinuses. --Larynx: Normal epiglottis and pre-epiglottic space. Normal aryepiglottic and vocal folds. --Retropharyngeal space: No abscess, effusion or lymphadenopathy. SALIVARY GLANDS: --Parotid: No mass lesion or inflammation. No sialolithiasis or ductal dilatation. --Submandibular: Symmetric without inflammation. No sialolithiasis or ductal dilatation. --Sublingual: Normal. No ranula or other visible lesion of the base of tongue and floor of mouth. THYROID: Normal. LYMPH NODES: No enlarged or abnormal density cervical lymph nodes. VASCULAR: Major cervical vessels are patent. LIMITED INTRACRANIAL: Normal. VISUALIZED ORBITS: Normal. MASTOIDS AND VISUALIZED PARANASAL SINUSES: No fluid levels or advanced mucosal thickening. No mastoid effusion. SKELETON: Metastatic lesions are again demonstrated within the vertebral bodies at C6 and C7. No pathologic fracture. There are multiple upper thoracic metastases as well. There are age-indeterminate fractures of the spinous processes at C6 and C7. UPPER CHEST: Mediastinal lymphadenopathy and left suprahilar lung mass are better characterized on the concomitant CT of the chest. OTHER: Low-attenuation subcutaneous left submental lesion is likely a sebaceous cyst. IMPRESSION: 1. No soft tissue mass or lymphadenopathy of the neck. 2. Multiple osseous metastatic lesions of the cervical and thoracic spine, better characterized on the recent thoracic spine MRI. 3. Age-indeterminate fractures of the spinous processes  at C6 and C7. 4. Mediastinal lymphadenopathy and left suprahilar lung mass are better characterized on the concomitant CT of the chest. Electronically Signed   By: KUlyses JarredM.D.   On: 05/20/2019 23:37  Ct Chest W Contrast  Result Date: 05/20/2019 CLINICAL DATA:  Metastatic small cell carcinoma EXAM: CT CHEST, ABDOMEN, AND PELVIS WITH CONTRAST TECHNIQUE: Multidetector CT imaging of the chest, abdomen and pelvis was performed following the standard protocol during bolus administration of intravenous contrast. CONTRAST:  158m OMNIPAQUE IOHEXOL 300 MG/ML  SOLN COMPARISON:  MRI 05/16/2019, [id number], CT chest abdomen pelvis 03/03/2019, 12/01/2018 FINDINGS: CT CHEST FINDINGS Cardiovascular: Nonaneurysmal aorta. Mild aortic atherosclerosis. Coronary vascular calcification. Normal heart size. Small pericardial effusion. Tumor in case mint of left upper lobe pulmonary arterial vessels which appears significantly narrowed. Mediastinum/Nodes: Incompletely visualized left supraclavicular nodes. Progression of mediastinal adenopathy, now with multiple bulky enlarged lymph nodes. Right upper paratracheal lymph node measures 17 mm, not previously measured. Right paratracheal lymph node, series 2, image number 20 measures 25 mm. Substantially increased AP window node, measuring 23 mm, compared with 13 mm previously. Multiple additional paratracheal and precarinal nodes. Esophagus within normal limits. Lungs/Pleura: Irregular left upper lobe lung mass, significant progression compared to prior exam, contiguous with bulky left hilar/suprahilar mass. This measures approximately 5.6 cm AP by 4.6 transverse by 5 cm craniocaudad though precise measurements difficult as the mass is contiguous with mediastinal adenopathy. Interim development of small left-sided pleural effusion. 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 Musculoskeletal: Known widespread  skeletal metastatic disease. Partially visualized sclerosis at C7 with possible pathologic fracture at the junction of the lamina with the spinous process. Sclerotic metastatic lesion at right T2 vertebral body and posterior elements with pathologic superior endplate fracture at T2. Metastatic lesions at T9, T10 and T11. New soft tissue mass surrounding the right ninth posterior rib, consistent with metastatic disease. CT ABDOMEN PELVIS FINDINGS Hepatobiliary: Innumerable hypodense metastatic lesions within the liver, progressed compared to prior. A dominant left lobe lesion measures 7.2 cm by 5.7 cm, and may correspond to a previously measured lesion in the region which measured 3 x 18 mm. The gallbladder is contracted. No calcified stone. No biliary dilatation. Pancreas: Unremarkable. No pancreatic ductal dilatation or surrounding inflammatory changes. Spleen: Normal in size without focal abnormality. Adrenals/Urinary Tract: Progressed nodular thickening of the left greater than right adrenal glands, also concerning for progression of disease. Kidneys show no hydronephrosis. Subcentimeter hypodensity in the mid right kidney, too small to further characterize. The bladder is normal Stomach/Bowel: The stomach is nonenlarged. No dilated small bowel. No colon wall thickening. Vascular/Lymphatic: Moderate aortic atherosclerosis without aneurysm. Numerous enlarged porta hepatis, peripancreatic, portal caval and aortocaval nodes. Enlarged Peri aortic lymph nodes, measuring up to 2 cm, all progressed compared to prior. Reproductive: Prostate is unremarkable. Other: Negative for free air or free fluid. Small within the inguinal canals. Musculoskeletal: Stable sclerotic focus superior endplate L5. Stable faint sclerosis in the left iliac bone, adjacent lytic lesion is evident. IMPRESSION: 1. Large irregular left upper lobe mass, extending into the left hilar and suprahilar regions, consistent with disease recurrence. The  mass invades the mediastinum and there is encasement of the left pulmonary arterial vessels by the soft tissue mass. 2. Significant progression of metastatic disease as evidence by worsened mediastinal adenopathy, hepatic metastatic disease, and upper abdominal and retroperitoneal adenopathy. Increased nodularity of the left greater than right adrenal glands, also concerning for progression of metastatic disease. 3. New small left pleural effusion, with interim finding of multiple left pleural based nodules, consistent with pleural metastatic disease. 4. Skeletal metastatic disease with spinal lesions better seen on MRI. Pathologic superior endplate  fracture at T2. New soft tissue mass surrounding the right posterior ninth rib, also consistent with metastatic disease. Electronically Signed   By: Donavan Foil M.D.   On: 05/20/2019 23:52   Mr Cervical Spine W Wo Contrast  Result Date: 05/22/2019 CLINICAL DATA:  Metastatic disease. EXAM: MRI CERVICAL AND THORACIC SPINE WITHOUT AND WITH CONTRAST TECHNIQUE: Multiplanar and multiecho pulse sequences of the cervical and thoracic spine were obtained without and with intravenous contrast. CONTRAST:  8 cc Gadavist intravenous COMPARISON:  Thoracic MRI from 6 days ago. FINDINGS: Cervical spine: Alignment: Normal Vertebrae: Metastatic infiltration in the C2 spinous process, C4 body, C5 body, and nearly a confluent involvement of the C6-T2 vertebrae involving bodies and posterior elements. No pathologic fracture. There is epidural tumor extension along the right aspect of the canal at T1-2, which replaces the right foramen and contacts the cord. Cord: Ovoid mass within the cervical cord at the level of C7, 17 x 11 mm. Cord edema extends cranially and inferiorly throughout the cervical and upper thoracic cord. It is unclear if this is direct spread from the right foraminal disease at T1-2 or a hematogenous metastasis. Posterior Fossa, vertebral arteries, paraspinal tissues:  Known adenopathy at the thoracic inlet. Disc levels: C4-5 small central disc protrusion with mild cord deformity. C5-6 disc narrowing with bulging and ridging causing biforaminal impingement and ventral cord flattening. C6-7 left paracentral protrusion and uncovertebral ridging left foraminal impingement. Central disc herniation flattens the ventral cord. Thoracic spine: Alignment: Normal Vertebrae: Innumerable metastases seen at every level within most confluent disease seen in the T1, T2, T8, T9, T11, and T12 vertebral bodies. Epidural extension in the right canal and foramen at T1-2 is noted above. No other epidural metastases are seen. Cord: Cord edema related to cervical spine deposit. There is a 4 mm enhancing nodule along the upper right cauda equina at the level of L1. Paraspinal tissues: Known mediastinal/left hilar adenopathy/mass. Disc levels: Spondylosis with bridging anterior osteophytes. There are small posterior disc herniations without cord compression. IMPRESSION: 1. 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 Extensive vasogenic edema in the cord. 2. 4 mm nodule along the upper right cauda equina at the level of L1, which may indicate subarachnoid tumor spread. 3. Innumerable metastases throughout the cervical and thoracic spine most notable at T1-2 where there is right-sided epidural and foraminal infiltration. 4. C5-6 and C6-7 degenerative spinal stenosis with cord flattening likely accentuated by the edema. Electronically Signed   By: Monte Fantasia M.D.   On: 05/22/2019 11:30   Mr Thoracic Spine W Wo Contrast  Result Date: 05/22/2019 CLINICAL DATA:  Metastatic disease. EXAM: MRI CERVICAL AND THORACIC SPINE WITHOUT AND WITH CONTRAST TECHNIQUE: Multiplanar and multiecho pulse sequences of the cervical and thoracic spine were obtained without and with intravenous contrast. CONTRAST:  8 cc Gadavist intravenous COMPARISON:  Thoracic MRI from 6 days ago. FINDINGS: Cervical spine: Alignment:  Normal Vertebrae: Metastatic infiltration in the C2 spinous process, C4 body, C5 body, and nearly a confluent involvement of the C6-T2 vertebrae involving bodies and posterior elements. No pathologic fracture. There is epidural tumor extension along the right aspect of the canal at T1-2, which replaces the right foramen and contacts the cord. Cord: Ovoid mass within the cervical cord at the level of C7, 17 x 11 mm. Cord edema extends cranially and inferiorly throughout the cervical and upper thoracic cord. It is unclear if this is direct spread from the right foraminal disease at T1-2 or a hematogenous metastasis. Posterior  Fossa, vertebral arteries, paraspinal tissues: Known adenopathy at the thoracic inlet. Disc levels: C4-5 small central disc protrusion with mild cord deformity. C5-6 disc narrowing with bulging and ridging causing biforaminal impingement and ventral cord flattening. C6-7 left paracentral protrusion and uncovertebral ridging left foraminal impingement. Central disc herniation flattens the ventral cord. Thoracic spine: Alignment: Normal Vertebrae: Innumerable metastases seen at every level within most confluent disease seen in the T1, T2, T8, T9, T11, and T12 vertebral bodies. Epidural extension in the right canal and foramen at T1-2 is noted above. No other epidural metastases are seen. Cord: Cord edema related to cervical spine deposit. There is a 4 mm enhancing nodule along the upper right cauda equina at the level of L1. Paraspinal tissues: Known mediastinal/left hilar adenopathy/mass. Disc levels: Spondylosis with bridging anterior osteophytes. There are small posterior disc herniations without cord compression. IMPRESSION: 1. 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 Extensive vasogenic edema in the cord. 2. 4 mm nodule along the upper right cauda equina at the level of L1, which may indicate subarachnoid tumor spread. 3. Innumerable metastases throughout the cervical and thoracic spine  most notable at T1-2 where there is right-sided epidural and foraminal infiltration. 4. C5-6 and C6-7 degenerative spinal stenosis with cord flattening likely accentuated by the edema. Electronically Signed   By: Monte Fantasia M.D.   On: 05/22/2019 11:30   Mr Thoracic Spine W Wo Contrast  Result Date: 05/16/2019 CLINICAL DATA:  Worsening radicular pain in the left arm. EXAM: MRI THORACIC WITHOUT AND WITH CONTRAST TECHNIQUE: Multiplanar and multiecho pulse sequences of the thoracic spine were obtained without and with intravenous contrast. CONTRAST:  10 cc Gadavist intravenous COMPARISON:  12/01/2018 FINDINGS: MRI THORACIC SPINE FINDINGS Alignment:  No abnormal alignment. Vertebrae: Osseous metastatic disease which is essentially confluent in the C6, C7, T1, and T2 vertebrae, progressive from prior. Metastases also seen in the T3 spinous process and T4 through T11 vertebral bodies, most extensive in the T9 vertebral body. Cord: 11 mm mass in the spinal canal at the level of C7-T1. This appears intradural on axial postcontrast imaging but there is very limited assessment due to motion artifact. Cord edema is seen extending inferiorly. No contiguous mass is seen in the epidural space at this level. Paraspinal and other soft tissues: Left perihilar mass with mediastinal adenopathy. This is a significant change when compared to chest CT June 2020 Disc levels: Spondylosis and lower thoracic disc degeneration. No significant degenerative findings. IMPRESSION: 1. 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 Before any treatment suggest enhanced cervical MRI after symptom control. 2. Widespread osseous metastatic disease with progression from March 2020. 3. Thoracic progression from chest CT June 2020. Electronically Signed   By: Monte Fantasia M.D.   On: 05/16/2019 22:37   Ct Abdomen Pelvis W  Contrast  Result Date: 05/20/2019 CLINICAL DATA:  Metastatic small cell carcinoma EXAM: CT CHEST, ABDOMEN, AND PELVIS WITH CONTRAST TECHNIQUE: Multidetector CT imaging of the chest, abdomen and pelvis was performed following the standard protocol during bolus administration of intravenous contrast. CONTRAST:  121m OMNIPAQUE IOHEXOL 300 MG/ML  SOLN COMPARISON:  MRI 05/16/2019, [id number], CT chest abdomen pelvis 03/03/2019, 12/01/2018 FINDINGS: CT CHEST FINDINGS Cardiovascular: Nonaneurysmal aorta. Mild aortic atherosclerosis. Coronary vascular calcification. Normal heart size. Small pericardial effusion. Tumor in case mint of left upper lobe pulmonary arterial vessels which appears significantly narrowed. Mediastinum/Nodes: Incompletely visualized left  supraclavicular nodes. Progression of mediastinal adenopathy, now with multiple bulky enlarged lymph nodes. Right upper paratracheal lymph node measures 17 mm, not previously measured. Right paratracheal lymph node, series 2, image number 20 measures 25 mm. Substantially increased AP window node, measuring 23 mm, compared with 13 mm previously. Multiple additional paratracheal and precarinal nodes. Esophagus within normal limits. Lungs/Pleura: Irregular left upper lobe lung mass, significant progression compared to prior exam, contiguous with bulky left hilar/suprahilar mass. This measures approximately 5.6 cm AP by 4.6 transverse by 5 cm craniocaudad though precise measurements difficult as the mass is contiguous with mediastinal adenopathy. Interim development of small left-sided pleural effusion. 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 Musculoskeletal: Known widespread skeletal metastatic disease. Partially visualized sclerosis at C7 with possible pathologic fracture at the junction of the lamina with the spinous process. Sclerotic metastatic lesion at right T2 vertebral body and posterior  elements with pathologic superior endplate fracture at T2. Metastatic lesions at T9, T10 and T11. New soft tissue mass surrounding the right ninth posterior rib, consistent with metastatic disease. CT ABDOMEN PELVIS FINDINGS Hepatobiliary: Innumerable hypodense metastatic lesions within the liver, progressed compared to prior. A dominant left lobe lesion measures 7.2 cm by 5.7 cm, and may correspond to a previously measured lesion in the region which measured 3 x 18 mm. The gallbladder is contracted. No calcified stone. No biliary dilatation. Pancreas: Unremarkable. No pancreatic ductal dilatation or surrounding inflammatory changes. Spleen: Normal in size without focal abnormality. Adrenals/Urinary Tract: Progressed nodular thickening of the left greater than right adrenal glands, also concerning for progression of disease. Kidneys show no hydronephrosis. Subcentimeter hypodensity in the mid right kidney, too small to further characterize. The bladder is normal Stomach/Bowel: The stomach is nonenlarged. No dilated small bowel. No colon wall thickening. Vascular/Lymphatic: Moderate aortic atherosclerosis without aneurysm. Numerous enlarged porta hepatis, peripancreatic, portal caval and aortocaval nodes. Enlarged Peri aortic lymph nodes, measuring up to 2 cm, all progressed compared to prior. Reproductive: Prostate is unremarkable. Other: Negative for free air or free fluid. Small within the inguinal canals. Musculoskeletal: Stable sclerotic focus superior endplate L5. Stable faint sclerosis in the left iliac bone, adjacent lytic lesion is evident. IMPRESSION: 1. Large irregular left upper lobe mass, extending into the left hilar and suprahilar regions, consistent with disease recurrence. The mass invades the mediastinum and there is encasement of the left pulmonary arterial vessels by the soft tissue mass. 2. Significant progression of metastatic disease as evidence by worsened mediastinal adenopathy, hepatic  metastatic disease, and upper abdominal and retroperitoneal adenopathy. Increased nodularity of the left greater than right adrenal glands, also concerning for progression of metastatic disease. 3. New small left pleural effusion, with interim finding of multiple left pleural based nodules, consistent with pleural metastatic disease. 4. Skeletal metastatic disease with spinal lesions better seen on MRI. Pathologic superior endplate fracture at T2. New soft tissue mass surrounding the right posterior ninth rib, also consistent with metastatic disease. Electronically Signed   By: Donavan Foil M.D.   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 Bathe is clinically stable today.  He will continue to undergo radiotherapy to intramedullary C7 metastasis and bulky symptomatic thoracic body metastases with Dr. Lisbeth Renshaw.   We recommended decreasing dexamethasone to '2mg'$  daily x5 days, then '1mg'$  daily x5 days, then stopping therapy.  We appreciate the opportunity to participate in the care of [facility].  We will continue to follow along and plan to bring him back to clinic in 3 months after new set of total spine and brain MRI studies for evaluation.   All questions were answered. The patient knows to call the clinic with any problems, questions or concerns. No barriers to learning were detected.  The total time spent in the encounter was 25 minutes and more than 50% was on counseling and review of test results   Ventura Sellers, MD Medical Director of Neuro-Oncology [facility] [place] 05/29/19 3:15 PM

## 2019-05-29 NOTE — Progress Notes (Signed)
[place] CSW Progress Notes  Patient asked to see CSW,concerned that amounts on gift cards were incorrect.  Received 2 $25 gift cards from [employer] (first of 4 $50 [place] disbursements - total of $200 possible), one $25 card from [employer] (patient thought this should have been $700.  CSW clarified terms of [place], advised patient to speak w [employer].  Patient left w all 3 gift cards he had been given this week and a full explanation of how programs work.  Referred him to [facility], submitted [employer] gas card program application.  Edwyna Shell, LCSW Clinical Social Worker Phone:  [phone]

## 2019-05-29 NOTE — Telephone Encounter (Signed)
Dr. Irene Limbo wrote prescriptions for hospital bed and bedside commode for home use. Contacted  [employer]  [phone] and gave patient and provider information and equipment needed. Contacted Ms. Tasso to update, advising that they may contact them over weekend. She verbalized understanding

## 2019-05-31 NOTE — Progress Notes (Signed)
HEMATOLOGY/ONCOLOGY CLINIC NOTE  Date of Service: 06/02/2019  Patient Care Team: Default, Provider, MD as PCP - General  CHIEF COMPLAINTS/PURPOSE OF CONSULTATION:  F/u for Small Cell Lung Cancer  HISTORY OF PRESENTING ILLNESS:  Cody Montoya Stubitsis a 63 y.o.malewith medical history significant ofalcohol abuse.He does not have a primary care provider and does not seek routine medical care. Around 3 weeks ago, the patient started having epigastric abdominal pain. It would wax and wane in severity but progressively worsenedsince onset. He has had associated nausea and vomiting. His appetite has decreased and he is lost around 10 pounds. He stopped drinking when this happened. He would drink around 7-8 shots of vodka daily in addition to the 2-3 beers. He has done this for the past 4-5 years. He does not have any known history of hepatitis B or C and denies taking any medication/supplements routinely. He denies any prominent vessels, yellowing of the skin/eyes, fevers, shortness of breath, or bowel changes. The patient has been using ibuprofen for pain with little relief.  In the emergency room, a CT scan showsmultifocal hepatocellular carcinoma that is not appear to be metastatic to the liver. There is also thoracic adenopathy,omental/peritoneal involvement and left pleural effusion suggestive of metastatic disease. The oncology team was called and recommended admission for work-up.   When seen today, the patient reports that he continues to have abdominal discomfort in his upper abdomen.  Along with the discomfort in his abdomen, he also reports nausea and vomiting and increased constipation.  This is been present for about 2 to 3 weeks.  He states his appetite has been decreased and he has lost weight.  He estimates that he has lost about 10 pounds over the past few months.  Oncology was asked to see the patient to make recommendations regarding his liver  masses.  Interval History:   Cody Montoya is here for follow-up of his extensive stage small cell lung cancer. The patient's last visit with [place] was on 05/11/2019. The pt reports that he is doing well overall.  The pt reports that his pain has been well controlled and he is attempting to decrease his intake of Morphine by increasing his dose of Oxycodone. His last day of radiation is 06/09/2019. He is eating well and his eating habits have been improving. He reports that he is eating at about 25% of what he would otherwise normally eat and has been using supplements such as Boost or Ensure. Pt would prefer to not get a port but reports difficulty with finding his veins during his last visit to the hospital. He is not interested in home care services at this time.   Lab results today (06/02/19) of CBC w/diff and CMP is as follows: all values are WNL except for Lymphs Abs at 0.5, Sodium at 132, AST at 70, ALT at 53, Alkaline Phosphatase at 162.   06/02/2019 TSH at 2.997 06/02/2019 T4 is in progress  On review of systems, pt reports improved numbness, back pain and denies abdominal pain and any other symptoms.    MEDICAL HISTORY:  Past Medical History:  Diagnosis Date   Cancer [facility])     SURGICAL HISTORY: Past Surgical History:  Procedure Laterality Date   NO PAST SURGERIES     RADIOLOGY WITH ANESTHESIA N/A 05/22/2019   Procedure: MRI CERVICAL SPINE AND THORACIC SPINE WITH AND WITHOUT CONTRAST, WITH ANESTHESIA;  Surgeon: Radiologist, Medication, MD;  Location: [place];  Service: Radiology;  Laterality: N/A;  SOCIAL HISTORY: Social History   Socioeconomic History   Marital status: Single    Spouse name: Not on file   Number of children: Not on file   Years of education: Not on file   Highest education level: Not on file  Occupational History   Occupation: Surveyor, quantity  Social Needs   Financial resource strain: Not very hard   Food insecurity    Worry:  Never true    Inability: Never true   Transportation needs    Medical: No    Non-medical: No  Tobacco Use   Smoking status: Current Every Day Smoker    Packs/day: 1.00    Years: 45.00    Pack years: 45.00    Types: Cigarettes   Smokeless tobacco: Never Used  Substance and Sexual Activity   Alcohol use: Not Currently    Alcohol/week: 8.0 standard drinks    Types: 3 Cans of beer, 5 Shots of liquor per week    Comment: no alcohol in past month due to symptoms   Drug use: Not Currently    Frequency: 7.0 times per week    Types: Marijuana   Sexual activity: Not Currently  Lifestyle   Physical activity    Days per week: 5 days    Minutes per session: 150+ min   Stress: To some extent  Relationships   Social connections    Talks on phone: More than three times a week    Gets together: More than three times a week    Attends religious service: Never    Active member of club or organization: No    Attends meetings of clubs or organizations: Never    Relationship status: Living with partner   Intimate partner violence    Fear of current or ex partner: No    Emotionally abused: No    Physically abused: No    Forced sexual activity: No  Other Topics Concern   Not on file  Social History Narrative   Not on file    FAMILY HISTORY: Family History  Problem Relation Age of Onset   Breast cancer Sister    Breast cancer Sister     ALLERGIES:  has No Known Allergies.  MEDICATIONS:  Current Outpatient Medications  Medication Sig Dispense Refill   acetaminophen (TYLENOL) 500 MG tablet Take 2 tablets (1,000 mg total) by mouth every 8 (eight) hours as needed for moderate pain. 30 tablet 0   B Complex Vitamins SOLN Take 1 mL by mouth daily.     Calcium 600-200 MG-UNIT tablet Take 1 tablet by mouth daily.     Cholecalciferol (VITAMIN D) 50 MCG (2000 UT) CAPS Take 2,000 Units by mouth daily.     dexamethasone (DECADRON) 1 MG tablet Take #2mg  for 5 days, then take  #1mg  for 5 days, then stop 15 tablet 0   dimenhyDRINATE (DRAMAMINE) 50 MG tablet Take 50 mg by mouth every 8 (eight) hours as needed for dizziness.     docusate sodium (COLACE) 100 MG capsule Take 1 capsule (100 mg total) by mouth 2 (two) times daily. (Patient taking differently: Take 200 mg by mouth daily. ) 10 capsule 0   LORazepam (ATIVAN) 0.5 MG tablet 1 tab po q 4-6 hours prn or 1 tab po 30 minutes prior to MRI or radiation (Patient taking differently: Take 0.5 mg by mouth every 6 (six) hours as needed for anxiety or sedation. ) 10 tablet 0   memantine (NAMENDA) 5 MG tablet Start on first  day of XRT: Week 1: take 1 tablet po q am Week 2: take 1 tablet po q am and 1 tablet po q pm Week 3: take 2 tablets po q am, and 1 tablets po q pm Week 4-24: take 2 tablets po q am, and 2 tablets po q pm.  Stop after 24 weeks 10/09/2019 should be last day 120 tablet 3   Menthol (BIOFREEZE) 10.5 % AERO Apply 1 application topically every 4 (four) hours as needed (pain).     morphine (MSIR) 15 MG tablet Take 1 tablet (15 mg total) by mouth every 3 (three) hours as needed for moderate pain or severe pain. 90 tablet 0   Multiple Vitamin (MULTIVITAMIN WITH MINERALS) TABS tablet Take 1 tablet by mouth daily.     oxyCODONE (OXYCONTIN) 40 mg 12 hr tablet Take 1 tablet (40 mg total) by mouth every 12 (twelve) hours. 60 tablet 0   polyethylene glycol (MIRALAX / GLYCOLAX) packet Take 17 g by mouth daily as needed for mild constipation. (Patient taking differently: Take 17 g by mouth daily. ) 14 each 0   senna-docusate (SENOKOT-S) 8.6-50 MG tablet Take 2 tablets by mouth 2 (two) times daily. 120 tablet 0   No current facility-administered medications for this visit.     REVIEW OF SYSTEMS:    A 10+ POINT REVIEW OF SYSTEMS WAS OBTAINED including neurology, dermatology, psychiatry, cardiac, respiratory, lymph, extremities, GI, GU, Musculoskeletal, constitutional, breasts, reproductive, HEENT.  All pertinent  positives are noted in the HPI.  All others are negative.   PHYSICAL EXAMINATION: ECOG PERFORMANCE STATUS: 1-2  .BP 96/77 (BP Location: Left Arm, Patient Position: Sitting)    Pulse 69    Temp 98 F (36.7 C) (Temporal)    Resp 18    Ht 5\' 10"  (1.778 m)    SpO2 98%    BMI 22.83 kg/m   Exam was given in a chair   GENERAL:alert, in no acute distress and comfortable SKIN: no acute rashes, no significant lesions EYES: conjunctiva are pink and non-injected, sclera anicteric OROPHARYNX: MMM, no exudates, no oropharyngeal erythema or ulceration NECK: supple, no JVD LYMPH:  no palpable lymphadenopathy in the cervical, axillary or inguinal regions LUNGS: clear to auscultation b/l with normal respiratory effort HEART: regular rate & rhythm ABDOMEN:  normoactive bowel sounds , non tender, not distended. No palpable hepatosplenomegaly.  Extremity: no pedal edema PSYCH: alert & oriented x 3 with fluent speech NEURO: no focal motor/sensory deficits. Mild tenderness in mid-thoracic spine   LABORATORY DATA:  I have reviewed the data as listed  . CBC Latest Ref Rng & Units 06/02/2019 05/24/2019 05/23/2019  WBC 4.0 - 10.5 K/uL 6.9 12.2(H) 7.1  Hemoglobin 13.0 - 17.0 g/dL 13.7 14.8 13.4  Hematocrit 39.0 - 52.0 % 39.9 43.7 40.1  Platelets 150 - 400 K/uL 163 196 168    . CMP Latest Ref Rng & Units 05/24/2019 05/23/2019 05/22/2019  Glucose 70 - 99 mg/dL 106(H) 108(H) 114(H)  BUN 8 - 23 mg/dL 17 17 19   Creatinine 0.61 - 1.24 mg/dL 0.72 0.56(L) 0.57(L)  Sodium 135 - 145 mmol/L 132(L) 131(L) 131(L)  Potassium 3.5 - 5.1 mmol/L 4.4 4.3 4.4  Chloride 98 - 111 mmol/L 97(L) 99 99  CO2 22 - 32 mmol/L 24 22 22   Calcium 8.9 - 10.3 mg/dL 8.9 8.6(L) 9.1  Total Protein 6.5 - 8.1 g/dL 7.3 6.5 7.5  Total Bilirubin 0.3 - 1.2 mg/dL 1.3(H) 1.1 1.1  Alkaline Phos 38 - 126 U/L  97 78 88  AST 15 - 41 U/L 55(H) 43(H) 44(H)  ALT 0 - 44 U/L 65(H) 50(H) 53(H)       RADIOGRAPHIC STUDIES: I have personally reviewed  the radiological images as listed and agreed with the findings in the report. Ct Soft Tissue Neck W Contrast  Result Date: 05/20/2019 CLINICAL DATA:  Metastatic small cell carcinoma of the lung. Known brain and spinal metastases. Undergoing radiation therapy. EXAM: CT NECK WITH CONTRAST TECHNIQUE: Multidetector CT imaging of the neck was performed using the standard protocol following the bolus administration of intravenous contrast. CONTRAST:  141mL OMNIPAQUE IOHEXOL 300 MG/ML  SOLN COMPARISON:  None. FINDINGS: PHARYNX AND LARYNX: --Nasopharynx: Fossae of Rosenmuller are clear. Normal adenoid tonsils for age. --Oral cavity and oropharynx: The palatine and lingual tonsils are normal. The visible oral cavity and floor of mouth are normal. --Hypopharynx: Normal vallecula and pyriform sinuses. --Larynx: Normal epiglottis and pre-epiglottic space. Normal aryepiglottic and vocal folds. --Retropharyngeal space: No abscess, effusion or lymphadenopathy. SALIVARY GLANDS: --Parotid: No mass lesion or inflammation. No sialolithiasis or ductal dilatation. --Submandibular: Symmetric without inflammation. No sialolithiasis or ductal dilatation. --Sublingual: Normal. No ranula or other visible lesion of the base of tongue and floor of mouth. THYROID: Normal. LYMPH NODES: No enlarged or abnormal density cervical lymph nodes. VASCULAR: Major cervical vessels are patent. LIMITED INTRACRANIAL: Normal. VISUALIZED ORBITS: Normal. MASTOIDS AND VISUALIZED PARANASAL SINUSES: No fluid levels or advanced mucosal thickening. No mastoid effusion. SKELETON: Metastatic lesions are again demonstrated within the vertebral bodies at C6 and C7. No pathologic fracture. There are multiple upper thoracic metastases as well. There are age-indeterminate fractures of the spinous processes at C6 and C7. UPPER CHEST: Mediastinal lymphadenopathy and left suprahilar lung mass are better characterized on the concomitant CT of the chest. OTHER:  Low-attenuation subcutaneous left submental lesion is likely a sebaceous cyst. IMPRESSION: 1. No soft tissue mass or lymphadenopathy of the neck. 2. Multiple osseous metastatic lesions of the cervical and thoracic spine, better characterized on the recent thoracic spine MRI. 3. Age-indeterminate fractures of the spinous processes at C6 and C7. 4. Mediastinal lymphadenopathy and left suprahilar lung mass are better characterized on the concomitant CT of the chest. Electronically Signed   By: Ulyses Jarred M.D.   On: 05/20/2019 23:37   Ct Chest W Contrast  Result Date: 05/20/2019 CLINICAL DATA:  Metastatic small cell carcinoma EXAM: CT CHEST, ABDOMEN, AND PELVIS WITH CONTRAST TECHNIQUE: Multidetector CT imaging of the chest, abdomen and pelvis was performed following the standard protocol during bolus administration of intravenous contrast. CONTRAST:  155mL OMNIPAQUE IOHEXOL 300 MG/ML  SOLN COMPARISON:  MRI 05/16/2019, 893 2020, CT chest abdomen pelvis 03/03/2019, 12/01/2018 FINDINGS: CT CHEST FINDINGS Cardiovascular: Nonaneurysmal aorta. Mild aortic atherosclerosis. Coronary vascular calcification. Normal heart size. Small pericardial effusion. Tumor in case mint of left upper lobe pulmonary arterial vessels which appears significantly narrowed. Mediastinum/Nodes: Incompletely visualized left supraclavicular nodes. Progression of mediastinal adenopathy, now with multiple bulky enlarged lymph nodes. Right upper paratracheal lymph node measures 17 mm, not previously measured. Right paratracheal lymph node, series 2, image number 20 measures 25 mm. Substantially increased AP window node, measuring 23 mm, compared with 13 mm previously. Multiple additional paratracheal and precarinal nodes. Esophagus within normal limits. Lungs/Pleura: Irregular left upper lobe lung mass, significant progression compared to prior exam, contiguous with bulky left hilar/suprahilar mass. This measures approximately 5.6 cm AP by 4.6  transverse by 5 cm craniocaudad though precise measurements difficult as the mass is contiguous with mediastinal  adenopathy. Interim development of small left-sided pleural effusion. 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 Musculoskeletal: Known widespread skeletal metastatic disease. Partially visualized sclerosis at C7 with possible pathologic fracture at the junction of the lamina with the spinous process. Sclerotic metastatic lesion at right T2 vertebral body and posterior elements with pathologic superior endplate fracture at T2. Metastatic lesions at T9, T10 and T11. New soft tissue mass surrounding the right ninth posterior rib, consistent with metastatic disease. CT ABDOMEN PELVIS FINDINGS Hepatobiliary: Innumerable hypodense metastatic lesions within the liver, progressed compared to prior. A dominant left lobe lesion measures 7.2 cm by 5.7 cm, and may correspond to a previously measured lesion in the region which measured 3 x 18 mm. The gallbladder is contracted. No calcified stone. No biliary dilatation. Pancreas: Unremarkable. No pancreatic ductal dilatation or surrounding inflammatory changes. Spleen: Normal in size without focal abnormality. Adrenals/Urinary Tract: Progressed nodular thickening of the left greater than right adrenal glands, also concerning for progression of disease. Kidneys show no hydronephrosis. Subcentimeter hypodensity in the mid right kidney, too small to further characterize. The bladder is normal Stomach/Bowel: The stomach is nonenlarged. No dilated small bowel. No colon wall thickening. Vascular/Lymphatic: Moderate aortic atherosclerosis without aneurysm. Numerous enlarged porta hepatis, peripancreatic, portal caval and aortocaval nodes. Enlarged Peri aortic lymph nodes, measuring up to 2 cm, all progressed compared to prior. Reproductive: Prostate is unremarkable. Other: Negative for free air or free  fluid. Small within the inguinal canals. Musculoskeletal: Stable sclerotic focus superior endplate L5. Stable faint sclerosis in the left iliac bone, adjacent lytic lesion is evident. IMPRESSION: 1. Large irregular left upper lobe mass, extending into the left hilar and suprahilar regions, consistent with disease recurrence. The mass invades the mediastinum and there is encasement of the left pulmonary arterial vessels by the soft tissue mass. 2. Significant progression of metastatic disease as evidence by worsened mediastinal adenopathy, hepatic metastatic disease, and upper abdominal and retroperitoneal adenopathy. Increased nodularity of the left greater than right adrenal glands, also concerning for progression of metastatic disease. 3. New small left pleural effusion, with interim finding of multiple left pleural based nodules, consistent with pleural metastatic disease. 4. Skeletal metastatic disease with spinal lesions better seen on MRI. Pathologic superior endplate fracture at T2. New soft tissue mass surrounding the right posterior ninth rib, also consistent with metastatic disease. Electronically Signed   By: Donavan Foil M.D.   On: 05/20/2019 23:52   Mr Cervical Spine W Wo Contrast  Result Date: 05/22/2019 CLINICAL DATA:  Metastatic disease. EXAM: MRI CERVICAL AND THORACIC SPINE WITHOUT AND WITH CONTRAST TECHNIQUE: Multiplanar and multiecho pulse sequences of the cervical and thoracic spine were obtained without and with intravenous contrast. CONTRAST:  8 cc Gadavist intravenous COMPARISON:  Thoracic MRI from 6 days ago. FINDINGS: Cervical spine: Alignment: Normal Vertebrae: Metastatic infiltration in the C2 spinous process, C4 body, C5 body, and nearly a confluent involvement of the C6-T2 vertebrae involving bodies and posterior elements. No pathologic fracture. There is epidural tumor extension along the right aspect of the canal at T1-2, which replaces the right foramen and contacts the cord.  Cord: Ovoid mass within the cervical cord at the level of C7, 17 x 11 mm. Cord edema extends cranially and inferiorly throughout the cervical and upper thoracic cord. It is unclear if this is direct spread from the right foraminal disease at T1-2 or a hematogenous metastasis. Posterior Fossa, vertebral arteries, paraspinal tissues: Known adenopathy at  the thoracic inlet. Disc levels: C4-5 small central disc protrusion with mild cord deformity. C5-6 disc narrowing with bulging and ridging causing biforaminal impingement and ventral cord flattening. C6-7 left paracentral protrusion and uncovertebral ridging left foraminal impingement. Central disc herniation flattens the ventral cord. Thoracic spine: Alignment: Normal Vertebrae: Innumerable metastases seen at every level within most confluent disease seen in the T1, T2, T8, T9, T11, and T12 vertebral bodies. Epidural extension in the right canal and foramen at T1-2 is noted above. No other epidural metastases are seen. Cord: Cord edema related to cervical spine deposit. There is a 4 mm enhancing nodule along the upper right cauda equina at the level of L1. Paraspinal tissues: Known mediastinal/left hilar adenopathy/mass. Disc levels: Spondylosis with bridging anterior osteophytes. There are small posterior disc herniations without cord compression. IMPRESSION: 1. 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 Extensive vasogenic edema in the cord. 2. 4 mm nodule along the upper right cauda equina at the level of L1, which may indicate subarachnoid tumor spread. 3. Innumerable metastases throughout the cervical and thoracic spine most notable at T1-2 where there is right-sided epidural and foraminal infiltration. 4. C5-6 and C6-7 degenerative spinal stenosis with cord flattening likely accentuated by the edema. Electronically Signed   By: Monte Fantasia M.D.   On: 05/22/2019 11:30   Mr Thoracic Spine W Wo Contrast  Result Date: 05/22/2019 CLINICAL DATA:  Metastatic  disease. EXAM: MRI CERVICAL AND THORACIC SPINE WITHOUT AND WITH CONTRAST TECHNIQUE: Multiplanar and multiecho pulse sequences of the cervical and thoracic spine were obtained without and with intravenous contrast. CONTRAST:  8 cc Gadavist intravenous COMPARISON:  Thoracic MRI from 6 days ago. FINDINGS: Cervical spine: Alignment: Normal Vertebrae: Metastatic infiltration in the C2 spinous process, C4 body, C5 body, and nearly a confluent involvement of the C6-T2 vertebrae involving bodies and posterior elements. No pathologic fracture. There is epidural tumor extension along the right aspect of the canal at T1-2, which replaces the right foramen and contacts the cord. Cord: Ovoid mass within the cervical cord at the level of C7, 17 x 11 mm. Cord edema extends cranially and inferiorly throughout the cervical and upper thoracic cord. It is unclear if this is direct spread from the right foraminal disease at T1-2 or a hematogenous metastasis. Posterior Fossa, vertebral arteries, paraspinal tissues: Known adenopathy at the thoracic inlet. Disc levels: C4-5 small central disc protrusion with mild cord deformity. C5-6 disc narrowing with bulging and ridging causing biforaminal impingement and ventral cord flattening. C6-7 left paracentral protrusion and uncovertebral ridging left foraminal impingement. Central disc herniation flattens the ventral cord. Thoracic spine: Alignment: Normal Vertebrae: Innumerable metastases seen at every level within most confluent disease seen in the T1, T2, T8, T9, T11, and T12 vertebral bodies. Epidural extension in the right canal and foramen at T1-2 is noted above. No other epidural metastases are seen. Cord: Cord edema related to cervical spine deposit. There is a 4 mm enhancing nodule along the upper right cauda equina at the level of L1. Paraspinal tissues: Known mediastinal/left hilar adenopathy/mass. Disc levels: Spondylosis with bridging anterior osteophytes. There are small  posterior disc herniations without cord compression. IMPRESSION: 1. 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 Extensive vasogenic edema in the cord. 2. 4 mm nodule along the upper right cauda equina at the level of L1, which may indicate subarachnoid tumor spread. 3. Innumerable metastases throughout the cervical and thoracic spine most notable at T1-2 where there is right-sided epidural and foraminal infiltration. 4.  C5-6 and C6-7 degenerative spinal stenosis with cord flattening likely accentuated by the edema. Electronically Signed   By: Monte Fantasia M.D.   On: 05/22/2019 11:30   Mr Thoracic Spine W Wo Contrast  Result Date: 05/16/2019 CLINICAL DATA:  Worsening radicular pain in the left arm. EXAM: MRI THORACIC WITHOUT AND WITH CONTRAST TECHNIQUE: Multiplanar and multiecho pulse sequences of the thoracic spine were obtained without and with intravenous contrast. CONTRAST:  10 cc Gadavist intravenous COMPARISON:  12/01/2018 FINDINGS: MRI THORACIC SPINE FINDINGS Alignment:  No abnormal alignment. Vertebrae: Osseous metastatic disease which is essentially confluent in the C6, C7, T1, and T2 vertebrae, progressive from prior. Metastases also seen in the T3 spinous process and T4 through T11 vertebral bodies, most extensive in the T9 vertebral body. Cord: 11 mm mass in the spinal canal at the level of C7-T1. This appears intradural on axial postcontrast imaging but there is very limited assessment due to motion artifact. Cord edema is seen extending inferiorly. No contiguous mass is seen in the epidural space at this level. Paraspinal and other soft tissues: Left perihilar mass with mediastinal adenopathy. This is a significant change when compared to chest CT June 2020 Disc levels: Spondylosis and lower thoracic disc degeneration. No significant degenerative findings. IMPRESSION: 1. 11 mm spinal canal mass at C7-T1 that is favored intradural/intramedullary based on axial postcontrast images, but very limited  assessment due to degree of motion. Before any treatment suggest enhanced cervical MRI after symptom control. 2. Widespread osseous metastatic disease with progression from March 2020. 3. Thoracic progression from chest CT June 2020. Electronically Signed   By: Monte Fantasia M.D.   On: 05/16/2019 22:37   Ct Abdomen Pelvis W Contrast  Result Date: 05/20/2019 CLINICAL DATA:  Metastatic small cell carcinoma EXAM: CT CHEST, ABDOMEN, AND PELVIS WITH CONTRAST TECHNIQUE: Multidetector CT imaging of the chest, abdomen and pelvis was performed following the standard protocol during bolus administration of intravenous contrast. CONTRAST:  140mL OMNIPAQUE IOHEXOL 300 MG/ML  SOLN COMPARISON:  MRI 05/16/2019, 628 2020, CT chest abdomen pelvis 03/03/2019, 12/01/2018 FINDINGS: CT CHEST FINDINGS Cardiovascular: Nonaneurysmal aorta. Mild aortic atherosclerosis. Coronary vascular calcification. Normal heart size. Small pericardial effusion. Tumor in case mint of left upper lobe pulmonary arterial vessels which appears significantly narrowed. Mediastinum/Nodes: Incompletely visualized left supraclavicular nodes. Progression of mediastinal adenopathy, now with multiple bulky enlarged lymph nodes. Right upper paratracheal lymph node measures 17 mm, not previously measured. Right paratracheal lymph node, series 2, image number 20 measures 25 mm. Substantially increased AP window node, measuring 23 mm, compared with 13 mm previously. Multiple additional paratracheal and precarinal nodes. Esophagus within normal limits. Lungs/Pleura: Irregular left upper lobe lung mass, significant progression compared to prior exam, contiguous with bulky left hilar/suprahilar mass. This measures approximately 5.6 cm AP by 4.6 transverse by 5 cm craniocaudad though precise measurements difficult as the mass is contiguous with mediastinal adenopathy. Interim development of small left-sided pleural effusion. Now seen are multiple pleural based lung  nodules in the left thorax, concerning for pleural metastatic disease. Musculoskeletal: Known widespread skeletal metastatic disease. Partially visualized sclerosis at C7 with possible pathologic fracture at the junction of the lamina with the spinous process. Sclerotic metastatic lesion at right T2 vertebral body and posterior elements with pathologic superior endplate fracture at T2. Metastatic lesions at T9, T10 and T11. New soft tissue mass surrounding the right ninth posterior rib, consistent with metastatic disease. CT ABDOMEN PELVIS FINDINGS Hepatobiliary: Innumerable hypodense metastatic lesions within the liver, progressed compared to  prior. A dominant left lobe lesion measures 7.2 cm by 5.7 cm, and may correspond to a previously measured lesion in the region which measured 3 x 18 mm. The gallbladder is contracted. No calcified stone. No biliary dilatation. Pancreas: Unremarkable. No pancreatic ductal dilatation or surrounding inflammatory changes. Spleen: Normal in size without focal abnormality. Adrenals/Urinary Tract: Progressed nodular thickening of the left greater than right adrenal glands, also concerning for progression of disease. Kidneys show no hydronephrosis. Subcentimeter hypodensity in the mid right kidney, too small to further characterize. The bladder is normal Stomach/Bowel: The stomach is nonenlarged. No dilated small bowel. No colon wall thickening. Vascular/Lymphatic: Moderate aortic atherosclerosis without aneurysm. Numerous enlarged porta hepatis, peripancreatic, portal caval and aortocaval nodes. Enlarged Peri aortic lymph nodes, measuring up to 2 cm, all progressed compared to prior. Reproductive: Prostate is unremarkable. Other: Negative for free air or free fluid. Small within the inguinal canals. Musculoskeletal: Stable sclerotic focus superior endplate L5. Stable faint sclerosis in the left iliac bone, adjacent lytic lesion is evident. IMPRESSION: 1. Large irregular left upper  lobe mass, extending into the left hilar and suprahilar regions, consistent with disease recurrence. The mass invades the mediastinum and there is encasement of the left pulmonary arterial vessels by the soft tissue mass. 2. Significant progression of metastatic disease as evidence by worsened mediastinal adenopathy, hepatic metastatic disease, and upper abdominal and retroperitoneal adenopathy. Increased nodularity of the left greater than right adrenal glands, also concerning for progression of metastatic disease. 3. New small left pleural effusion, with interim finding of multiple left pleural based nodules, consistent with pleural metastatic disease. 4. Skeletal metastatic disease with spinal lesions better seen on MRI. Pathologic superior endplate fracture at T2. New soft tissue mass surrounding the right posterior ninth rib, also consistent with metastatic disease. Electronically Signed   By: Donavan Foil M.D.   On: 05/20/2019 23:52    ASSESSMENT & PLAN:   63 y.o. male with  1. 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 2. Mild chronic small vessel ischemic disease."   2.  Abnormal liver function tests.  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  Bump in bilirubin could be from progression of his liver mets.   12/18/18 US Abdomen revealed Widespread metastatic disease throughout the liver. The liver has a somewhat nodular contour suggesting underlying cirrhosis. 2. No gallstones seen. Gallbladder wall appears mildly thickened and edematous. Sludge is noted in the gallbladder. There is also prominence of the common bile duct without mass or calculus evident. These findings raise concern for potential degree of acalculus cholecystitis. In this regard, it may be prudent to consider nuclear medicine hepatobiliary imaging study to assess for  cystic duct patency.  01/02/19 NM Hepatobiliary including GB revealed No biliary obstruction. 2. Normal hepatobiliary scan.  S/p 4 cycles of Carboplatin AUC of 5, 50mg /m2 Etoposide, and Atezolizumab at this time.  03/03/19 CT C/A/P revealed "Substantial partial treatment response as detailed below. No new or progressive metastatic disease. 2. Central left upper lobe lung mass is substantially decreased in size. 3. Mediastinal adenopathy is substantially decreased, with residual mild AP window adenopathy. Retroperitoneal adenopathy has resolved. 4. Liver metastases are decreased. 5. Stable faintly sclerotic osseous lesions in the spine and left iliac wing. 6. Stable irregular thickening of the adrenal glands without discrete adrenal nodules. 7. Aortic Atherosclerosis."  Previously discussed the NCCN guidelines of 4-6 cycles of treatment,  followed by maintenance immunotherapy. Pt's disease has responded well, and discussed risks vs benefits of completing 1-2 additional cycles (pt has completed 4 cycles thus far), in setting of patient's liver cirrhosis and palliative treatment. Discussed patient's goals of care. Patient's goals are to stop chemotherapy treatment after C4, and will focus on spending quality time with friends and family.  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 No significant edema or mass effect."  PLAN:  -Discussed pt labwork today, 06/02/19; all values are WNL except for Lymphs Abs at 0.5, Sodium at 132, AST at 70, ALT at 53, Alkaline Phosphatase at 162.   -Discussed 06/02/2019 TSH at 2.997 -Discussed 06/02/2019 T4 is in progress -discussed goals of care and consideration of comfort cares with hospice vs palliative chemotherapy . -he chooses the later and feels he is function well enough to want to try palliative chemotherapy -Will place pt on IV Topotecan, when he is done with his radiation -Recommend a weekly  regimen, until progression or intolerance  -Will monitor for potential leukopenia or diarrhea -Pt will begin chemotherapy in 1 week -Will discontinue immunotherapy -Hold off on placing a port a this time per patients choice -Continue Xgeva every 4 weeks for bone strengthening, denies dental pains, discussed risk of non-healing ulcers   FOLLOW UP: -Discontinuing all appointments for immunotherapy -Schedule to start weekly topotecan in 7 days with labs (as per treatment orders) -labs with each treatment -RTC with Dr Irene Limbo with 2nd weekly dose of topotecan for toxicity check   The total time spent in the appt was 25 minutes and more than 50% was on counseling and direct patient cares.  All of the patient's questions were answered with apparent satisfaction. The patient knows to call the clinic with any problems, questions or concerns.   Sullivan Lone MD [place] AAHIVMS [facility] Hematology/Oncology Physician [facility]  (Office):       [phone] (Work cell):  [phone] (Fax):           [phone]  I, Yevette Edwards, am acting as a scribe for Dr. Sullivan Lone.   .I have reviewed the above documentation for accuracy and completeness, and I agree with the above. Brunetta Genera MD

## 2019-06-02 ENCOUNTER — Other Ambulatory Visit: Payer: Self-pay

## 2019-06-02 ENCOUNTER — Inpatient Hospital Stay (HOSPITAL_BASED_OUTPATIENT_CLINIC_OR_DEPARTMENT_OTHER): Payer: Medicaid Other | Admitting: Hematology

## 2019-06-02 ENCOUNTER — Inpatient Hospital Stay: Payer: Medicaid Other

## 2019-06-02 ENCOUNTER — Ambulatory Visit
Admission: RE | Admit: 2019-06-02 | Discharge: 2019-06-02 | Disposition: A | Discharge status: Home / Self Care | Payer: Medicaid Other | Source: Ambulatory Visit | Attending: Radiation Oncology | Admitting: Radiation Oncology

## 2019-06-02 VITALS — BP 96/77 | HR 69 | Temp 98.0°F | Resp 18 | Ht 70.0 in

## 2019-06-02 DIAGNOSIS — Z7189 Other specified counseling: Secondary | ICD-10-CM

## 2019-06-02 DIAGNOSIS — C7951 Secondary malignant neoplasm of bone: Secondary | ICD-10-CM

## 2019-06-02 DIAGNOSIS — Z510 Encounter for antineoplastic radiation therapy: Secondary | ICD-10-CM | POA: Diagnosis not present

## 2019-06-02 DIAGNOSIS — Z5111 Encounter for antineoplastic chemotherapy: Secondary | ICD-10-CM | POA: Diagnosis not present

## 2019-06-02 DIAGNOSIS — C787 Secondary malignant neoplasm of liver and intrahepatic bile duct: Secondary | ICD-10-CM

## 2019-06-02 DIAGNOSIS — C3490 Malignant neoplasm of unspecified part of unspecified bronchus or lung: Secondary | ICD-10-CM

## 2019-06-02 LAB — CBC WITH DIFFERENTIAL/PLATELET
Abs Immature Granulocytes: 0.05 10*3/uL (ref 0.00–0.07)
Basophils Absolute: 0 10*3/uL (ref 0.0–0.1)
Basophils Relative: 0 %
Eosinophils Absolute: 0 10*3/uL (ref 0.0–0.5)
Eosinophils Relative: 0 %
HCT: 39.9 % (ref 39.0–52.0)
Hemoglobin: 13.7 g/dL (ref 13.0–17.0)
Immature Granulocytes: 1 %
Lymphocytes Relative: 7 %
Lymphs Abs: 0.5 10*3/uL — ABNORMAL LOW (ref 0.7–4.0)
MCH: 31.9 pg (ref 26.0–34.0)
MCHC: 34.3 g/dL (ref 30.0–36.0)
MCV: 93 fL (ref 80.0–100.0)
Monocytes Absolute: 0.6 10*3/uL (ref 0.1–1.0)
Monocytes Relative: 8 %
Neutro Abs: 5.7 10*3/uL (ref 1.7–7.7)
Neutrophils Relative %: 84 %
Platelets: 163 10*3/uL (ref 150–400)
RBC: 4.29 MIL/uL (ref 4.22–5.81)
RDW: 12.2 % (ref 11.5–15.5)
WBC: 6.9 10*3/uL (ref 4.0–10.5)
nRBC: 0 % (ref 0.0–0.2)

## 2019-06-02 LAB — CMP (CANCER CENTER ONLY)
ALT: 53 U/L — ABNORMAL HIGH (ref 0–44)
AST: 70 U/L — ABNORMAL HIGH (ref 15–41)
Albumin: 3.7 g/dL (ref 3.5–5.0)
Alkaline Phosphatase: 162 U/L — ABNORMAL HIGH (ref 38–126)
Anion gap: 10 (ref 5–15)
BUN: 13 mg/dL (ref 8–23)
CO2: 24 mmol/L (ref 22–32)
Calcium: 9.2 mg/dL (ref 8.9–10.3)
Chloride: 98 mmol/L (ref 98–111)
Creatinine: 0.7 mg/dL (ref 0.61–1.24)
GFR, Est AFR Am: >60 mL/min (ref >60)
GFR, Estimated: >60 mL/min (ref >60)
Glucose, Bld: 94 mg/dL (ref 70–99)
Potassium: 4.1 mmol/L (ref 3.5–5.1)
Sodium: 132 mmol/L — ABNORMAL LOW (ref 135–145)
Total Bilirubin: 1 mg/dL (ref 0.3–1.2)
Total Protein: 6.8 g/dL (ref 6.5–8.1)

## 2019-06-02 LAB — TSH: TSH: 2.997 u[IU]/mL (ref 0.320–4.118)

## 2019-06-02 NOTE — Progress Notes (Signed)
DISCONTINUE ON PATHWAY REGIMEN - Small Cell Lung     Cycles 1 through 4, every 21 days:     Atezolizumab      Carboplatin      Etoposide    Cycles 5 and beyond, every 21 days:     Atezolizumab   **Always confirm dose/schedule in your pharmacy ordering system**  REASON: Disease Progression PRIOR TREATMENT: [id number]: Atezolizumab 1,200 mg D1 + Carboplatin AUC=5 D1 + Etoposide 100 mg/m2 D1-3 q21 Days x 4 Cycles, Followed by Atezolizumab 1,200 mg Maintenance Until Progression or Unacceptable Toxicity TREATMENT RESPONSE: Partial Response (PR)  START ON PATHWAY REGIMEN - Small Cell Lung     Administer weekly:     Topotecan   **Always confirm dose/schedule in your pharmacy ordering system**  Patient Characteristics: Relapsed or Progressive Disease, Second Line, Relapse < 3 Months Therapeutic Status: Relapsed or Progressive Disease Line of Therapy: Second Line Time to Relapse: Relapse < 3 Months Intent of Therapy: Non-Curative / Palliative Intent, Discussed with Patient

## 2019-06-03 ENCOUNTER — Other Ambulatory Visit: Payer: Self-pay | Admitting: *Deleted

## 2019-06-03 ENCOUNTER — Ambulatory Visit
Admission: RE | Admit: 2019-06-03 | Discharge: 2019-06-03 | Disposition: A | Discharge status: Home / Self Care | Payer: Medicaid Other | Source: Ambulatory Visit | Attending: Radiation Oncology | Admitting: Radiation Oncology

## 2019-06-03 ENCOUNTER — Telehealth: Payer: Self-pay | Admitting: Hematology

## 2019-06-03 ENCOUNTER — Other Ambulatory Visit: Payer: Self-pay

## 2019-06-03 DIAGNOSIS — Z510 Encounter for antineoplastic radiation therapy: Secondary | ICD-10-CM | POA: Diagnosis not present

## 2019-06-03 LAB — T4: T4, Total: 8.6 ug/dL (ref 4.5–12.0)

## 2019-06-03 NOTE — Telephone Encounter (Signed)
Called patient about scheduled appts and he is aware of his scheduled appt dates and time.

## 2019-06-04 ENCOUNTER — Other Ambulatory Visit: Payer: Self-pay

## 2019-06-04 ENCOUNTER — Telehealth: Payer: Self-pay | Admitting: *Deleted

## 2019-06-04 ENCOUNTER — Encounter: Payer: Self-pay | Admitting: General Practice

## 2019-06-04 ENCOUNTER — Ambulatory Visit
Admission: RE | Admit: 2019-06-04 | Discharge: 2019-06-04 | Disposition: A | Discharge status: Home / Self Care | Payer: Medicaid Other | Source: Ambulatory Visit | Attending: Radiation Oncology | Admitting: Radiation Oncology

## 2019-06-04 DIAGNOSIS — Z510 Encounter for antineoplastic radiation therapy: Secondary | ICD-10-CM | POA: Diagnosis not present

## 2019-06-04 NOTE — Telephone Encounter (Signed)
Late entry:9/9 contacted wife/patient to inform that [place] unable to process prescription from Dr. Irene Limbo due to patient's current lack of insurance. Will follow up with [place] Worker to investigate available options. Wife verbalized understanding.

## 2019-06-04 NOTE — Telephone Encounter (Signed)
Contacted Nelda Bucks., [place] Social Worker. She is researching resources available for patient and will contact office and/or patient with information. Attempted to contact patient/wife - LVM with this information.

## 2019-06-04 NOTE — Progress Notes (Signed)
[place] CSW Progress Notes  Message from [employer].  Patient uninsured but in need of DME including hospital bed and bedside commode.  Wonders if there are resources for this.  Called patient, has applied in the past and "was denied."  Is working w [place] caseworker - Sharlet Salina. Medicaid application is not complete at this time - patient is responding to requests for additional documents requested by DSS, unclear when he will be fully approved.  CSW has reached out to Mr Evelene Croon for status update.   Has already been approved for disability (March 2020) - is in the 5 month waiting period for receipt of benefits.  Will begin to receive disability benefits in Oct 2020.    CSW will investigate if there are options for charity DME, will communicate findings w desk nurse.  Edwyna Shell, LCSW Clinical Social Worker Phone:  [phone] Cell:  [phone]

## 2019-06-05 ENCOUNTER — Other Ambulatory Visit: Payer: Self-pay

## 2019-06-05 ENCOUNTER — Ambulatory Visit
Admission: RE | Admit: 2019-06-05 | Discharge: 2019-06-05 | Disposition: A | Discharge status: Home / Self Care | Payer: Medicaid Other | Source: Ambulatory Visit | Attending: Radiation Oncology | Admitting: Radiation Oncology

## 2019-06-05 ENCOUNTER — Encounter: Payer: Self-pay | Admitting: General Practice

## 2019-06-05 ENCOUNTER — Encounter: Payer: Self-pay | Admitting: *Deleted

## 2019-06-05 DIAGNOSIS — Z510 Encounter for antineoplastic radiation therapy: Secondary | ICD-10-CM | POA: Diagnosis not present

## 2019-06-05 NOTE — Progress Notes (Signed)
Oncology Nurse Navigator Documentation  Oncology Nurse Navigator Flowsheets 06/05/2019  Navigator Location [facility]  Navigator Encounter Type Letter/Fax/Email;Other  Treatment Initiated Date 06/09/2019  Patient Visit Type -  Treatment Phase Pre-Tx/Tx Discussion  Barriers/Navigation Needs Education;Financial/I was able to find financial resource for patient and mail him the information.   Education Concerns with Finances/ Eligibility;Other  Interventions Education;Other  Referrals -  Education Method Written  Acuity Level 2  Time Spent with Patient 45

## 2019-06-05 NOTE — Progress Notes (Signed)
[place] CSW Progress Notes  Donated bedside commode available for patient - was given to [place] for patient by [employer] [facility].  Currently stored in [place] area and patient can take home with him.  Call from [place] NP to discuss palliative care's efforts to obtain DME for patient.  Have another bedside commode at their thrift store in [place] available to patient/famly to pick up.  Also have found hospital beds for safe in other thrift stores.  Aware that patient is uninsured at this point.  Palliative care NP standing by to assist as needed w medical needs for patient.  CSW also discussed status of patient's Medicaid application - based on update from Sharlet Salina, [place] worker handling Medicaid application, he is awaiting requested information from patient prior to being able to submit application.  As of now, patient does not have Medicaid as a resource - this may change in the future. Communication sent to Dr Pollie Meyer desk nurse w information on status of DME request needs/availability.  Edwyna Shell, LCSW Clinical Social Worker Phone:  [phone] Cell:  [phone]

## 2019-06-05 NOTE — Progress Notes (Signed)
  Radiation Oncology         [phone] ________________________________  Name: Rylee Nuzum MRN: [id number]  Date: 05/22/2019  DOB: 1955-11-27  SIMULATION AND TREATMENT PLANNING NOTE  DIAGNOSIS:     ICD-10-CM   1. Bone metastases ([place])  C79.51      Site:   1.  C7 spine 2.  L1 spine  NARRATIVE:  The patient was brought to the [facility].  Identity was confirmed.  All relevant records and images related to the planned course of therapy were reviewed.   Written consent to proceed with treatment was confirmed which was freely given after reviewing the details related to the planned course of therapy had been reviewed with the patient.  Then, the patient was set-up in a stable reproducible  supine position for radiation therapy.  CT images were obtained.  Surface markings were placed.    Medically necessary complex treatment device(s) for immobilization:   1.  Thermoplastic mask 2.  accuform device 3.  Vac-lock bag.   The CT images were loaded into the planning software.  Then the target and avoidance structures were contoured.  Treatment planning then occurred.  The radiation prescription was entered and confirmed.  A total of 4 complex treatment devices were fabricated which relate to the designed radiation treatment fields. Each of these customized fields/ complex treatment devices will be used on a daily basis during the radiation course. I have requested : Isodose Plan.   PLAN:  The patient will receive 30 Gy in 10 fractions to each of the 2 separate target sites above.  ________________________________   Jodelle Gross, MD, PhD

## 2019-06-08 ENCOUNTER — Inpatient Hospital Stay: Payer: Medicaid Other

## 2019-06-08 ENCOUNTER — Other Ambulatory Visit: Payer: Self-pay

## 2019-06-08 ENCOUNTER — Other Ambulatory Visit: Payer: Self-pay | Admitting: Hematology

## 2019-06-08 ENCOUNTER — Ambulatory Visit
Admission: RE | Admit: 2019-06-08 | Discharge: 2019-06-08 | Disposition: A | Discharge status: Home / Self Care | Payer: Medicaid Other | Source: Ambulatory Visit | Attending: Radiation Oncology | Admitting: Radiation Oncology

## 2019-06-08 VITALS — BP 121/72 | HR 68 | Temp 98.2°F | Resp 18

## 2019-06-08 DIAGNOSIS — Z5111 Encounter for antineoplastic chemotherapy: Secondary | ICD-10-CM | POA: Diagnosis not present

## 2019-06-08 DIAGNOSIS — C3490 Malignant neoplasm of unspecified part of unspecified bronchus or lung: Secondary | ICD-10-CM

## 2019-06-08 DIAGNOSIS — Z7189 Other specified counseling: Secondary | ICD-10-CM

## 2019-06-08 DIAGNOSIS — Z510 Encounter for antineoplastic radiation therapy: Secondary | ICD-10-CM | POA: Diagnosis not present

## 2019-06-08 DIAGNOSIS — C7951 Secondary malignant neoplasm of bone: Secondary | ICD-10-CM

## 2019-06-08 MED ORDER — DENOSUMAB 120 MG/1.7ML ~~LOC~~ SOLN
SUBCUTANEOUS | Status: AC
  Filled 2019-06-08: qty 1.7

## 2019-06-08 MED ORDER — DENOSUMAB 120 MG/1.7ML ~~LOC~~ SOLN
120.0000 mg | Freq: Once | SUBCUTANEOUS | Status: AC
  Administered 2019-06-08: 13:00:00 120 mg via SUBCUTANEOUS

## 2019-06-08 MED ORDER — ONDANSETRON HCL 8 MG PO TABS: 8.0000 mg | ORAL_TABLET | Freq: Two times a day (BID) | ORAL | 1 refills | Status: DC | PRN

## 2019-06-08 MED ORDER — PROCHLORPERAZINE MALEATE 10 MG PO TABS: 10.0000 mg | ORAL_TABLET | Freq: Four times a day (QID) | ORAL | 1 refills | Status: DC | PRN

## 2019-06-08 MED ORDER — LIDOCAINE-PRILOCAINE 2.5-2.5 % EX CREA: TOPICAL_CREAM | CUTANEOUS | 3 refills | Status: DC

## 2019-06-08 NOTE — Patient Instructions (Signed)
Denosumab injection What is this medicine? DENOSUMAB (den oh sue mab) slows bone breakdown. Prolia is used to treat osteoporosis in women after menopause and in men, and in people who are taking corticosteroids for 6 months or more. Xgeva is used to treat a high calcium level due to cancer and to prevent bone fractures and other bone problems caused by multiple myeloma or cancer bone metastases. Xgeva is also used to treat giant cell tumor of the bone. This medicine may be used for other purposes; ask your health care provider or pharmacist if you have questions. COMMON BRAND NAME(S): Prolia, XGEVA What should I tell my health care provider before I take this medicine? They need to know if you have any of these conditions:  dental disease  having surgery or tooth extraction  infection  kidney disease  low levels of calcium or Vitamin D in the blood  malnutrition  on hemodialysis  skin conditions or sensitivity  thyroid or parathyroid disease  an unusual reaction to denosumab, other medicines, foods, dyes, or preservatives  pregnant or trying to get pregnant  breast-feeding How should I use this medicine? This medicine is for injection under the skin. It is given by a health care professional in a hospital or clinic setting. A special MedGuide will be given to you before each treatment. Be sure to read this information carefully each time. For Prolia, talk to your pediatrician regarding the use of this medicine in children. Special care may be needed. For Xgeva, talk to your pediatrician regarding the use of this medicine in children. While this drug may be prescribed for children as young as 13 years for selected conditions, precautions do apply. Overdosage: If you think you have taken too much of this medicine contact a poison control center or emergency room at once. NOTE: This medicine is only for you. Do not share this medicine with others. What if I miss a dose? It is  important not to miss your dose. Call your doctor or health care professional if you are unable to keep an appointment. What may interact with this medicine? Do not take this medicine with any of the following medications:  other medicines containing denosumab This medicine may also interact with the following medications:  medicines that lower your chance of fighting infection  steroid medicines like prednisone or cortisone This list may not describe all possible interactions. Give your health care provider a list of all the medicines, herbs, non-prescription drugs, or dietary supplements you use. Also tell them if you smoke, drink alcohol, or use illegal drugs. Some items may interact with your medicine. What should I watch for while using this medicine? Visit your doctor or health care professional for regular checks on your progress. Your doctor or health care professional may order blood tests and other tests to see how you are doing. Call your doctor or health care professional for advice if you get a fever, chills or sore throat, or other symptoms of a cold or flu. Do not treat yourself. This drug may decrease your body's ability to fight infection. Try to avoid being around people who are sick. You should make sure you get enough calcium and vitamin D while you are taking this medicine, unless your doctor tells you not to. Discuss the foods you eat and the vitamins you take with your health care professional. See your dentist regularly. Brush and floss your teeth as directed. Before you have any dental work done, tell your dentist you are   receiving this medicine. Do not become pregnant while taking this medicine or for 5 months after stopping it. Talk with your doctor or health care professional about your birth control options while taking this medicine. Women should inform their doctor if they wish to become pregnant or think they might be pregnant. There is a potential for serious side  effects to an unborn child. Talk to your health care professional or pharmacist for more information. What side effects may I notice from receiving this medicine? Side effects that you should report to your doctor or health care professional as soon as possible:  allergic reactions like skin rash, itching or hives, swelling of the face, lips, or tongue  bone pain  breathing problems  dizziness  jaw pain, especially after dental work  redness, blistering, peeling of the skin  signs and symptoms of infection like fever or chills; cough; sore throat; pain or trouble passing urine  signs of low calcium like fast heartbeat, muscle cramps or muscle pain; pain, tingling, numbness in the hands or feet; seizures  unusual bleeding or bruising  unusually weak or tired Side effects that usually do not require medical attention (report to your doctor or health care professional if they continue or are bothersome):  constipation  diarrhea  headache  joint pain  loss of appetite  muscle pain  runny nose  tiredness  upset stomach This list may not describe all possible side effects. Call your doctor for medical advice about side effects. You may report side effects to FDA at 1-800-FDA-1088. Where should I keep my medicine? This medicine is only given in a clinic, doctor's office, or other health care setting and will not be stored at home. NOTE: This sheet is a summary. It may not cover all possible information. If you have questions about this medicine, talk to your doctor, pharmacist, or health care provider.  2020 Elsevier/Gold Standard (2018-01-17 16:10:44)

## 2019-06-09 ENCOUNTER — Other Ambulatory Visit: Payer: Self-pay

## 2019-06-09 ENCOUNTER — Inpatient Hospital Stay: Payer: Medicaid Other

## 2019-06-09 ENCOUNTER — Encounter: Payer: Self-pay | Admitting: Radiation Oncology

## 2019-06-09 ENCOUNTER — Inpatient Hospital Stay: Payer: Medicaid Other | Admitting: Nutrition

## 2019-06-09 ENCOUNTER — Ambulatory Visit
Admission: RE | Admit: 2019-06-09 | Discharge: 2019-06-09 | Disposition: A | Discharge status: Home / Self Care | Payer: Medicaid Other | Source: Ambulatory Visit | Attending: Radiation Oncology | Admitting: Radiation Oncology

## 2019-06-09 ENCOUNTER — Encounter: Payer: Self-pay | Admitting: *Deleted

## 2019-06-09 VITALS — BP 101/71 | HR 89 | Temp 98.3°F | Resp 16

## 2019-06-09 DIAGNOSIS — C3490 Malignant neoplasm of unspecified part of unspecified bronchus or lung: Secondary | ICD-10-CM

## 2019-06-09 DIAGNOSIS — Z510 Encounter for antineoplastic radiation therapy: Secondary | ICD-10-CM | POA: Diagnosis not present

## 2019-06-09 DIAGNOSIS — Z5111 Encounter for antineoplastic chemotherapy: Secondary | ICD-10-CM | POA: Diagnosis not present

## 2019-06-09 DIAGNOSIS — Z7189 Other specified counseling: Secondary | ICD-10-CM

## 2019-06-09 LAB — CBC WITH DIFFERENTIAL/PLATELET
Abs Immature Granulocytes: 0.04 10*3/uL (ref 0.00–0.07)
Basophils Absolute: 0 10*3/uL (ref 0.0–0.1)
Basophils Relative: 0 %
Eosinophils Absolute: 0 10*3/uL (ref 0.0–0.5)
Eosinophils Relative: 1 %
HCT: 42.5 % (ref 39.0–52.0)
Hemoglobin: 14.7 g/dL (ref 13.0–17.0)
Immature Granulocytes: 1 %
Lymphocytes Relative: 10 %
Lymphs Abs: 0.5 10*3/uL — ABNORMAL LOW (ref 0.7–4.0)
MCH: 32.7 pg (ref 26.0–34.0)
MCHC: 34.6 g/dL (ref 30.0–36.0)
MCV: 94.4 fL (ref 80.0–100.0)
Monocytes Absolute: 0.6 10*3/uL (ref 0.1–1.0)
Monocytes Relative: 11 %
Neutro Abs: 4.4 10*3/uL (ref 1.7–7.7)
Neutrophils Relative %: 77 %
Platelets: 153 10*3/uL (ref 150–400)
RBC: 4.5 MIL/uL (ref 4.22–5.81)
RDW: 12.2 % (ref 11.5–15.5)
WBC: 5.7 10*3/uL (ref 4.0–10.5)
nRBC: 0 % (ref 0.0–0.2)

## 2019-06-09 LAB — CMP (CANCER CENTER ONLY)
ALT: 68 U/L — ABNORMAL HIGH (ref 0–44)
AST: 94 U/L — ABNORMAL HIGH (ref 15–41)
Albumin: 3.7 g/dL (ref 3.5–5.0)
Alkaline Phosphatase: 204 U/L — ABNORMAL HIGH (ref 38–126)
Anion gap: 16 — ABNORMAL HIGH (ref 5–15)
BUN: 21 mg/dL (ref 8–23)
CO2: 22 mmol/L (ref 22–32)
Calcium: 9.3 mg/dL (ref 8.9–10.3)
Chloride: 93 mmol/L — ABNORMAL LOW (ref 98–111)
Creatinine: 0.79 mg/dL (ref 0.61–1.24)
GFR, Est AFR Am: >60 mL/min (ref >60)
GFR, Estimated: >60 mL/min (ref >60)
Glucose, Bld: 91 mg/dL (ref 70–99)
Potassium: 4.1 mmol/L (ref 3.5–5.1)
Sodium: 131 mmol/L — ABNORMAL LOW (ref 135–145)
Total Bilirubin: 1.2 mg/dL (ref 0.3–1.2)
Total Protein: 7.1 g/dL (ref 6.5–8.1)

## 2019-06-09 MED ORDER — SODIUM CHLORIDE 0.9 % IV SOLN
Freq: Once | INTRAVENOUS | Status: AC
  Administered 2019-06-09: 11:00:00 via INTRAVENOUS
  Filled 2019-06-09: qty 250

## 2019-06-09 MED ORDER — TOPOTECAN HCL CHEMO INJECTION 4 MG
3.0000 mg/m2 | Freq: Once | INTRAVENOUS | Status: AC
  Administered 2019-06-09: 12:00:00 5.7 mg via INTRAVENOUS
  Filled 2019-06-09: qty 5.7

## 2019-06-09 MED ORDER — PROCHLORPERAZINE MALEATE 10 MG PO TABS
ORAL_TABLET | ORAL | Status: AC
  Filled 2019-06-09: qty 1

## 2019-06-09 MED ORDER — PROCHLORPERAZINE MALEATE 10 MG PO TABS
10.0000 mg | ORAL_TABLET | Freq: Once | ORAL | Status: AC
  Administered 2019-06-09: 11:00:00 10 mg via ORAL

## 2019-06-09 NOTE — Progress Notes (Signed)
Patient was identified to be at risk for malnutrition on the MST secondary to poor appetite and weight loss.  63 year old male diagnosed with small cell lung cancer receiving chemotherapy.  He completes radiation therapy today.  Past medical history includes alcohol abuse.  Medications include B complex, vitamin D, Decadron, Colace, Ativan, multivitamin, and MiraLAX.  Labs include sodium 131 September 15.  Height: 5 feet 10 inches Weight: 159.1 pounds September 4. Usual body weight: 180-185 pounds. BMI: 22.83.  Patient admits to weight loss and attributes it to difficulty/painful swallowing from radiation therapy. He denies nausea, vomiting, and constipation. Says he has tried all kinds of nutrition drinks but does not care for them. Reports he knows what he needs to do.  Nutrition diagnosis: Unintended weight loss related to inadequate oral intake as evidenced by 21 pound weight loss from usual body weight.  Intervention: Brief education provided on consuming small, frequent meals and snacks with adequate calories and protein. Reviewed importance of soft protein foods and the function of protein and healing. Provided fact sheets. Gave patient name and phone number for further questions.  Monitoring, evaluation, goals: Patient will tolerate adequate calories and protein to minimize further weight loss.  Patient will contact me with questions or concerns.  **Disclaimer: This note was dictated with voice recognition software. Similar sounding words can inadvertently be transcribed and this note may contain transcription errors which may not have been corrected upon publication of note.**

## 2019-06-09 NOTE — Patient Instructions (Signed)
[facility] Discharge Instructions for Patients Receiving Chemotherapy  Today you received the following chemotherapy agents: topotecan.  To help prevent nausea and vomiting after your treatment, we encourage you to take your nausea medication as prescribed by your physician.    If you develop nausea and vomiting that is not controlled by your nausea medication, call the clinic.   BELOW ARE SYMPTOMS THAT SHOULD BE REPORTED IMMEDIATELY:  *FEVER GREATER THAN 100.5 F  *CHILLS WITH OR WITHOUT FEVER  NAUSEA AND VOMITING THAT IS NOT CONTROLLED WITH YOUR NAUSEA MEDICATION  *UNUSUAL SHORTNESS OF BREATH  *UNUSUAL BRUISING OR BLEEDING  TENDERNESS IN MOUTH AND THROAT WITH OR WITHOUT PRESENCE OF ULCERS  *URINARY PROBLEMS  *BOWEL PROBLEMS  UNUSUAL RASH Items with * indicate a potential emergency and should be followed up as soon as possible.  Feel free to call the clinic should you have any questions or concerns. The clinic phone number is [phone].  Please show the [facility] at check-in to the Emergency Department and triage nurse.  Topotecan injection What is this medicine? TOPOTECAN (TOE poe TEE kan) is a chemotherapy drug. It is used to treat lung cancer, ovarian cancer, and cervical cancer. This medicine may be used for other purposes; ask your health care provider or pharmacist if you have questions. COMMON BRAND NAME(S): Hycamtin What should I tell my health care provider before I take this medicine? They need to know if you have any of these conditions:  immune system problems  infection (especially a virus infection such as chickenpox, cold sores, or herpes)  kidney disease  low blood counts, like low white cell, platelet, or red cell counts  lung or breathing disease, like asthma  scarring or thickening of the lungs  an unusual or allergic reaction to topotecan, other medicines, foods, dyes, or preservatives  pregnant or trying to get  pregnant  breast-feeding How should I use this medicine? This medicine is for infusion into a vein. It is usually given by a health care professional in a hospital or clinic setting. Talk to your pediatrician regarding the use of this medicine in children. Special care may be needed. Overdosage: If you think you have taken too much of this medicine contact a poison control center or emergency room at once. NOTE: This medicine is only for you. Do not share this medicine with others. What if I miss a dose? It is important not to miss your dose. Call your doctor or health care professional if you are unable to keep an appointment. What may interact with this medicine?  amiodarone  azithromycin  captopril  carvedilol  certain medications for fungal infections like ketoconazole and itraconazole  clarithromycin  conivaptan  cyclosporine  dronedarone  eltrombopag  erythromycin  felodipine  grapefruit juice  lopinavir  quercetin  quinidine  ranolazine  ritonavir  ticagrelor  verapamil This list may not describe all possible interactions. Give your health care provider a list of all the medicines, herbs, non-prescription drugs, or dietary supplements you use. Also tell them if you smoke, drink alcohol, or use illegal drugs. Some items may interact with your medicine. What should I watch for while using this medicine? This drug may make you feel generally unwell. This is not uncommon, as chemotherapy can affect healthy cells as well as cancer cells. Report any side effects. Continue your course of treatment even though you feel ill unless your doctor tells you to stop. Call your doctor or health care professional for advice  if you get a fever, chills or sore throat, or other symptoms of a cold or flu. Do not treat yourself. This drug decreases your body's ability to fight infections. Try to avoid being around people who are sick. This medicine may increase your risk to  bruise or bleed. Call your doctor or health care professional if you notice any unusual bleeding. Be careful brushing and flossing your teeth or using a toothpick because you may get an infection or bleed more easily. If you have any dental work done, tell your dentist you are receiving this medicine. Avoid taking products that contain aspirin, acetaminophen, ibuprofen, naproxen, or ketoprofen unless instructed by your doctor. These medicines may hide a fever. Do not become pregnant while taking this medicine or for 6 months after stopping it. Women should inform their doctor if they wish to become pregnant or think they might be pregnant. Men should not father a child while taking this medicine and for 3 months after stopping it. There is a potential for serious side effects to an unborn child. Talk to your health care professional or pharmacist for more information. Do not breast-feed an infant while taking this medicine. In females, this medicine may make it more difficult to get pregnant. This medicine has also caused reduced sperm counts in some men. This may interfere with the ability to father a child. You should talk to your doctor or health care professional if you are concerned about your fertility. What side effects may I notice from receiving this medicine? Side effects that you should report to your doctor or health care professional as soon as possible:  allergic reactions like skin rash, itching or hives, swelling of the face, lips, or tongue  breathing difficulties  diarrhea  signs of decreased platelets or bleeding - bruising, pinpoint red spots on the skin, black, tarry stools, blood in the urine  signs of decreased red blood cells - unusually weak or tired, feeling faint or lightheaded, falls  signs and symptoms of infection like fever or chills; cough; sore throat; pain or trouble passing urine Side effects that usually do not require medical attention (report to your doctor or  health care professional if they continue or are bothersome):  hair loss  headache  loss of appetite  nausea, vomiting  stomach pain This list may not describe all possible side effects. Call your doctor for medical advice about side effects. You may report side effects to FDA at 1-800-FDA-1088. Where should I keep my medicine? Keep out of the reach of children. This drug is usually given in a hospital or clinic and will not be stored at home. In rare cases, this medicine may be given at home. If you are using this medicine at home, you will be instructed on how to store this medicine. Throw away any unused medicine after the expiration date on the label. NOTE: This sheet is a summary. It may not cover all possible information. If you have questions about this medicine, talk to your doctor, pharmacist, or health care provider.  2020 [employer] Standard (2017-06-06 11:35:12)

## 2019-06-09 NOTE — Progress Notes (Signed)
[place] Work  Patient was unable to take bedside commode home with him today.  CSW contacted patient and patients wife at home.  Patients wife said she would come to [facility] to pick bedside commode Wednesday or Thursday.  Patients wife will call CSW with what time and day.    Johnnye Lana, MSW, LCSW, OSW-C Clinical Social Worker [facility] [phone]

## 2019-06-10 ENCOUNTER — Telehealth: Payer: Self-pay | Admitting: *Deleted

## 2019-06-10 DIAGNOSIS — C3490 Malignant neoplasm of unspecified part of unspecified bronchus or lung: Secondary | ICD-10-CM

## 2019-06-10 DIAGNOSIS — Z7189 Other specified counseling: Secondary | ICD-10-CM

## 2019-06-10 MED ORDER — PROCHLORPERAZINE MALEATE 10 MG PO TABS: 10.0000 mg | ORAL_TABLET | Freq: Four times a day (QID) | ORAL | 1 refills | Status: AC | PRN

## 2019-06-10 MED ORDER — LIDOCAINE-PRILOCAINE 2.5-2.5 % EX CREA: TOPICAL_CREAM | CUTANEOUS | 3 refills | Status: AC

## 2019-06-10 MED ORDER — ONDANSETRON HCL 8 MG PO TABS: 8.0000 mg | ORAL_TABLET | Freq: Two times a day (BID) | ORAL | 1 refills | Status: AC | PRN

## 2019-06-10 MED FILL — PROCHLORPERAZINE 10 MG TAB: 10 | 8 days supply | Qty: 30 | Fill #0

## 2019-06-10 NOTE — Telephone Encounter (Signed)
Patient wife called - asked that new prescriptions for Compazine, Zofran and EMLA creme be sent to [facility] instead of to [facility]. They are only using [facility] pharmacy now. Please remove [facility] and [facility].  Prescriptions resent to [facility] as requested. [facility] and [facility] removed from chart as requested. Contacted [facility] to notify that prescriptions now sent to another pharmacy. They verbalized understanding and stated they will return meds to stock.

## 2019-06-12 ENCOUNTER — Encounter: Payer: Self-pay | Admitting: General Practice

## 2019-06-12 NOTE — Progress Notes (Signed)
[place] CSW Progress Notes  Email from [facility] - they have mailed patient gas card as part of their outreach program  Edwyna Shell, [place] Worker Phone:  [phone]

## 2019-06-15 NOTE — Progress Notes (Signed)
HEMATOLOGY/ONCOLOGY CLINIC NOTE  Date of Service: 06/16/2019  Patient Care Team: Default, Provider, MD as PCP - General  CHIEF COMPLAINTS/PURPOSE OF CONSULTATION:  F/u for Small Cell Lung Cancer  HISTORY OF PRESENTING ILLNESS:  Cody Kakar Stubitsis a 63 y.o.malewith medical history significant ofalcohol abuse.He does not have a primary care provider and does not seek routine medical care. Around 3 weeks ago, the patient started having epigastric abdominal pain. It would wax and wane in severity but progressively worsenedsince onset. He has had associated nausea and vomiting. His appetite has decreased and he is lost around 10 pounds. He stopped drinking when this happened. He would drink around 7-8 shots of vodka daily in addition to the 2-3 beers. He has done this for the past 4-5 years. He does not have any known history of hepatitis B or C and denies taking any medication/supplements routinely. He denies any prominent vessels, yellowing of the skin/eyes, fevers, shortness of breath, or bowel changes. The patient has been using ibuprofen for pain with little relief.  In the emergency room, a CT scan showsmultifocal hepatocellular carcinoma that is not appear to be metastatic to the liver. There is also thoracic adenopathy,omental/peritoneal involvement and left pleural effusion suggestive of metastatic disease. The oncology team was called and recommended admission for work-up.   When seen today, the patient reports that he continues to have abdominal discomfort in his upper abdomen.  Along with the discomfort in his abdomen, he also reports nausea and vomiting and increased constipation.  This is been present for about 2 to 3 weeks.  He states his appetite has been decreased and he has lost weight.  He estimates that he has lost about 10 pounds over the past few months.  Oncology was asked to see the patient to make recommendations regarding his liver  masses.  Interval History:   Cody Montoya is here for follow-up of his extensive stage small cell lung cancer. He is here for a toxicity check on his 2nd weekly dose of Topotecan. The patient's last visit with [place] was on 06/02/2019. He is joined by his ex-wife, [place]. The pt reports that he is doing well overall.  The pt reports he has been feeling tired, weak, cold and he is having trouble eating due to a lump in his throat. He has lost 25 lbs over the last month. He reports that he has been using a walker to get around but not has been able to manage much movement. His back pain has improved and the numbness in his right abdominal has lessened in severity. He is currently taking Oxycodone 40mg  twice a day. Lovey Newcomer reports that he has been throwing up and coughing up thick phelgm. She confirms that he is not eating and drinking enough and is moving very little.   Lab results today (06/16/19) of CBC w/diff and CMP is as follows: all values are WNL except for WBC at 2.3K, PLTs at 59K, Neutro Abs at 1.6K, Lymphs Abs at 0.3K, Sodium at 134, Chloride at 96, Glucose at 104, BUN at 29, AST at 85, ALT at 63, Alkaline Phosphatase at 197.  On review of systems, pt reports chills, fatigue, weakness, limited ability to eat and drink, chills, improved back pain, improved right abdominal numbness and denies leg swelling and any other symptoms.    MEDICAL HISTORY:  Past Medical History:  Diagnosis Date   Cancer [facility])     SURGICAL HISTORY: Past Surgical History:  Procedure Laterality Date  NO PAST SURGERIES     RADIOLOGY WITH ANESTHESIA N/A 05/22/2019   Procedure: MRI CERVICAL SPINE AND THORACIC SPINE WITH AND WITHOUT CONTRAST, WITH ANESTHESIA;  Surgeon: Radiologist, Medication, MD;  Location: [place];  Service: Radiology;  Laterality: N/A;    SOCIAL HISTORY: Social History   Socioeconomic History   Marital status: Single    Spouse name: Not on file   Number of children: Not on file    Years of education: Not on file   Highest education level: Not on file  Occupational History   Occupation: Surveyor, quantity  Social Needs   Financial resource strain: Not very hard   Food insecurity    Worry: Never true    Inability: Never true   Transportation needs    Medical: No    Non-medical: No  Tobacco Use   Smoking status: Current Every Day Smoker    Packs/day: 1.00    Years: 45.00    Pack years: 45.00    Types: Cigarettes   Smokeless tobacco: Never Used  Substance and Sexual Activity   Alcohol use: Not Currently    Alcohol/week: 8.0 standard drinks    Types: 3 Cans of beer, 5 Shots of liquor per week    Comment: no alcohol in past month due to symptoms   Drug use: Not Currently    Frequency: 7.0 times per week    Types: Marijuana   Sexual activity: Not Currently  Lifestyle   Physical activity    Days per week: 5 days    Minutes per session: 150+ min   Stress: To some extent  Relationships   Social connections    Talks on phone: More than three times a week    Gets together: More than three times a week    Attends religious service: Never    Active member of club or organization: No    Attends meetings of clubs or organizations: Never    Relationship status: Living with partner   Intimate partner violence    Fear of current or ex partner: No    Emotionally abused: No    Physically abused: No    Forced sexual activity: No  Other Topics Concern   Not on file  Social History Narrative   Not on file    FAMILY HISTORY: Family History  Problem Relation Age of Onset   Breast cancer Sister    Breast cancer Sister     ALLERGIES:  has No Known Allergies.  MEDICATIONS:  Current Outpatient Medications  Medication Sig Dispense Refill   acetaminophen (TYLENOL) 500 MG tablet Take 2 tablets (1,000 mg total) by mouth every 8 (eight) hours as needed for moderate pain. 30 tablet 0   B Complex Vitamins SOLN Take 1 mL by mouth daily.      Calcium 600-200 MG-UNIT tablet Take 1 tablet by mouth daily.     Cholecalciferol (VITAMIN D) 50 MCG (2000 UT) CAPS Take 2,000 Units by mouth daily.     dexamethasone (DECADRON) 1 MG tablet Take #2mg  for 5 days, then take #1mg  for 5 days, then stop 15 tablet 0   dimenhyDRINATE (DRAMAMINE) 50 MG tablet Take 50 mg by mouth every 8 (eight) hours as needed for dizziness.     docusate sodium (COLACE) 100 MG capsule Take 1 capsule (100 mg total) by mouth 2 (two) times daily. (Patient taking differently: Take 200 mg by mouth daily. ) 10 capsule 0   lidocaine-prilocaine (EMLA) cream Apply to affected area once 30 g  3   LORazepam (ATIVAN) 0.5 MG tablet 1 tab po q 4-6 hours prn or 1 tab po 30 minutes prior to MRI or radiation (Patient taking differently: Take 0.5 mg by mouth every 6 (six) hours as needed for anxiety or sedation. ) 10 tablet 0   memantine (NAMENDA) 5 MG tablet Start on first day of XRT: Week 1: take 1 tablet po q am Week 2: take 1 tablet po q am and 1 tablet po q pm Week 3: take 2 tablets po q am, and 1 tablets po q pm Week 4-24: take 2 tablets po q am, and 2 tablets po q pm.  Stop after 24 weeks 10/09/2019 should be last day 120 tablet 3   Menthol (BIOFREEZE) 10.5 % AERO Apply 1 application topically every 4 (four) hours as needed (pain).     morphine (MSIR) 15 MG tablet Take 1 tablet (15 mg total) by mouth every 3 (three) hours as needed for moderate pain or severe pain. 90 tablet 0   Multiple Vitamin (MULTIVITAMIN WITH MINERALS) TABS tablet Take 1 tablet by mouth daily.     ondansetron (ZOFRAN) 8 MG tablet Take 1 tablet (8 mg total) by mouth 2 (two) times daily as needed (Nausea or vomiting). 30 tablet 1   oxyCODONE (OXYCONTIN) 40 mg 12 hr tablet Take 1 tablet (40 mg total) by mouth every 12 (twelve) hours. 60 tablet 0   polyethylene glycol (MIRALAX / GLYCOLAX) packet Take 17 g by mouth daily as needed for mild constipation. (Patient taking differently: Take 17 g by mouth daily. ) 14  each 0   prochlorperazine (COMPAZINE) 10 MG tablet Take 1 tablet (10 mg total) by mouth every 6 (six) hours as needed (Nausea or vomiting). 30 tablet 1   senna-docusate (SENOKOT-S) 8.6-50 MG tablet Take 2 tablets by mouth 2 (two) times daily. 120 tablet 0   No current facility-administered medications for this visit.     REVIEW OF SYSTEMS:    A 10+ POINT REVIEW OF SYSTEMS WAS OBTAINED including neurology, dermatology, psychiatry, cardiac, respiratory, lymph, extremities, GI, GU, Musculoskeletal, constitutional, breasts, reproductive, HEENT.  All pertinent positives are noted in the HPI.  All others are negative.   PHYSICAL EXAMINATION: ECOG PERFORMANCE STATUS: 1-2  .BP 97/69 (BP Location: Right Arm, Patient Position: Sitting)    Pulse 98    Temp 98 F (36.7 C) (Temporal)    Resp 18    Ht 5\' 10"  (1.778 m)    Wt 145 lb (65.8 kg)    SpO2 99%    BMI 20.81 kg/m   GENERAL:alert, in no acute distress and comfortable SKIN: no acute rashes, no significant lesions EYES: conjunctiva are pink and non-injected, sclera anicteric OROPHARYNX: MMM, no exudates, no oropharyngeal erythema or ulceration NECK: supple, no JVD LYMPH:  no palpable lymphadenopathy in the cervical, axillary or inguinal regions LUNGS: clear to auscultation b/l with normal respiratory effort HEART: regular rate & rhythm ABDOMEN:  normoactive bowel sounds , non tender, not distended. No palpable hepatosplenomegaly.  Extremity: no pedal edema PSYCH: alert & oriented x 3 with fluent speech NEURO: no focal motor/sensory deficits    LABORATORY DATA:  I have reviewed the data as listed  . CBC Latest Ref Rng & Units 06/16/2019 06/09/2019 06/02/2019  WBC 4.0 - 10.5 K/uL 2.3(L) 5.7 6.9  Hemoglobin 13.0 - 17.0 g/dL 14.0 14.7 13.7  Hematocrit 39.0 - 52.0 % 41.5 42.5 39.9  Platelets 150 - 400 K/uL 59(L) 153 163    .  CMP Latest Ref Rng & Units 06/16/2019 06/09/2019 06/02/2019  Glucose 70 - 99 mg/dL 104(H) 91 94  BUN 8 - 23 mg/dL  29(H) 21 13  Creatinine 0.61 - 1.24 mg/dL 0.74 0.79 0.70  Sodium 135 - 145 mmol/L 134(L) 131(L) 132(L)  Potassium 3.5 - 5.1 mmol/L 4.1 4.1 4.1  Chloride 98 - 111 mmol/L 96(L) 93(L) 98  CO2 22 - 32 mmol/L 23 22 24   Calcium 8.9 - 10.3 mg/dL 9.2 9.3 9.2  Total Protein 6.5 - 8.1 g/dL 7.2 7.1 6.8  Total Bilirubin 0.3 - 1.2 mg/dL 1.2 1.2 1.0  Alkaline Phos 38 - 126 U/L 197(H) 204(H) 162(H)  AST 15 - 41 U/L 85(H) 94(H) 70(H)  ALT 0 - 44 U/L 63(H) 68(H) 53(H)       RADIOGRAPHIC STUDIES: I have personally reviewed the radiological images as listed and agreed with the findings in the report. Ct Soft Tissue Neck W Contrast  Result Date: 05/20/2019 CLINICAL DATA:  Metastatic small cell carcinoma of the lung. Known brain and spinal metastases. Undergoing radiation therapy. EXAM: CT NECK WITH CONTRAST TECHNIQUE: Multidetector CT imaging of the neck was performed using the standard protocol following the bolus administration of intravenous contrast. CONTRAST:  158mL OMNIPAQUE IOHEXOL 300 MG/ML  SOLN COMPARISON:  None. FINDINGS: PHARYNX AND LARYNX: --Nasopharynx: Fossae of Rosenmuller are clear. Normal adenoid tonsils for age. --Oral cavity and oropharynx: The palatine and lingual tonsils are normal. The visible oral cavity and floor of mouth are normal. --Hypopharynx: Normal vallecula and pyriform sinuses. --Larynx: Normal epiglottis and pre-epiglottic space. Normal aryepiglottic and vocal folds. --Retropharyngeal space: No abscess, effusion or lymphadenopathy. SALIVARY GLANDS: --Parotid: No mass lesion or inflammation. No sialolithiasis or ductal dilatation. --Submandibular: Symmetric without inflammation. No sialolithiasis or ductal dilatation. --Sublingual: Normal. No ranula or other visible lesion of the base of tongue and floor of mouth. THYROID: Normal. LYMPH NODES: No enlarged or abnormal density cervical lymph nodes. VASCULAR: Major cervical vessels are patent. LIMITED INTRACRANIAL: Normal. VISUALIZED  ORBITS: Normal. MASTOIDS AND VISUALIZED PARANASAL SINUSES: No fluid levels or advanced mucosal thickening. No mastoid effusion. SKELETON: Metastatic lesions are again demonstrated within the vertebral bodies at C6 and C7. No pathologic fracture. There are multiple upper thoracic metastases as well. There are age-indeterminate fractures of the spinous processes at C6 and C7. UPPER CHEST: Mediastinal lymphadenopathy and left suprahilar lung mass are better characterized on the concomitant CT of the chest. OTHER: Low-attenuation subcutaneous left submental lesion is likely a sebaceous cyst. IMPRESSION: 1. No soft tissue mass or lymphadenopathy of the neck. 2. Multiple osseous metastatic lesions of the cervical and thoracic spine, better characterized on the recent thoracic spine MRI. 3. Age-indeterminate fractures of the spinous processes at C6 and C7. 4. Mediastinal lymphadenopathy and left suprahilar lung mass are better characterized on the concomitant CT of the chest. Electronically Signed   By: Ulyses Jarred M.D.   On: 05/20/2019 23:37   Ct Chest W Contrast  Result Date: 05/20/2019 CLINICAL DATA:  Metastatic small cell carcinoma EXAM: CT CHEST, ABDOMEN, AND PELVIS WITH CONTRAST TECHNIQUE: Multidetector CT imaging of the chest, abdomen and pelvis was performed following the standard protocol during bolus administration of intravenous contrast. CONTRAST:  143mL OMNIPAQUE IOHEXOL 300 MG/ML  SOLN COMPARISON:  MRI 05/16/2019, 659 2020, CT chest abdomen pelvis 03/03/2019, 12/01/2018 FINDINGS: CT CHEST FINDINGS Cardiovascular: Nonaneurysmal aorta. Mild aortic atherosclerosis. Coronary vascular calcification. Normal heart size. Small pericardial effusion. Tumor in case mint of left upper lobe pulmonary arterial vessels which appears significantly  narrowed. Mediastinum/Nodes: Incompletely visualized left supraclavicular nodes. Progression of mediastinal adenopathy, now with multiple bulky enlarged lymph nodes. Right  upper paratracheal lymph node measures 17 mm, not previously measured. Right paratracheal lymph node, series 2, image number 20 measures 25 mm. Substantially increased AP window node, measuring 23 mm, compared with 13 mm previously. Multiple additional paratracheal and precarinal nodes. Esophagus within normal limits. Lungs/Pleura: Irregular left upper lobe lung mass, significant progression compared to prior exam, contiguous with bulky left hilar/suprahilar mass. This measures approximately 5.6 cm AP by 4.6 transverse by 5 cm craniocaudad though precise measurements difficult as the mass is contiguous with mediastinal adenopathy. Interim development of small left-sided pleural effusion. 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 Musculoskeletal: Known widespread skeletal metastatic disease. Partially visualized sclerosis at C7 with possible pathologic fracture at the junction of the lamina with the spinous process. Sclerotic metastatic lesion at right T2 vertebral body and posterior elements with pathologic superior endplate fracture at T2. Metastatic lesions at T9, T10 and T11. New soft tissue mass surrounding the right ninth posterior rib, consistent with metastatic disease. CT ABDOMEN PELVIS FINDINGS Hepatobiliary: Innumerable hypodense metastatic lesions within the liver, progressed compared to prior. A dominant left lobe lesion measures 7.2 cm by 5.7 cm, and may correspond to a previously measured lesion in the region which measured 3 x 18 mm. The gallbladder is contracted. No calcified stone. No biliary dilatation. Pancreas: Unremarkable. No pancreatic ductal dilatation or surrounding inflammatory changes. Spleen: Normal in size without focal abnormality. Adrenals/Urinary Tract: Progressed nodular thickening of the left greater than right adrenal glands, also concerning for progression of disease. Kidneys show no hydronephrosis.  Subcentimeter hypodensity in the mid right kidney, too small to further characterize. The bladder is normal Stomach/Bowel: The stomach is nonenlarged. No dilated small bowel. No colon wall thickening. Vascular/Lymphatic: Moderate aortic atherosclerosis without aneurysm. Numerous enlarged porta hepatis, peripancreatic, portal caval and aortocaval nodes. Enlarged Peri aortic lymph nodes, measuring up to 2 cm, all progressed compared to prior. Reproductive: Prostate is unremarkable. Other: Negative for free air or free fluid. Small within the inguinal canals. Musculoskeletal: Stable sclerotic focus superior endplate L5. Stable faint sclerosis in the left iliac bone, adjacent lytic lesion is evident. IMPRESSION: 1. Large irregular left upper lobe mass, extending into the left hilar and suprahilar regions, consistent with disease recurrence. The mass invades the mediastinum and there is encasement of the left pulmonary arterial vessels by the soft tissue mass. 2. Significant progression of metastatic disease as evidence by worsened mediastinal adenopathy, hepatic metastatic disease, and upper abdominal and retroperitoneal adenopathy. Increased nodularity of the left greater than right adrenal glands, also concerning for progression of metastatic disease. 3. New small left pleural effusion, with interim finding of multiple left pleural based nodules, consistent with pleural metastatic disease. 4. Skeletal metastatic disease with spinal lesions better seen on MRI. Pathologic superior endplate fracture at T2. New soft tissue mass surrounding the right posterior ninth rib, also consistent with metastatic disease. Electronically Signed   By: Donavan Foil M.D.   On: 05/20/2019 23:52   Mr Cervical Spine W Wo Contrast  Result Date: 05/22/2019 CLINICAL DATA:  Metastatic disease. EXAM: MRI CERVICAL AND THORACIC SPINE WITHOUT AND WITH CONTRAST TECHNIQUE: Multiplanar and multiecho pulse sequences of the cervical and thoracic  spine were obtained without and with intravenous contrast. CONTRAST:  8 cc Gadavist intravenous COMPARISON:  Thoracic MRI from 6 days ago. FINDINGS: Cervical spine: Alignment: Normal Vertebrae:  Metastatic infiltration in the C2 spinous process, C4 body, C5 body, and nearly a confluent involvement of the C6-T2 vertebrae involving bodies and posterior elements. No pathologic fracture. There is epidural tumor extension along the right aspect of the canal at T1-2, which replaces the right foramen and contacts the cord. Cord: Ovoid mass within the cervical cord at the level of C7, 17 x 11 mm. Cord edema extends cranially and inferiorly throughout the cervical and upper thoracic cord. It is unclear if this is direct spread from the right foraminal disease at T1-2 or a hematogenous metastasis. Posterior Fossa, vertebral arteries, paraspinal tissues: Known adenopathy at the thoracic inlet. Disc levels: C4-5 small central disc protrusion with mild cord deformity. C5-6 disc narrowing with bulging and ridging causing biforaminal impingement and ventral cord flattening. C6-7 left paracentral protrusion and uncovertebral ridging left foraminal impingement. Central disc herniation flattens the ventral cord. Thoracic spine: Alignment: Normal Vertebrae: Innumerable metastases seen at every level within most confluent disease seen in the T1, T2, T8, T9, T11, and T12 vertebral bodies. Epidural extension in the right canal and foramen at T1-2 is noted above. No other epidural metastases are seen. Cord: Cord edema related to cervical spine deposit. There is a 4 mm enhancing nodule along the upper right cauda equina at the level of L1. Paraspinal tissues: Known mediastinal/left hilar adenopathy/mass. Disc levels: Spondylosis with bridging anterior osteophytes. There are small posterior disc herniations without cord compression. IMPRESSION: 1. 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 Extensive vasogenic edema in the cord. 2. 4 mm  nodule along the upper right cauda equina at the level of L1, which may indicate subarachnoid tumor spread. 3. Innumerable metastases throughout the cervical and thoracic spine most notable at T1-2 where there is right-sided epidural and foraminal infiltration. 4. C5-6 and C6-7 degenerative spinal stenosis with cord flattening likely accentuated by the edema. Electronically Signed   By: Monte Fantasia M.D.   On: 05/22/2019 11:30   Mr Thoracic Spine W Wo Contrast  Result Date: 05/22/2019 CLINICAL DATA:  Metastatic disease. EXAM: MRI CERVICAL AND THORACIC SPINE WITHOUT AND WITH CONTRAST TECHNIQUE: Multiplanar and multiecho pulse sequences of the cervical and thoracic spine were obtained without and with intravenous contrast. CONTRAST:  8 cc Gadavist intravenous COMPARISON:  Thoracic MRI from 6 days ago. FINDINGS: Cervical spine: Alignment: Normal Vertebrae: Metastatic infiltration in the C2 spinous process, C4 body, C5 body, and nearly a confluent involvement of the C6-T2 vertebrae involving bodies and posterior elements. No pathologic fracture. There is epidural tumor extension along the right aspect of the canal at T1-2, which replaces the right foramen and contacts the cord. Cord: Ovoid mass within the cervical cord at the level of C7, 17 x 11 mm. Cord edema extends cranially and inferiorly throughout the cervical and upper thoracic cord. It is unclear if this is direct spread from the right foraminal disease at T1-2 or a hematogenous metastasis. Posterior Fossa, vertebral arteries, paraspinal tissues: Known adenopathy at the thoracic inlet. Disc levels: C4-5 small central disc protrusion with mild cord deformity. C5-6 disc narrowing with bulging and ridging causing biforaminal impingement and ventral cord flattening. C6-7 left paracentral protrusion and uncovertebral ridging left foraminal impingement. Central disc herniation flattens the ventral cord. Thoracic spine: Alignment: Normal Vertebrae: Innumerable  metastases seen at every level within most confluent disease seen in the T1, T2, T8, T9, T11, and T12 vertebral bodies. Epidural extension in the right canal and foramen at T1-2 is noted above. No other epidural metastases are  seen. Cord: Cord edema related to cervical spine deposit. There is a 4 mm enhancing nodule along the upper right cauda equina at the level of L1. Paraspinal tissues: Known mediastinal/left hilar adenopathy/mass. Disc levels: Spondylosis with bridging anterior osteophytes. There are small posterior disc herniations without cord compression. IMPRESSION: 1. 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 Extensive vasogenic edema in the cord. 2. 4 mm nodule along the upper right cauda equina at the level of L1, which may indicate subarachnoid tumor spread. 3. Innumerable metastases throughout the cervical and thoracic spine most notable at T1-2 where there is right-sided epidural and foraminal infiltration. 4. C5-6 and C6-7 degenerative spinal stenosis with cord flattening likely accentuated by the edema. Electronically Signed   By: Monte Fantasia M.D.   On: 05/22/2019 11:30   Ct Abdomen Pelvis W Contrast  Result Date: 05/20/2019 CLINICAL DATA:  Metastatic small cell carcinoma EXAM: CT CHEST, ABDOMEN, AND PELVIS WITH CONTRAST TECHNIQUE: Multidetector CT imaging of the chest, abdomen and pelvis was performed following the standard protocol during bolus administration of intravenous contrast. CONTRAST:  135mL OMNIPAQUE IOHEXOL 300 MG/ML  SOLN COMPARISON:  MRI 05/16/2019, 824 2020, CT chest abdomen pelvis 03/03/2019, 12/01/2018 FINDINGS: CT CHEST FINDINGS Cardiovascular: Nonaneurysmal aorta. Mild aortic atherosclerosis. Coronary vascular calcification. Normal heart size. Small pericardial effusion. Tumor in case mint of left upper lobe pulmonary arterial vessels which appears significantly narrowed. Mediastinum/Nodes: Incompletely visualized left supraclavicular nodes. Progression of mediastinal  adenopathy, now with multiple bulky enlarged lymph nodes. Right upper paratracheal lymph node measures 17 mm, not previously measured. Right paratracheal lymph node, series 2, image number 20 measures 25 mm. Substantially increased AP window node, measuring 23 mm, compared with 13 mm previously. Multiple additional paratracheal and precarinal nodes. Esophagus within normal limits. Lungs/Pleura: Irregular left upper lobe lung mass, significant progression compared to prior exam, contiguous with bulky left hilar/suprahilar mass. This measures approximately 5.6 cm AP by 4.6 transverse by 5 cm craniocaudad though precise measurements difficult as the mass is contiguous with mediastinal adenopathy. Interim development of small left-sided pleural effusion. 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 Musculoskeletal: Known widespread skeletal metastatic disease. Partially visualized sclerosis at C7 with possible pathologic fracture at the junction of the lamina with the spinous process. Sclerotic metastatic lesion at right T2 vertebral body and posterior elements with pathologic superior endplate fracture at T2. Metastatic lesions at T9, T10 and T11. New soft tissue mass surrounding the right ninth posterior rib, consistent with metastatic disease. CT ABDOMEN PELVIS FINDINGS Hepatobiliary: Innumerable hypodense metastatic lesions within the liver, progressed compared to prior. A dominant left lobe lesion measures 7.2 cm by 5.7 cm, and may correspond to a previously measured lesion in the region which measured 3 x 18 mm. The gallbladder is contracted. No calcified stone. No biliary dilatation. Pancreas: Unremarkable. No pancreatic ductal dilatation or surrounding inflammatory changes. Spleen: Normal in size without focal abnormality. Adrenals/Urinary Tract: Progressed nodular thickening of the left greater than right adrenal glands, also concerning for  progression of disease. Kidneys show no hydronephrosis. Subcentimeter hypodensity in the mid right kidney, too small to further characterize. The bladder is normal Stomach/Bowel: The stomach is nonenlarged. No dilated small bowel. No colon wall thickening. Vascular/Lymphatic: Moderate aortic atherosclerosis without aneurysm. Numerous enlarged porta hepatis, peripancreatic, portal caval and aortocaval nodes. Enlarged Peri aortic lymph nodes, measuring up to 2 cm, all progressed compared to prior. Reproductive: Prostate is unremarkable. Other: Negative for free air or  free fluid. Small within the inguinal canals. Musculoskeletal: Stable sclerotic focus superior endplate L5. Stable faint sclerosis in the left iliac bone, adjacent lytic lesion is evident. IMPRESSION: 1. Large irregular left upper lobe mass, extending into the left hilar and suprahilar regions, consistent with disease recurrence. The mass invades the mediastinum and there is encasement of the left pulmonary arterial vessels by the soft tissue mass. 2. Significant progression of metastatic disease as evidence by worsened mediastinal adenopathy, hepatic metastatic disease, and upper abdominal and retroperitoneal adenopathy. Increased nodularity of the left greater than right adrenal glands, also concerning for progression of metastatic disease. 3. New small left pleural effusion, with interim finding of multiple left pleural based nodules, consistent with pleural metastatic disease. 4. Skeletal metastatic disease with spinal lesions better seen on MRI. Pathologic superior endplate fracture at T2. New soft tissue mass surrounding the right posterior ninth rib, also consistent with metastatic disease. Electronically Signed   By: Donavan Foil M.D.   On: 05/20/2019 23:52    ASSESSMENT & PLAN:   63 y.o. male with  1. 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 2. Mild chronic small vessel ischemic disease."   2.  Abnormal liver function tests.  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  Bump in bilirubin could be from progression of his liver mets.   12/18/18 US Abdomen revealed Widespread metastatic disease throughout the liver. The liver has a somewhat nodular contour suggesting underlying cirrhosis. 2. No gallstones seen. Gallbladder wall appears mildly thickened and edematous. Sludge is noted in the gallbladder. There is also prominence of the common bile duct without mass or calculus evident. These findings raise concern for potential degree of acalculus cholecystitis. In this regard, it may be prudent to consider nuclear medicine hepatobiliary imaging study to assess for cystic duct patency.  01/02/19 NM Hepatobiliary including GB revealed No biliary obstruction. 2. Normal hepatobiliary scan.  S/p 4 cycles of Carboplatin AUC of 5, 50mg /m2 Etoposide, and Atezolizumab at this time.  03/03/19 CT C/A/P revealed "Substantial partial treatment response as detailed below. No new or progressive metastatic disease. 2. Central left upper lobe lung mass is substantially decreased in size. 3. Mediastinal adenopathy is substantially decreased, with residual mild AP window adenopathy. Retroperitoneal adenopathy has resolved. 4. Liver metastases are decreased. 5. Stable faintly sclerotic osseous lesions in the spine and left iliac wing. 6. Stable irregular thickening of the adrenal glands without discrete adrenal nodules. 7. Aortic Atherosclerosis."  Previously discussed the NCCN guidelines of 4-6 cycles of treatment, followed by maintenance immunotherapy. Pt's disease has responded well, and discussed risks vs benefits of completing 1-2 additional cycles (pt has completed 4 cycles thus far), in setting of patient's liver cirrhosis and palliative treatment. Discussed patient's goals of care. Patient's goals  are to stop chemotherapy treatment after C4, and will focus on spending quality time with friends and family.  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 No significant edema or mass effect."  PLAN: -Discussed pt labwork today, 06/16/19; all values are WNL except for WBC at 2.3K, PLTs at 59K, Neutro Abs at 1.6K, Lymphs Abs at 0.3K, Sodium at 134, Chloride at 96, Glucose at 104, BUN at 29, AST at 85, ALT at 63, Alkaline Phosphatase at 197. -Will hold chemotherapy today  -Will not benefit from additional palliative chemotherapy due to declining nutritional and performance status and increasing  burden of symptoms from her underlying aggressive malignancy -Will focus on comfort at this time -Will refer to home hospice  -Will see back as needed   FOLLOW UP: RTC with dr Irene Limbo as needed Patient being referred to Home hospice  The total time spent in the appt was 25 minutes and more than 50% was on counseling and direct patient cares.  All of the patient's questions were answered with apparent satisfaction. The patient knows to call the clinic with any problems, questions or concerns.   Sullivan Lone MD [place] AAHIVMS [facility] Hematology/Oncology Physician [facility]  (Office):       [phone] (Work cell):  [phone] (Fax):           [phone]  I, Yevette Edwards, am acting as a scribe for Dr. Sullivan Lone.   .I have reviewed the above documentation for accuracy and completeness, and I agree with the above. Brunetta Genera MD

## 2019-06-16 ENCOUNTER — Telehealth: Payer: Self-pay | Admitting: *Deleted

## 2019-06-16 ENCOUNTER — Telehealth: Payer: Self-pay | Admitting: Hematology

## 2019-06-16 ENCOUNTER — Other Ambulatory Visit: Payer: Self-pay

## 2019-06-16 ENCOUNTER — Inpatient Hospital Stay: Payer: Medicaid Other

## 2019-06-16 ENCOUNTER — Inpatient Hospital Stay (HOSPITAL_BASED_OUTPATIENT_CLINIC_OR_DEPARTMENT_OTHER): Payer: Medicaid Other | Admitting: Hematology

## 2019-06-16 VITALS — BP 97/69 | HR 98 | Temp 98.0°F | Resp 18 | Ht 70.0 in | Wt 145.0 lb

## 2019-06-16 DIAGNOSIS — C3490 Malignant neoplasm of unspecified part of unspecified bronchus or lung: Secondary | ICD-10-CM

## 2019-06-16 DIAGNOSIS — C7931 Secondary malignant neoplasm of brain: Secondary | ICD-10-CM

## 2019-06-16 DIAGNOSIS — E43 Unspecified severe protein-calorie malnutrition: Secondary | ICD-10-CM

## 2019-06-16 DIAGNOSIS — Z7189 Other specified counseling: Secondary | ICD-10-CM

## 2019-06-16 DIAGNOSIS — Z5111 Encounter for antineoplastic chemotherapy: Secondary | ICD-10-CM | POA: Diagnosis not present

## 2019-06-16 LAB — CBC WITH DIFFERENTIAL/PLATELET
Abs Immature Granulocytes: 0 10*3/uL (ref 0.00–0.07)
Basophils Absolute: 0 10*3/uL (ref 0.0–0.1)
Basophils Relative: 0 %
Eosinophils Absolute: 0 10*3/uL (ref 0.0–0.5)
Eosinophils Relative: 0 %
HCT: 41.5 % (ref 39.0–52.0)
Hemoglobin: 14 g/dL (ref 13.0–17.0)
Immature Granulocytes: 0 %
Lymphocytes Relative: 13 %
Lymphs Abs: 0.3 10*3/uL — ABNORMAL LOW (ref 0.7–4.0)
MCH: 31.9 pg (ref 26.0–34.0)
MCHC: 33.7 g/dL (ref 30.0–36.0)
MCV: 94.5 fL (ref 80.0–100.0)
Monocytes Absolute: 0.4 10*3/uL (ref 0.1–1.0)
Monocytes Relative: 16 %
Neutro Abs: 1.6 10*3/uL — ABNORMAL LOW (ref 1.7–7.7)
Neutrophils Relative %: 71 %
Platelets: 59 10*3/uL — ABNORMAL LOW (ref 150–400)
RBC: 4.39 MIL/uL (ref 4.22–5.81)
RDW: 12.2 % (ref 11.5–15.5)
WBC: 2.3 10*3/uL — ABNORMAL LOW (ref 4.0–10.5)
nRBC: 0 % (ref 0.0–0.2)

## 2019-06-16 LAB — CMP (CANCER CENTER ONLY)
ALT: 63 U/L — ABNORMAL HIGH (ref 0–44)
AST: 85 U/L — ABNORMAL HIGH (ref 15–41)
Albumin: 3.5 g/dL (ref 3.5–5.0)
Alkaline Phosphatase: 197 U/L — ABNORMAL HIGH (ref 38–126)
Anion gap: 15 (ref 5–15)
BUN: 29 mg/dL — ABNORMAL HIGH (ref 8–23)
CO2: 23 mmol/L (ref 22–32)
Calcium: 9.2 mg/dL (ref 8.9–10.3)
Chloride: 96 mmol/L — ABNORMAL LOW (ref 98–111)
Creatinine: 0.74 mg/dL (ref 0.61–1.24)
GFR, Est AFR Am: >60 mL/min (ref >60)
GFR, Estimated: >60 mL/min (ref >60)
Glucose, Bld: 104 mg/dL — ABNORMAL HIGH (ref 70–99)
Potassium: 4.1 mmol/L (ref 3.5–5.1)
Sodium: 134 mmol/L — ABNORMAL LOW (ref 135–145)
Total Bilirubin: 1.2 mg/dL (ref 0.3–1.2)
Total Protein: 7.2 g/dL (ref 6.5–8.1)

## 2019-06-16 NOTE — Telephone Encounter (Signed)
Dr. Irene Limbo requested referral placed for patient to hospice. Contacted Manufacturing engineer at [phone]. Referral given to [facility] with patient contact/demographic information. They will contact patient.

## 2019-06-16 NOTE — Telephone Encounter (Signed)
Per 9/22 los RTC with dr Irene Limbo as needed Patient being referred to Home hospice

## 2019-06-17 NOTE — Progress Notes (Signed)
  Radiation Oncology         [phone] ________________________________  Name: Cody Montoya MRN: [id number]  Date: 05/12/2019  DOB: [birth year]  End of Treatment Note  Diagnosis:   brain metastasis   Indication for treatment::  palliative       Radiation treatment dates:   04/29/19 - 05/12/19  Site/dose:   The patient was treated with a course of whole brain radiation therapy to a dose of 30 Gy in 10 fractions.  This was accomplished using a 2 field technique with additional reduced fields as necessary to improve dose homogeneity.  Narrative: The patient tolerated radiation treatment relatively well.   No unexpected difficulties in terms of acute toxicity.  The skin held up to treatment fairly well.  Plan: The patient has completed radiation treatment. The patient will return to radiation oncology clinic for routine followup in one month. I advised the patient to call or return sooner if they have any questions or concerns related to their recovery or treatment. ________________________________  Jodelle Gross, M.D., Ph.D.

## 2019-06-23 ENCOUNTER — Ambulatory Visit: Payer: Self-pay

## 2019-06-23 ENCOUNTER — Other Ambulatory Visit: Payer: Self-pay

## 2019-07-03 ENCOUNTER — Telehealth: Payer: Self-pay | Admitting: *Deleted

## 2019-07-03 NOTE — Telephone Encounter (Signed)
Received fax notification from [facility] on call that pt died on 2019/07/29. Pt had [facility] hospice services and that nurse was present at the time of death

## 2019-07-07 ENCOUNTER — Telehealth: Payer: Self-pay | Admitting: Radiation Oncology

## 2019-07-07 NOTE — Telephone Encounter (Signed)
Opened in error

## 2019-07-26 DEATH — deceased

## 2019-08-12 NOTE — Progress Notes (Signed)
  Radiation Oncology         [phone] ________________________________  Name: Masaru Chamberlin MRN: [id number]  Date: 06/09/2019  DOB: [birth year]  End of Treatment Note  Diagnosis:   brain metastasis; bone metastasis  Indication for treatment::  palliative       Radiation treatment dates:   04/29/19 - 06/09/19  Site/dose:    1.  The patient was treated with a course of whole brain radiation therapy to a dose of 30 Gy in 10 fractions.  This was accomplished using a 2 field technique with additional reduced fields as necessary to improve dose homogeneity.  2. The C-spine centered at C7 was treated with a course of radiation therapy to a dose of 30 Gy in 10 fractions.  This was accomplished using a 2 field technique.  3.  The L-spine centered at [place] treated with a course of radiation therapy to a dose of 30 Gy in 10 fractions.  This was accomplished using a 2 field technique  Narrative: The patient tolerated radiation treatment relatively well.   No unexpected difficulties in terms of acute toxicity.  The skin held up to treatment fairly well.  Plan: The patient has completed radiation treatment. The patient will return to radiation oncology clinic for routine followup in one month. I advised the patient to call or return sooner if they have any questions or concerns related to their recovery or treatment. ________________________________  Jodelle Gross, M.D., Ph.D.

## 2020-04-18 IMAGING — MR MRI THORACIC SPINE WITHOUT CONTRAST
4 series · 19 of 48 positions shown · non-contrast
Comparison: Chest CT 12/01/2018

CLINICAL DATA: Metastatic disease of thoracic spine.

EXAM:
MRI THORACIC SPINE WITHOUT CONTRAST
TECHNIQUE: Multiplanar, multisequence MR imaging of the thoracic spine was
performed. No intravenous contrast was administered.

[Series 14: T1 · sagittal · 6.0mm · 1.09mm/px · 5 of 9 slices shown (1 of 2)]
[im 1/9]
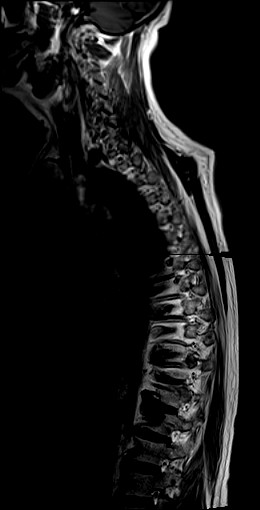
[im 2/9]
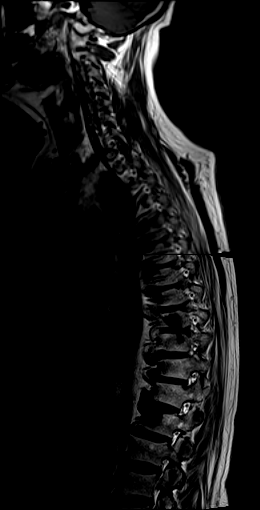
[im 4/9]
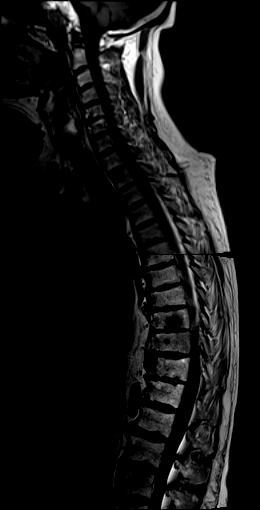
[im 5/9]
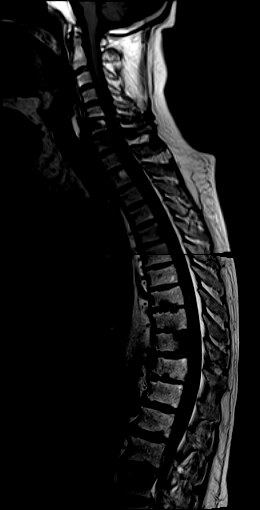
[im 9/9]
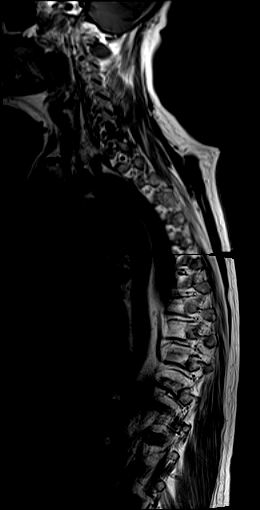

[Series 15: T2 · sagittal · 3.0mm · 0.76mm/px · 8 of 21 slices shown]
[im 1/21]
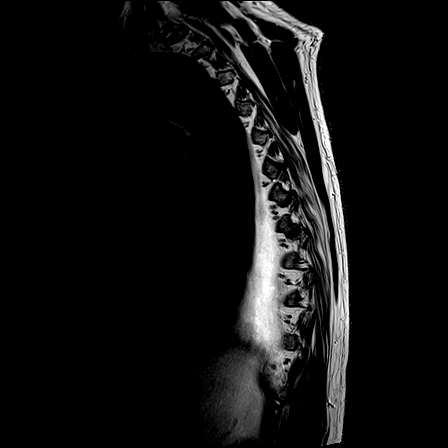
[im 4/21]
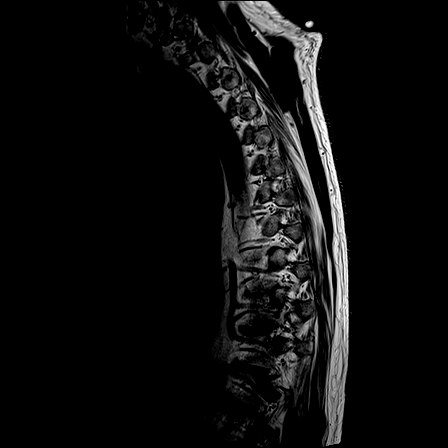
[im 7/21]
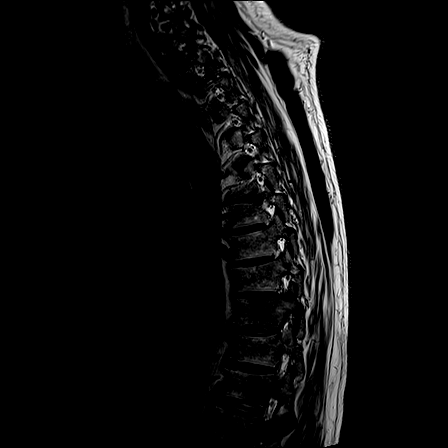
[im 10/21]
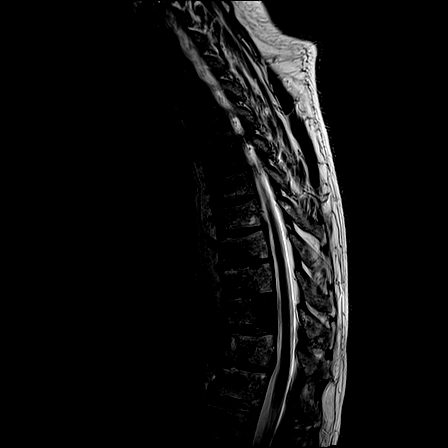
[im 11/21]
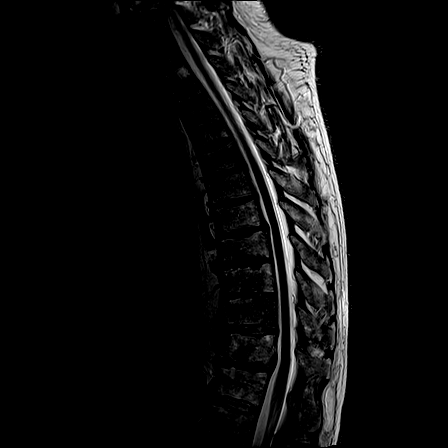
[im 14/21]
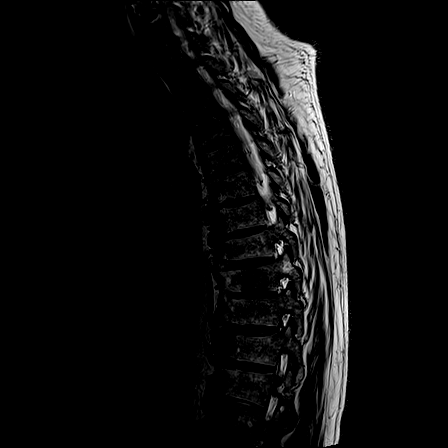
[im 17/21]
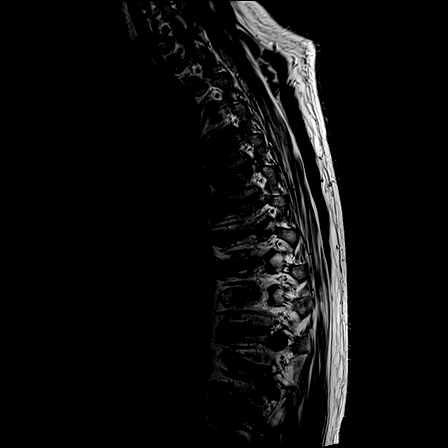
[im 21/21]
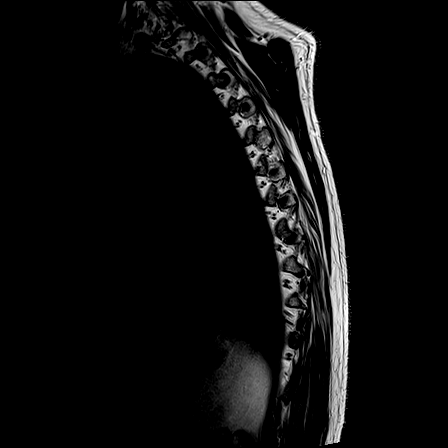

[Series 16: T1 · sagittal · 3.0mm · 0.76mm/px · 3 of 21 slices shown (2 of 2)]
[im 4/21]
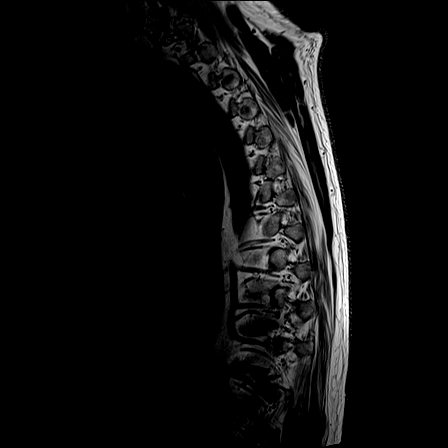
[im 11/21]
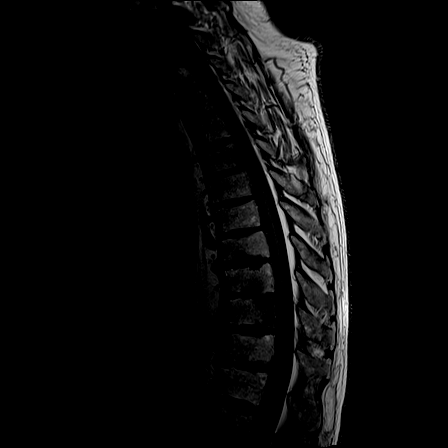
[im 17/21]
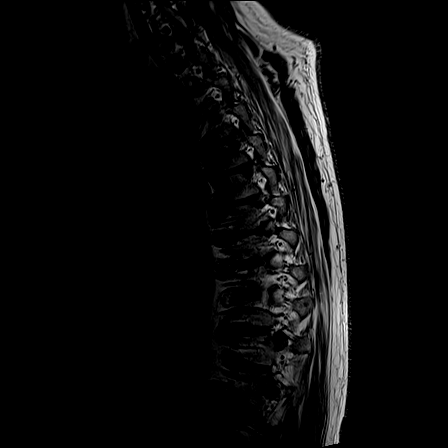

[Series 17: STIR · sagittal · 3.0mm · 0.38mm/px · 3 of 21 slices shown]
[im 4/21]
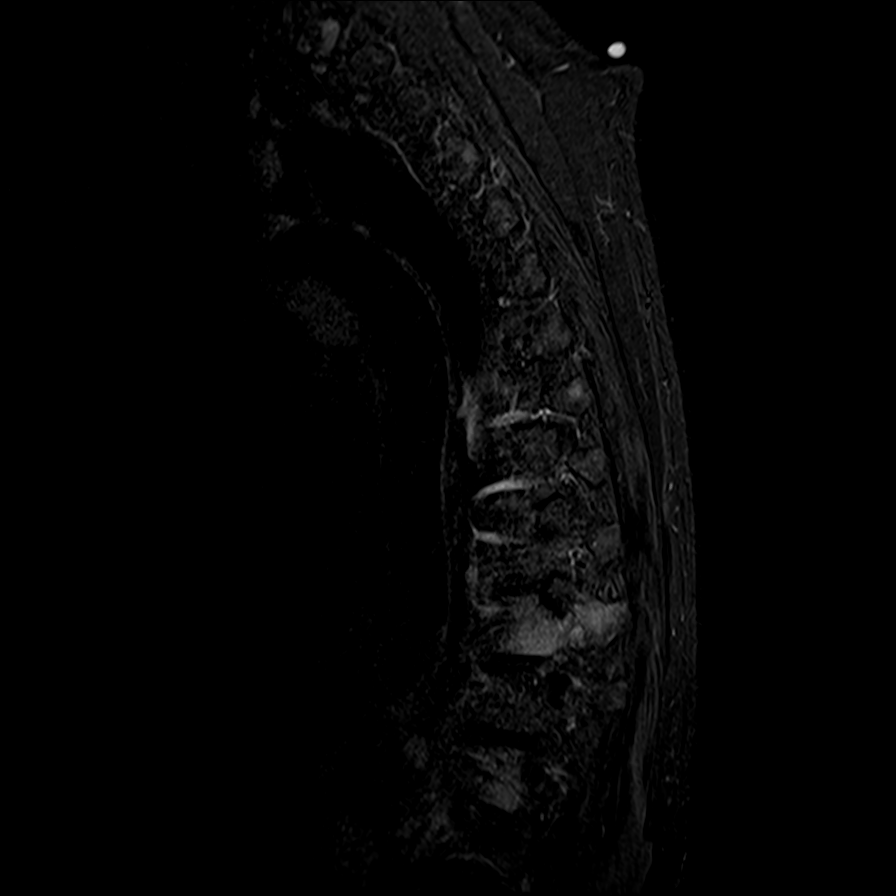
[im 11/21]
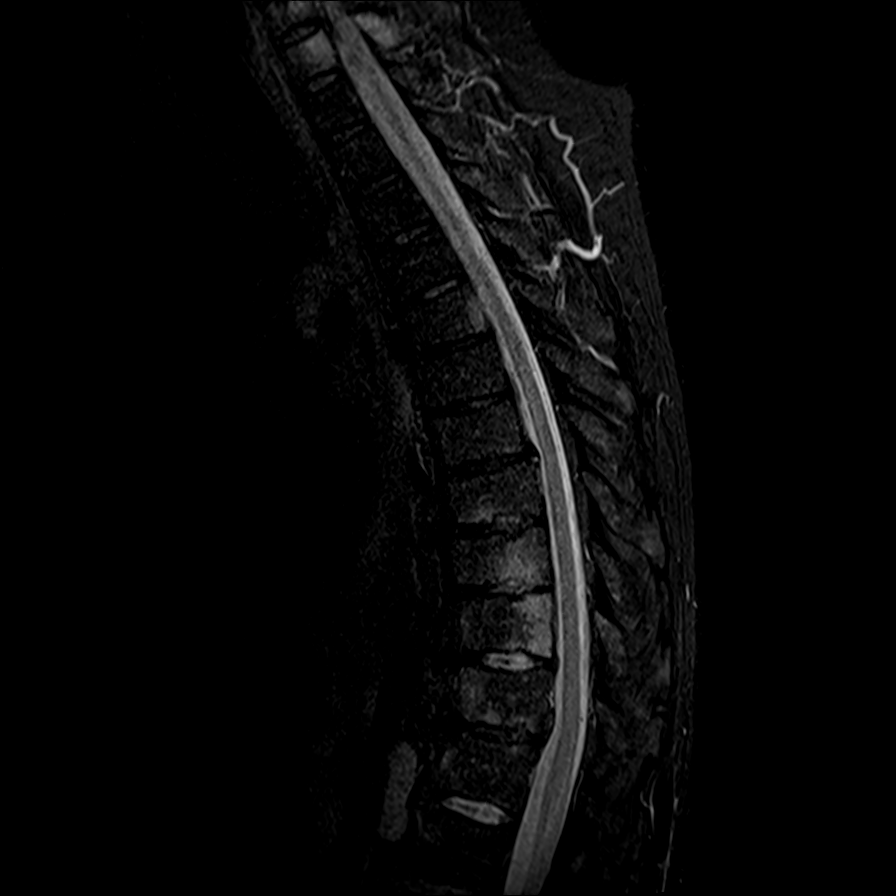
[im 17/21]
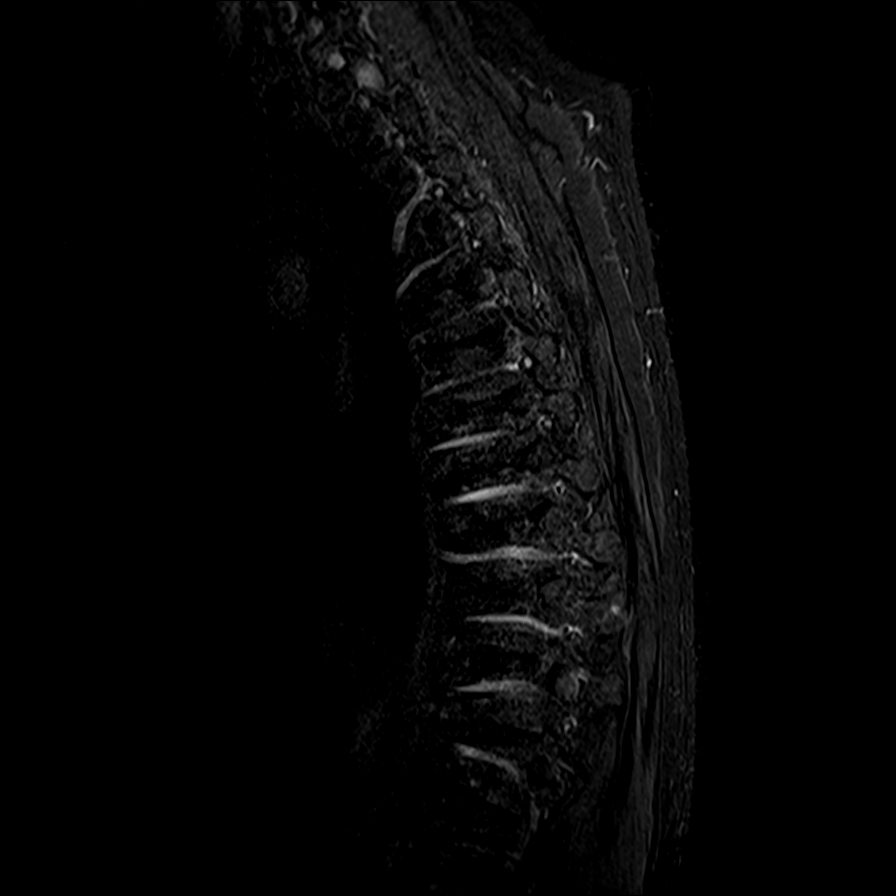

[19 of 48 positions shown; findings below may reference images not displayed]

FINDINGS: The examination had to be discontinued prior to completion due to
patient pain following liver biopsy. Only sagittal sequences were
obtained.

Alignment:  Normal

Vertebrae: There are numerous marrow lesions of the visualized
cervical spine and thoracic spine. In the incompletely visualized
cervical spine, there are lesions at C6 and C7. The C7 lesion
extends into the left pedicle and posterior elements.

In the thoracic spine there are lesions at T2 and at all levels from
T5-T12. There is also a lesion at L1. Most of the lesions are
confined to the vertebral body. The lesion at T10 extends into the
left pedicle and lamina. Smaller lesions at T8 and T9 also extend
into the left pedicles. There is a ventral epidural component of the
T10 tumor.

Cord:  Normal signal and caliber.

Paraspinal and other soft tissues: Thoracic soft tissues are better
assessed on the concomitant chest CT.

Disc levels:

Limited assessment due to absence of axial imaging. No spinal canal
stenosis.
IMPRESSION: 1. Extensive osseous metastatic disease of the thoracic spine
without pathologic fracture.
2. The largest lesion is located at T10, where there is also a
component of ventral epidural tumor.
3. No spinal canal stenosis.

## 2020-04-18 IMAGING — CT CT CHEST WITH CONTRAST
2 of 3 series · 14 of 36 positions shown, 17 images · IV contrast (APPLIED)
Comparison: None.

CLINICAL DATA: Staging for new liver neoplasm found on CT

EXAM:
CT CHEST WITH CONTRAST
TECHNIQUE: Multidetector CT imaging of the chest was performed during
intravenous contrast administration.
CONTRAST:  75mL OMNIPAQUE IOHEXOL 300 MG/ML  SOLN

[Series 3: thorax 2.0 i31f 2 · axial · 0.74mm/px · z∈[-55,+197]mm · 11 of 150 slices shown, 14 images]
[im 12/150  mediastinal]
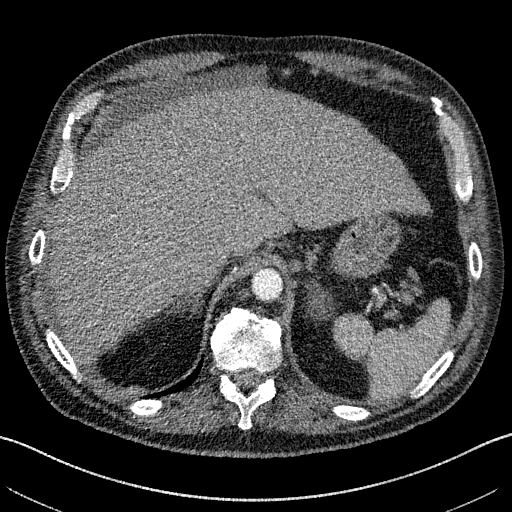
[im 12/150  lung]
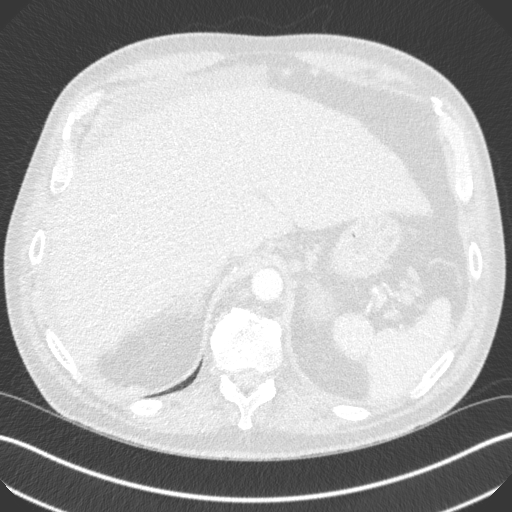
[im 23/150  lung]
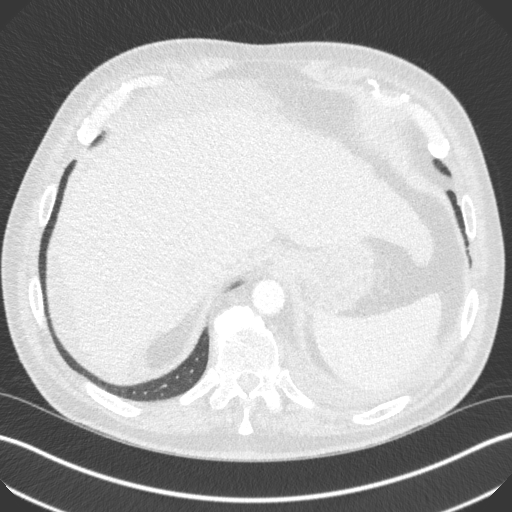
[im 34/150  lung]
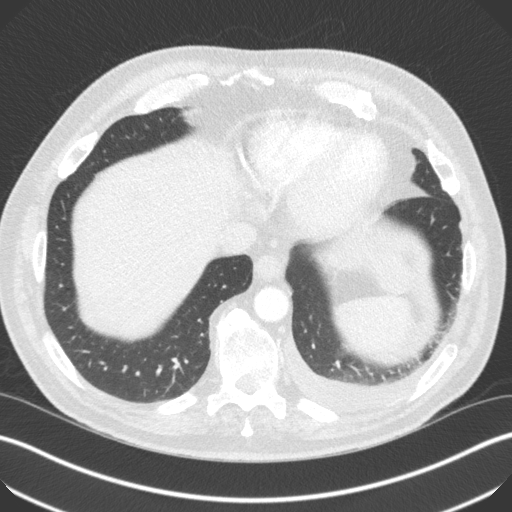
[im 50/150  lung]
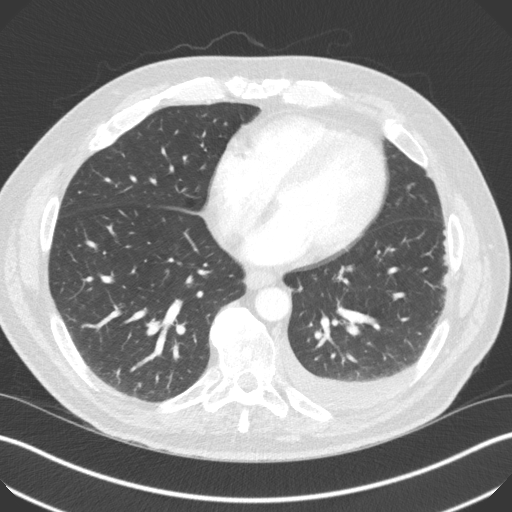
[im 61/150  mediastinal]
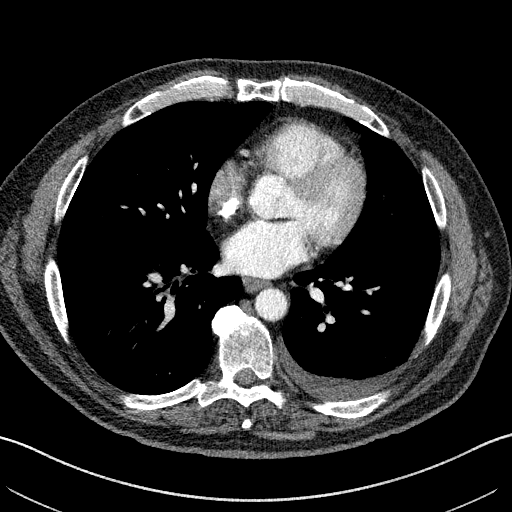
[im 61/150  lung]
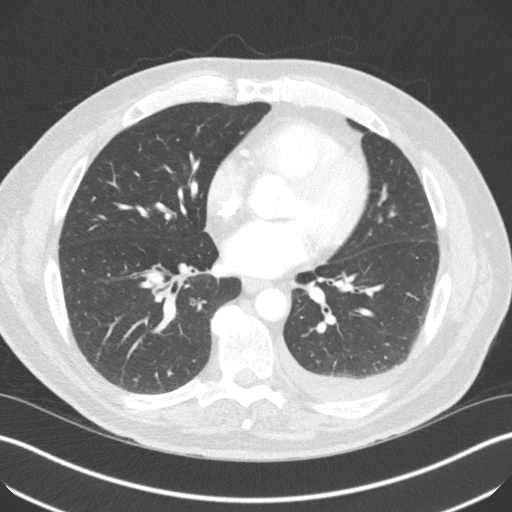
[im 78/150  lung]
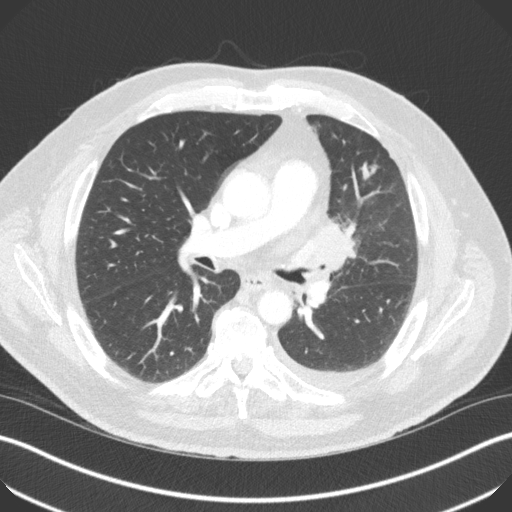
[im 89/150  lung]
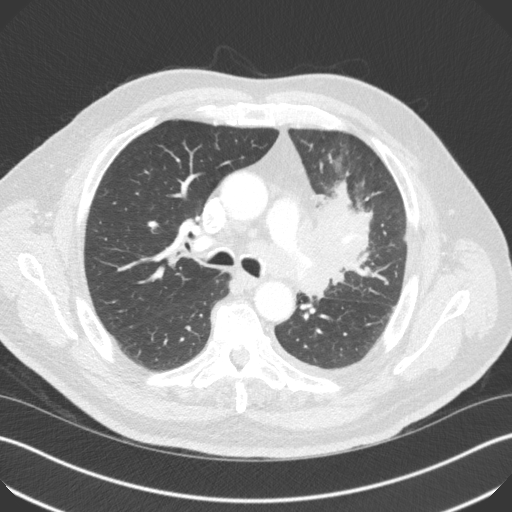
[im 100/150  lung]
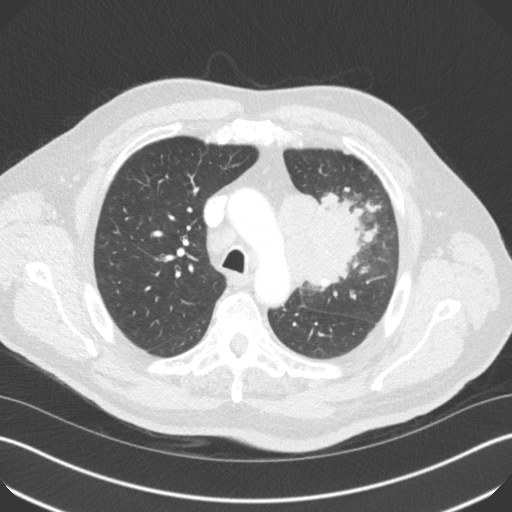
[im 116/150  mediastinal]
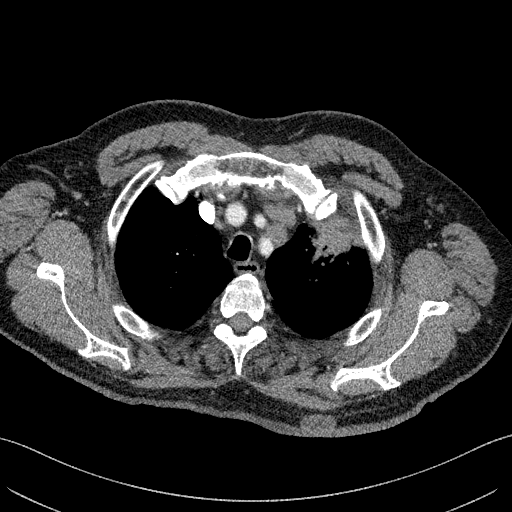
[im 116/150  lung]
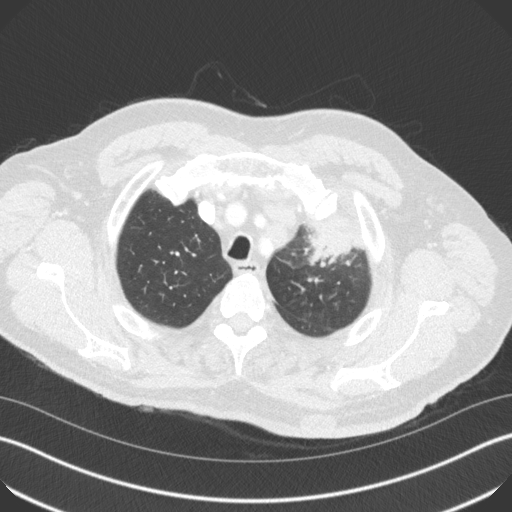
[im 127/150  lung]
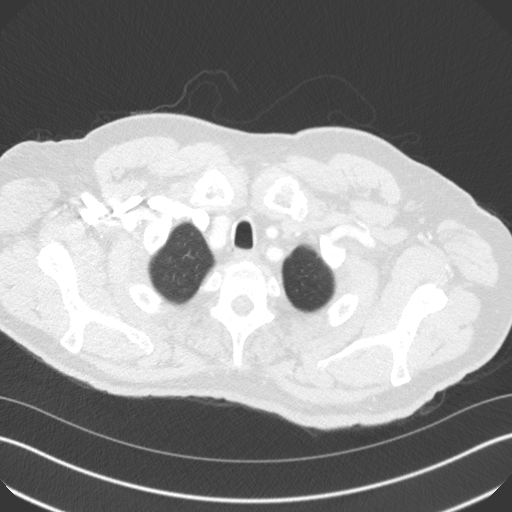
[im 138/150  lung]
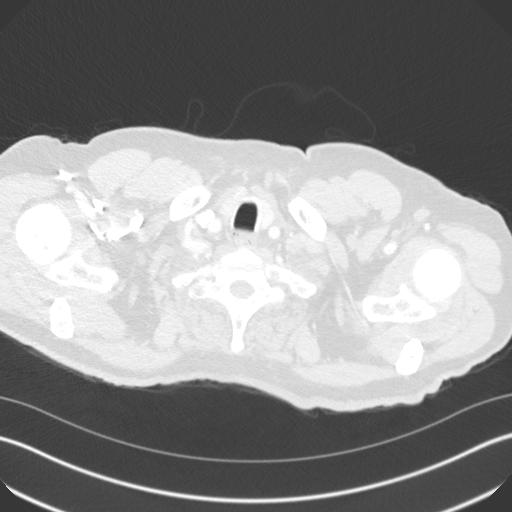

[Series 5: coronal · coronal · 0.62mm/px · 3 of 136 slices shown]
[im 28/136  lung]
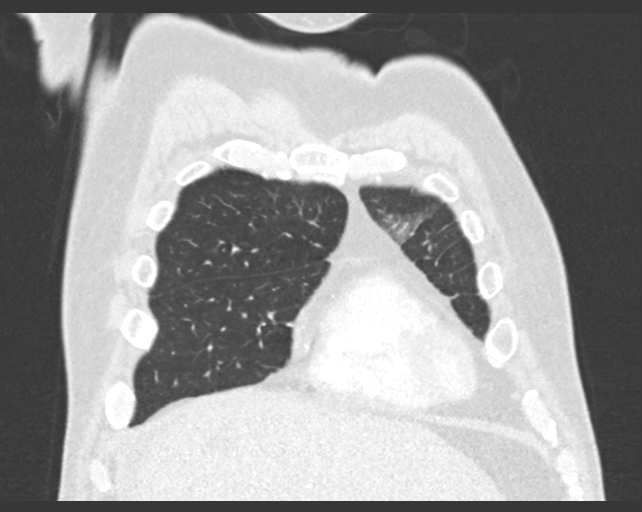
[im 55/136  lung]
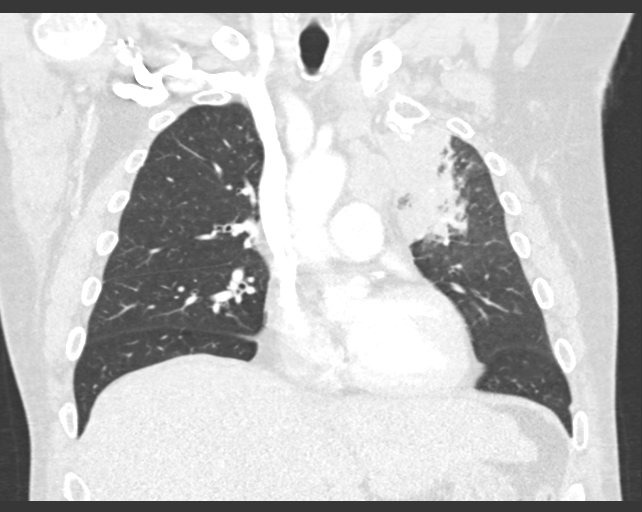
[im 82/136  lung]
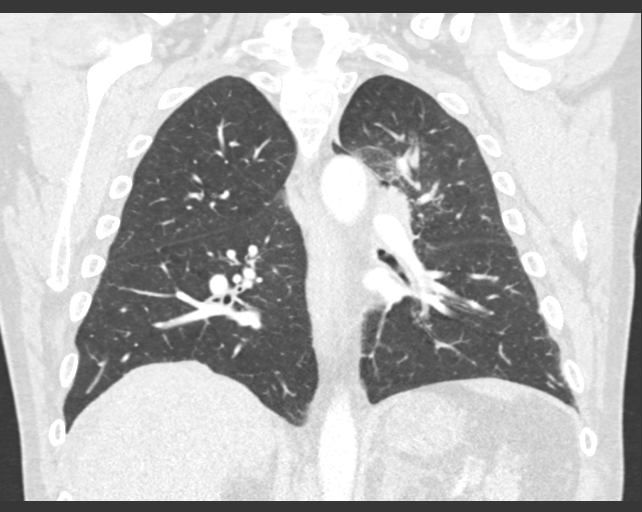

[14 of 36 positions shown; findings below may reference images not displayed]

FINDINGS: Cardiovascular: The heart size appears within normal limits. No
pericardial effusion identified. Mild aortic atherosclerosis. Three
vessel coronary artery atherosclerotic calcifications identified.

Mediastinum/Nodes: Left supraclavicular lymph node is prominent
measuring 1 cm, image [DATE]. Left sided pre-vascular lymph node within
the superior mediastinum measures 1.6 cm, image 33/3. Right
paratracheal lymph node measures 1.8 cm, image 57/3. No subcarinal
or right hilar adenopathy. Low left paratracheal lymph node measures
2.2 cm, image 61/3.

Lungs/Pleura: Small left pleural effusion. Within the medial left
upper lobe there is a paramediastinal mass measuring 6.6 x 6.2 by
10.0 cm. Tumor invades the mediastinum and left hilum. Encasement
and complete occlusion of the left upper lobe bronchus identified.
There is also encasement of the left pulmonary artery. Complete
occlusion of left upper pulmonary vein identified. Subpleural nodule
within the lateral left upper lobe measures 5 mm, multiple small
pleural nodules are identified overlying the left lung compatible
with pleural metastasis, for example image 101/7.

Upper Abdomen: Multiple ill-defined low-density lesions involving
the liver are identified within a background of cirrhosis or pseudo
cirrhosis due to liver metastasis. See report from 11/29/2018 for
details. Again seen is ascites, bilateral adrenal gland enlargement
and abdominal adenopathy.

Musculoskeletal: Chronic bilateral posttraumatic rib deformities are
noted. There is a suspicious lytic lesion involving the approximate
T11 vertebra with extension into the left posterior aspect of the
canal, image 90/6. Here there is a epidural soft tissue component
measuring 1.1 cm, image 90/6., image 118/3.
IMPRESSION: 1. Large left upper lobe lung mass is identified with invasion of
the mediastinum and left hilum. Associated left-sided pleural
metastasis with pleural nodularity and effusion noted. Ipsilateral
hilar and bilateral mediastinal nodal metastasis. Left
supraclavicular metastasis also noted.
2. Lytic lesion involving the posterior aspect of the approximate
T11 vertebra with suspected epidural tumor involvement noted. Advise
further evaluation with contrast enhanced MRI of the thoracic spine.
3. Upper abdominal metastasis as described on 11/29/2018.
4. These results will be called to the ordering clinician or
representative by the Radiologist Assistant, and communication
documented in the PACS or zVision Dashboard.
